# Patient Record
Sex: Female | Born: 1980
Health system: Southern US, Community
[De-identification: ages and names within clinical notes are randomized; demographics above are authoritative.]

## PROBLEM LIST (undated history)

## (undated) DIAGNOSIS — R7303 Prediabetes: Secondary | ICD-10-CM

## (undated) DIAGNOSIS — G43909 Migraine, unspecified, not intractable, without status migrainosus: Secondary | ICD-10-CM

## (undated) DIAGNOSIS — F4310 Post-traumatic stress disorder, unspecified: Secondary | ICD-10-CM

## (undated) DIAGNOSIS — N390 Urinary tract infection, site not specified: Secondary | ICD-10-CM

## (undated) DIAGNOSIS — L719 Rosacea, unspecified: Secondary | ICD-10-CM

## (undated) DIAGNOSIS — F419 Anxiety disorder, unspecified: Secondary | ICD-10-CM

## (undated) DIAGNOSIS — N809 Endometriosis, unspecified: Secondary | ICD-10-CM

## (undated) DIAGNOSIS — Z87442 Personal history of urinary calculi: Secondary | ICD-10-CM

## (undated) DIAGNOSIS — F32A Depression, unspecified: Secondary | ICD-10-CM

## (undated) DIAGNOSIS — Q793 Gastroschisis: Secondary | ICD-10-CM

## (undated) DIAGNOSIS — R112 Nausea with vomiting, unspecified: Secondary | ICD-10-CM

## (undated) DIAGNOSIS — N879 Dysplasia of cervix uteri, unspecified: Secondary | ICD-10-CM

## (undated) DIAGNOSIS — F53 Postpartum depression: Secondary | ICD-10-CM

## (undated) DIAGNOSIS — Z87898 Personal history of other specified conditions: Secondary | ICD-10-CM

## (undated) DIAGNOSIS — F429 Obsessive-compulsive disorder, unspecified: Secondary | ICD-10-CM

## (undated) DIAGNOSIS — Z9889 Other specified postprocedural states: Secondary | ICD-10-CM

## (undated) DIAGNOSIS — L409 Psoriasis, unspecified: Secondary | ICD-10-CM

## (undated) DIAGNOSIS — N200 Calculus of kidney: Secondary | ICD-10-CM

## (undated) DIAGNOSIS — O99345 Other mental disorders complicating the puerperium: Secondary | ICD-10-CM

## (undated) DIAGNOSIS — Z8701 Personal history of pneumonia (recurrent): Secondary | ICD-10-CM

## (undated) DIAGNOSIS — J189 Pneumonia, unspecified organism: Secondary | ICD-10-CM

## (undated) DIAGNOSIS — G4730 Sleep apnea, unspecified: Secondary | ICD-10-CM

## (undated) DIAGNOSIS — T7840XA Allergy, unspecified, initial encounter: Secondary | ICD-10-CM

## (undated) DIAGNOSIS — R011 Cardiac murmur, unspecified: Secondary | ICD-10-CM

## (undated) DIAGNOSIS — F329 Major depressive disorder, single episode, unspecified: Secondary | ICD-10-CM

## (undated) DIAGNOSIS — T8859XA Other complications of anesthesia, initial encounter: Secondary | ICD-10-CM

## (undated) DIAGNOSIS — B002 Herpesviral gingivostomatitis and pharyngotonsillitis: Secondary | ICD-10-CM

## (undated) HISTORY — DX: Personal history of other specified conditions: Z87.898

## (undated) HISTORY — DX: Calculus of kidney: N20.0

## (undated) HISTORY — DX: Psoriasis, unspecified: L40.9

## (undated) HISTORY — DX: Other mental disorders complicating the puerperium: O99.345

## (undated) HISTORY — DX: Personal history of pneumonia (recurrent): Z87.01

## (undated) HISTORY — DX: Rosacea, unspecified: L71.9

## (undated) HISTORY — DX: Cardiac murmur, unspecified: R01.1

## (undated) HISTORY — DX: Herpesviral gingivostomatitis and pharyngotonsillitis: B00.2

## (undated) HISTORY — DX: Dysplasia of cervix uteri, unspecified: N87.9

## (undated) HISTORY — DX: Endometriosis, unspecified: N80.9

## (undated) HISTORY — DX: Allergy, unspecified, initial encounter: T78.40XA

## (undated) HISTORY — DX: Migraine, unspecified, not intractable, without status migrainosus: G43.909

## (undated) HISTORY — PX: GASTROSCHISIS CLOSURE: SHX1700

## (undated) HISTORY — DX: Urinary tract infection, site not specified: N39.0

## (undated) HISTORY — PX: WISDOM TOOTH EXTRACTION: SHX21

## (undated) HISTORY — DX: Anxiety disorder, unspecified: F41.9

## (undated) HISTORY — DX: Gastroschisis: Q79.3

## (undated) HISTORY — PX: LEEP: SHX91

## (undated) HISTORY — DX: Depression, unspecified: F32.A

## (undated) HISTORY — DX: Postpartum depression: F53.0

---
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
## 2009-04-16 HISTORY — PX: ENDOMETRIAL BIOPSY: SHX622

## 2010-04-16 HISTORY — PX: ABDOMINAL SURGERY: SHX537

## 2016-01-31 DIAGNOSIS — Z8619 Personal history of other infectious and parasitic diseases: Secondary | ICD-10-CM | POA: Insufficient documentation

## 2016-01-31 DIAGNOSIS — Z8742 Personal history of other diseases of the female genital tract: Secondary | ICD-10-CM | POA: Insufficient documentation

## 2016-01-31 DIAGNOSIS — Z87442 Personal history of urinary calculi: Secondary | ICD-10-CM | POA: Insufficient documentation

## 2016-05-30 DIAGNOSIS — F4000 Agoraphobia, unspecified: Secondary | ICD-10-CM | POA: Insufficient documentation

## 2017-05-27 DIAGNOSIS — Z98891 History of uterine scar from previous surgery: Secondary | ICD-10-CM | POA: Insufficient documentation

## 2017-05-27 DIAGNOSIS — B009 Herpesviral infection, unspecified: Secondary | ICD-10-CM | POA: Insufficient documentation

## 2017-05-27 DIAGNOSIS — Z8279 Family history of other congenital malformations, deformations and chromosomal abnormalities: Secondary | ICD-10-CM | POA: Insufficient documentation

## 2017-05-27 DIAGNOSIS — O24410 Gestational diabetes mellitus in pregnancy, diet controlled: Secondary | ICD-10-CM | POA: Insufficient documentation

## 2017-05-27 DIAGNOSIS — G43109 Migraine with aura, not intractable, without status migrainosus: Secondary | ICD-10-CM | POA: Insufficient documentation

## 2017-05-27 DIAGNOSIS — G43009 Migraine without aura, not intractable, without status migrainosus: Secondary | ICD-10-CM | POA: Insufficient documentation

## 2019-01-23 ENCOUNTER — Encounter: Payer: Self-pay | Admitting: Family Medicine

## 2019-01-23 ENCOUNTER — Other Ambulatory Visit: Payer: Self-pay

## 2019-01-23 ENCOUNTER — Ambulatory Visit (INDEPENDENT_AMBULATORY_CARE_PROVIDER_SITE_OTHER): Payer: Managed Care, Other (non HMO) | Admitting: Family Medicine

## 2019-01-23 VITALS — BP 102/68 | HR 101 | Temp 97.5°F | Ht 60.25 in | Wt 191.8 lb

## 2019-01-23 DIAGNOSIS — Z6837 Body mass index (BMI) 37.0-37.9, adult: Secondary | ICD-10-CM

## 2019-01-23 DIAGNOSIS — F419 Anxiety disorder, unspecified: Secondary | ICD-10-CM

## 2019-01-23 DIAGNOSIS — F329 Major depressive disorder, single episode, unspecified: Secondary | ICD-10-CM

## 2019-01-23 DIAGNOSIS — E6609 Other obesity due to excess calories: Secondary | ICD-10-CM

## 2019-01-23 DIAGNOSIS — R61 Generalized hyperhidrosis: Secondary | ICD-10-CM

## 2019-01-23 DIAGNOSIS — Z0000 Encounter for general adult medical examination without abnormal findings: Secondary | ICD-10-CM

## 2019-01-23 DIAGNOSIS — F32A Depression, unspecified: Secondary | ICD-10-CM

## 2019-01-23 DIAGNOSIS — N920 Excessive and frequent menstruation with regular cycle: Secondary | ICD-10-CM

## 2019-01-23 DIAGNOSIS — R7982 Elevated C-reactive protein (CRP): Secondary | ICD-10-CM

## 2019-01-23 DIAGNOSIS — Z8632 Personal history of gestational diabetes: Secondary | ICD-10-CM | POA: Diagnosis not present

## 2019-01-23 DIAGNOSIS — Z23 Encounter for immunization: Secondary | ICD-10-CM

## 2019-01-23 DIAGNOSIS — E559 Vitamin D deficiency, unspecified: Secondary | ICD-10-CM

## 2019-01-23 LAB — HEMOGLOBIN A1C: Hgb A1c MFr Bld: 6.2 % (ref 4.6–6.5)

## 2019-01-23 LAB — COMPREHENSIVE METABOLIC PANEL
ALT: 15 U/L (ref 0–35)
AST: 13 U/L (ref 0–37)
Albumin: 4.4 g/dL (ref 3.5–5.2)
Alkaline Phosphatase: 68 U/L (ref 39–117)
BUN: 12 mg/dL (ref 6–23)
CO2: 26 mEq/L (ref 19–32)
Calcium: 9.2 mg/dL (ref 8.4–10.5)
Chloride: 104 mEq/L (ref 96–112)
Creatinine, Ser: 0.65 mg/dL (ref 0.40–1.20)
GFR: 102.76 mL/min (ref >60.00)
Glucose, Bld: 104 mg/dL — ABNORMAL HIGH (ref 70–99)
Potassium: 4.3 mEq/L (ref 3.5–5.1)
Sodium: 138 mEq/L (ref 135–145)
Total Bilirubin: 0.4 mg/dL (ref 0.2–1.2)
Total Protein: 7 g/dL (ref 6.0–8.3)

## 2019-01-23 LAB — CBC WITH DIFFERENTIAL/PLATELET
Basophils Absolute: 0 10*3/uL (ref 0.0–0.1)
Basophils Relative: 0.2 % (ref 0.0–3.0)
Eosinophils Absolute: 0.1 10*3/uL (ref 0.0–0.7)
Eosinophils Relative: 0.8 % (ref 0.0–5.0)
HCT: 37.3 % (ref 36.0–46.0)
Hemoglobin: 12.3 g/dL (ref 12.0–15.0)
Lymphocytes Relative: 35.8 % (ref 12.0–46.0)
Lymphs Abs: 3.6 10*3/uL (ref 0.7–4.0)
MCHC: 33.1 g/dL (ref 30.0–36.0)
MCV: 84.6 fl (ref 78.0–100.0)
Monocytes Absolute: 0.6 10*3/uL (ref 0.1–1.0)
Monocytes Relative: 6 % (ref 3.0–12.0)
Neutro Abs: 5.7 10*3/uL (ref 1.4–7.7)
Neutrophils Relative %: 57.2 % (ref 43.0–77.0)
Platelets: 306 10*3/uL (ref 150.0–400.0)
RBC: 4.41 Mil/uL (ref 3.87–5.11)
RDW: 13.2 % (ref 11.5–15.5)
WBC: 9.9 10*3/uL (ref 4.0–10.5)

## 2019-01-23 LAB — FERRITIN: Ferritin: 16.5 ng/mL (ref 10.0–291.0)

## 2019-01-23 LAB — HIGH SENSITIVITY CRP: CRP, High Sensitivity: 8.7 mg/L — ABNORMAL HIGH (ref 0.000–5.000)

## 2019-01-23 LAB — TSH: TSH: 1.69 u[IU]/mL (ref 0.35–4.50)

## 2019-01-23 LAB — VITAMIN D 25 HYDROXY (VIT D DEFICIENCY, FRACTURES): VITD: 20.06 ng/mL — ABNORMAL LOW (ref 30.00–100.00)

## 2019-01-23 NOTE — Patient Instructions (Addendum)
Good to meet you today  Please follow up in 6 months  A resource that I like is [email or web address].com/diabetes/diet  I recommend you check your blood sugar daily and keep a log.  Very the time you check your blood sugar such as fasting, before meal, 2 hours after a meal and at bedtime.  Look for trends with the foods you are eating and be a scientist of your body.  Here are some guidelines to help you with meal planning -  Avoid all processed and packaged foods (bread, pasta, crackers, chips, etc) and beverages containing calories.  Avoid added sugars and excessive natural sugars.  Attention to how you feel if you consume artificial sweeteners.  Do they make you more hungry or raise your blood sugar?  With every meal and snack, aim to get 20 g of protein (3 ounces of meat, 4 ounces of fish, 3 eggs, protein powder, 1 cup [place] yogurt, 1 cup cottage cheese, etc.)  Increase fiber in the form of non-starchy vegetables.  These help you feel full with very little carbohydrates and are good for gut health.  Eat 1 serving healthy carb per meal- 1/2 cup brown rice, beans, potato, corn- pay attention to whether or not this significantly raises your blood sugar. If it does, reduce the frequency you consume these.   Eat 2-3 servings of lower sugar fruits daily.  This includes berries, apples, oranges, peaches, pears, one half banana.  Have small amounts of good fats such as avocado, nuts, olive oil, nut butters, olives.  Add a little cheese to your salads to make them tasty.   How to help anxiety - without medication.   1) Regular Exercise - walking, jogging, cycling, dancing, strength training  2)  Begin a Mindfulness/Meditation practice -- this can take a little as 3 minutes and is helpful for all kinds of mood issues -- You can find resources in books -- Or you can download apps like  ---- Headspace App (which currently has free content called "Weathering the Storm") ---- Calm (which has a few free  options)  ---- Insignt Timer ---- Stop, Breathe & Think  # With each of these Apps - you should decline the "start free trial" offer and as you search through the App should be able to access some of their free content. You can also chose to pay for the content if you find one that works well for you.   # Many of them also offer sleep specific content which may help with insomnia  3) Healthy Diet -- Avoid or decrease Caffeine -- Avoid or decrease Alcohol -- Drink plenty of water, have a balanced diet -- Avoid cigarettes and marijuana (as well as other recreational drugs)  4) Consider contacting a professional therapist

## 2019-01-23 NOTE — Addendum Note (Signed)
Addended by: Virl Cagey on: 01/23/2019 11:04 AM   Modules accepted: Orders

## 2019-01-23 NOTE — Progress Notes (Signed)
Subjective:    Patient ID: Crystal Hammond, Crystal Hammond    DOB: Feb 28, 1981, 38 y.o.   MRN: [id number]  HPI Chief Complaint  Patient presents with  . New Patient (Initial Visit)    Referrals for hormone issues - hot flashes, sweating and mood swings.     This is a 38 yo Crystal Hammond who presents today to establish care. She is accompanied by her toddler daughter. Previously was seen at [facility]. Has 4 children, aged 12 to 64.  Married.  Enjoys taking hot baths.  Last CPE- over 1 year Pap- wants to go to gyn Tdap- Flu- Eye- this year Dental- over due Exercise- household chores Diet- eats a wide variety of foods.  May consider gastric surgery in the future.  Has tried many diets in the past including weight watchers, counting calories and decreasing portions and soda.  Eats dessert at least once a day and always has. Sleep- 6-8 hours, usually interupted  Depression and anxiety- long standing. Dx PMDD. Increased anxiety.  1 of her sons was attacked by a pit bull about 2 years ago and she has PTSD surrounding this event.  She reports that she had been on medication for depression in the past and gained significant amounts of weight that she has never been able to lose.  She is not interested in going on medication at this time.  She has had therapy in the past and found that very helpful and is interested in therapy again.   Snoring and drooling at night. No known apnea.  Thinks this is related to her weight.  Hot flashes- several years. During day and at night.    Past Medical History:  Diagnosis Date  . Anxiety   . Depression   . 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 Heart murmur   . History of febrile seizure   . History of pneumonia   . Migraines   . Recurrent UTI    Past Surgical History:  Procedure Laterality Date  . ABDOMINAL SURGERY  2012   Endometrial Surgery - went through c-section scar  . CESAREAN SECTION  2019  . CESAREAN SECTION  2007  . CESAREAN SECTION  2014  . CESAREAN  SECTION  2016  . GASTROSCHISIS CLOSURE    . WISDOM TOOTH EXTRACTION    No family history on file. Social History   Tobacco Use  . Smoking status: Former Smoker    Packs/day: 0.30    Years: 3.00    Pack years: 0.90    Types: Cigarettes    Quit date: 2013    Years since quitting: 7.7  . Smokeless tobacco: Never Used  Substance Use Topics  . Alcohol use: Yes    Alcohol/week: 2.0 standard drinks    Types: 2 Cans of beer per week  . Drug use: Never    .   Review of Systems  Constitutional: Positive for fatigue.       Sweats  HENT: Positive for drooling (during sleep).   Respiratory: Positive for chest tightness (for long time, she suspects from bronchitis as a child. ) and shortness of breath (with activitiy). Negative for wheezing.   Cardiovascular: Negative.   Gastrointestinal: Positive for diarrhea (with dairy). Negative for abdominal pain and constipation.  Endocrine: Positive for heat intolerance.  Genitourinary: Positive for menstrual problem and vaginal bleeding.  Allergic/Immunologic: Positive for food allergies (lactose intolerant. ).  Neurological: Positive for headaches (history of migraines without aura, usually 2 per month. Photo and phonosensitive. ).  Hematological:  Negative.   Psychiatric/Behavioral: Positive for dysphoric mood and sleep disturbance. The patient is nervous/anxious.        Objective:   Physical Exam Vitals signs reviewed.  Constitutional:      General: She is not in acute distress.    Appearance: Normal appearance. She is obese. She is not ill-appearing, toxic-appearing or diaphoretic.  HENT:     Head: Normocephalic and atraumatic.     Right Ear: External ear normal.     Left Ear: External ear normal.  Eyes:     Conjunctiva/sclera: Conjunctivae normal.  Neck:     Musculoskeletal: Normal range of motion and neck supple. No neck rigidity or muscular tenderness.  Cardiovascular:     Rate and Rhythm: Normal rate and regular rhythm.      Heart sounds: Normal heart sounds.  Pulmonary:     Effort: Pulmonary effort is normal.     Breath sounds: Normal breath sounds.  Chest:     Breasts: Breasts are symmetrical.        Right: Normal.        Left: Normal.  Abdominal:     General: There is no distension.     Palpations: Abdomen is soft. There is no mass.     Tenderness: There is no abdominal tenderness. There is no guarding or rebound.     Hernia: No hernia is present.     Comments: Well-healed horizontal C-section scar.  Musculoskeletal: Normal range of motion.     Left lower leg: No edema.  Lymphadenopathy:     Cervical: No cervical adenopathy.  Skin:    General: Skin is warm and dry.  Neurological:     Mental Status: She is alert and oriented to person, place, and time.  Psychiatric:        Mood and Affect: Mood normal.        Behavior: Behavior normal.        Thought Content: Thought content normal.        Judgment: Judgment normal.      Pulse (!) 101   Temp (!) 97.5 F (36.4 C) (Temporal)   Ht 5' 0.25" (1.53 m)   Wt 191 lb 12.8 oz (87 kg)   SpO2 97%   BMI 37.15 kg/m   Depression screen PHQ 2/9 01/23/2019  Decreased Interest 1  Down, Depressed, Hopeless 1  PHQ - 2 Score 2  Altered sleeping 0  Tired, decreased energy 2  Change in appetite 1  Feeling bad or failure about yourself  3  Trouble concentrating 2  Moving slowly or fidgety/restless 0  Suicidal thoughts 0  PHQ-9 Score 10  Difficult doing work/chores Very difficult       Assessment & Plan:  1. Annual physical exam - Discussed and encouraged healthy lifestyle choices- adequate sleep, regular exercise, stress management and healthy food choices.   2. Night sweats - CBC with Differential - Comprehensive metabolic panel - TSH - HIV Antibody (routine testing w rflx) - High sensitivity CRP  3. Menorrhagia with regular cycle - CBC with Differential - Comprehensive metabolic panel - TSH - Ferritin - Ambulatory referral to Gynecology   4. History of gestational diabetes - Hemoglobin A1c  5. Class 2 obesity due to excess calories without serious comorbidity with body mass index (BMI) of 37.0 to 37.9 in adult -Provided information for fat loss diet and encouraged her to make healthy food choices and walk daily.  Discussed role of poor sleep and high stress on attempts at weight  loss - Vitamin D, 25-hydroxy  6. Anxiety and depression -She is adamant about not taking medication at this time.  Provided her information to schedule therapy appointment as well as information about nonmedication interventions for anxiety  -Follow-up in 6 months   Olean Reeeborah Eilish Mcdaniel, FNP-BC  [facility], [facility]  01/23/2019 10:38 AM

## 2019-01-24 LAB — HIV ANTIBODY (ROUTINE TESTING W REFLEX): HIV 1&2 Ab, 4th Generation: NONREACTIVE

## 2019-01-26 MED ORDER — VITAMIN D3 1.25 MG (50000 UT) PO TABS: 1.0000 | ORAL_TABLET | ORAL | 1 refills | Status: DC

## 2019-01-26 NOTE — Addendum Note (Signed)
Addended by: Clarene Reamer B on: 01/26/2019 07:59 AM   Modules accepted: Orders

## 2019-01-27 ENCOUNTER — Encounter: Payer: Self-pay | Admitting: Family Medicine

## 2019-01-28 ENCOUNTER — Other Ambulatory Visit: Payer: Self-pay | Admitting: Family Medicine

## 2019-01-28 DIAGNOSIS — Z6837 Body mass index (BMI) 37.0-37.9, adult: Secondary | ICD-10-CM

## 2019-01-28 DIAGNOSIS — R7303 Prediabetes: Secondary | ICD-10-CM

## 2019-01-28 DIAGNOSIS — E6609 Other obesity due to excess calories: Secondary | ICD-10-CM

## 2019-01-28 DIAGNOSIS — Z8632 Personal history of gestational diabetes: Secondary | ICD-10-CM

## 2019-02-02 NOTE — Patient Instructions (Signed)
I value your feedback and entrusting us with your care. If you get a [facility] patient survey, I would appreciate you taking the time to let us know about your experience today. Thank you! 

## 2019-02-02 NOTE — Progress Notes (Signed)
Crystal Belfast, FNP   Chief Complaint  Patient presents with  . Menorrhagia    heavy cycles since last sept, cycles are irregular, hot flashes    HPI:      Crystal Hammond is a 38 y.o. No obstetric history on file. who LMP was Patient's last menstrual period was 01/27/2019 (approximate)., presents today for NP ref for menorrhagia, referred by PCP. Pt is 13 months PP and still breastfeeding 5-7 times daily. Baby doesn't want to wean off but pt is ready to be done. Has periods every couple of months, lasting 3-7 days, all heavy, changing pads Q1 hrs, with clots. Pt feels like she can't leave house due to flow. Has mild dsymen. Hx of heavy menses in past prior to pregnancy. Had Mirena with amenorrhea and loved it. Had 2nd one that was embedded and had to be removed. Pt hesitant about another one. Tried to have IUD placed PP but was told her uterus was still too large. Normal CBC, CMP, TSH 10/20 with PCP.  FH leio in her mom.   Pt also with decreased libido before and after this pregnancy. Very frustrating to her.  Pt with inability to lose pregnancy wt. Hx of PP depression with prior pregnancies. Treated with SSRIs in past with wt gain which exacerbated pt's depression sx. Hasn't been able to lose wt. Feels like she needs PP depression tx but doesn't want meds since still breastfeeding.  Pt also with hot flashes/night sweats. Had normal labs with PCP 10/20.  No recent pap. Pt with hx of dysplasia and 2 LEEPs in past. No recent paps since.   Pt is infrequently sex active, using condoms/husband with vasectomy but never submitted f/u specimen.   Past Medical History:  Diagnosis Date  . Anxiety   . Cervical dysplasia   . Depression   . Endometriosis   . 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 Heart murmur   . History of febrile seizure   . History of pneumonia   . Migraines   . Oral herpes   . Postpartum depression   . Recurrent UTI     Past Surgical History:  Procedure Laterality  Date  . ABDOMINAL SURGERY  2012   Endometrial Surgery - went through c-section scar  . CESAREAN SECTION  2019  . CESAREAN SECTION  2007  . CESAREAN SECTION  2014  . CESAREAN SECTION  2016  . GASTROSCHISIS CLOSURE    . LEEP     x 2  . WISDOM TOOTH EXTRACTION      Family History  Problem Relation Age of Onset  . Breast cancer Neg Hx   . Ovarian cancer Neg Hx     Social History   Socioeconomic History  . Marital status: Married    Spouse name: Not on file  . Number of children: Not on file  . Years of education: Not on file  . Highest education level: Not on file  Occupational History  . Not on file  Social Needs  . Financial resource strain: Not on file  . Food insecurity    Worry: Not on file    Inability: Not on file  . Transportation needs    Medical: Not on file    Non-medical: Not on file  Tobacco Use  . Smoking status: Former Smoker    Packs/day: 0.30    Years: 3.00    Pack years: 0.90    Types: Cigarettes    Quit date: 2013  Years since quitting: 7.8  . Smokeless tobacco: Never Used  Substance and Sexual Activity  . Alcohol use: Yes    Alcohol/week: 2.0 standard drinks    Types: 2 Cans of beer per week  . Drug use: Never  . Sexual activity: Yes    Birth control/protection: None  Lifestyle  . Physical activity    Days per week: Not on file    Minutes per session: Not on file  . Stress: Not on file  Relationships  . Social Herbalist on phone: Not on file    Gets together: Not on file    Attends religious service: Not on file    Active member of club or organization: Not on file    Attends meetings of clubs or organizations: Not on file    Relationship status: Not on file  . Intimate partner violence    Fear of current or ex partner: Not on file    Emotionally abused: Not on file    Physically abused: Not on file    Forced sexual activity: Not on file  Other Topics Concern  . Not on file  Social History Narrative  . Not on file     Outpatient Medications Prior to Visit  Medication Sig Dispense Refill  . Cholecalciferol (VITAMIN D3) 1.25 MG (50000 UT) TABS Take 1 tablet by mouth every 7 (seven) days. 12 tablet 1   No facility-administered medications prior to visit.       ROS:  Review of Systems  Constitutional: Negative for fatigue, fever and unexpected weight change.  Respiratory: Negative for cough, shortness of breath and wheezing.   Cardiovascular: Negative for chest pain, palpitations and leg swelling.  Gastrointestinal: Negative for blood in stool, constipation, diarrhea, nausea and vomiting.  Endocrine: Negative for cold intolerance, heat intolerance and polyuria.  Genitourinary: Positive for menstrual problem. Negative for dyspareunia, dysuria, flank pain, frequency, genital sores, hematuria, pelvic pain, urgency, vaginal bleeding, vaginal discharge and vaginal pain.  Musculoskeletal: Negative for back pain, joint swelling and myalgias.  Skin: Negative for rash.  Neurological: Positive for headaches. Negative for dizziness, syncope, light-headedness and numbness.  Hematological: Negative for adenopathy.  Psychiatric/Behavioral: Positive for agitation and dysphoric mood. Negative for confusion, sleep disturbance and suicidal ideas. The patient is not nervous/anxious.   BREAST: No symptoms   OBJECTIVE:   Vitals:  BP 110/80   Ht 5' (1.524 m)   Wt 191 lb (86.6 kg)   LMP 01/27/2019 (Approximate)   BMI 37.30 kg/m   Physical Exam Vitals signs reviewed.  Constitutional:      Appearance: She is well-developed.  Neck:     Musculoskeletal: Normal range of motion.  Pulmonary:     Effort: Pulmonary effort is normal.  Genitourinary:    General: Normal vulva.     Pubic Area: No rash.      Labia:        Right: No rash, tenderness or lesion.        Left: No rash, tenderness or lesion.      Vagina: Normal. No vaginal discharge, erythema or tenderness.     Cervix: Normal.     Uterus: Normal. Not  enlarged and not tender.      Adnexa: Right adnexa normal and left adnexa normal.       Right: No mass or tenderness.         Left: No mass or tenderness.    Musculoskeletal: Normal range of motion.  Skin:  General: Skin is warm and dry.  Neurological:     General: No focal deficit present.     Mental Status: She is alert and oriented to person, place, and time.  Psychiatric:        Mood and Affect: Mood normal.        Behavior: Behavior normal.        Thought Content: Thought content normal.        Judgment: Judgment normal.     Results:  ULTRASOUND REPORT  Location: [facility]  Date of Service: 02/03/2019    Indications:Abnormal Uterine Bleeding Findings:  The uterus is retroflexed and measures 10.7 x 4.5 x 4.1 cm. Echo texture is homogenous without evidence of focal masses. The Endometrium measures 4.3 mm.  Right Ovary measures 3.7 x 3.0 x 2.1 cm. It is normal in appearance. Left Ovary measures 2.3 x 1.3 x 1.7 cm. It is normal in appearance. Survey of the adnexa demonstrates no adnexal masses. There is no free fluid in the cul de sac.  Impression: 1. Normal pelvic ultrasound.   Recommendations: 1.Clinical correlation with the patient's History and Physical Exam.  Deanna ArtisElyse S Fairbanks, RT   Assessment/Plan: Menorrhagia with irregular cycle - Plan: US PELVIS TRANSVAGINAL NON-OB (TV ONLY); Normal GYN u/s. Discussed cycle irregularity due to BF, but given hx of menorrhalgia, discussed IUD and BC options for cycle control/relief. Pt to consider and f/u prn.  Cervical cancer screening - Plan: Cytology - PAP  Screening for HPV (human papillomavirus) - Plan: Cytology - PAP  Postpartum depression--Discussed trying wellbutrin and anxiety meds since had wt gain with SSRIs that was very upsetting to pt. Pt to consider. Concerned about meds with BF.   Decreased libido--Probably related to breastfeeding, 4 kids, and PP depression sx. May improve with BF  cessation/PP depression tx.     Return if symptoms worsen or fail to improve.  Alicia B. Copland, PA-C 02/03/2019 12:36 PM

## 2019-02-03 ENCOUNTER — Encounter: Payer: Self-pay | Admitting: Obstetrics and Gynecology

## 2019-02-03 ENCOUNTER — Ambulatory Visit (INDEPENDENT_AMBULATORY_CARE_PROVIDER_SITE_OTHER): Payer: Managed Care, Other (non HMO) | Admitting: Obstetrics and Gynecology

## 2019-02-03 ENCOUNTER — Other Ambulatory Visit: Payer: Self-pay

## 2019-02-03 ENCOUNTER — Other Ambulatory Visit (HOSPITAL_COMMUNITY)
Admission: RE | Admit: 2019-02-03 | Discharge: 2019-02-03 | Disposition: A | Discharge status: Home / Self Care | Payer: Managed Care, Other (non HMO) | Source: Ambulatory Visit | Attending: Obstetrics and Gynecology | Admitting: Obstetrics and Gynecology

## 2019-02-03 ENCOUNTER — Ambulatory Visit (INDEPENDENT_AMBULATORY_CARE_PROVIDER_SITE_OTHER): Payer: Managed Care, Other (non HMO)

## 2019-02-03 VITALS — BP 110/80 | Ht 60.0 in | Wt 191.0 lb

## 2019-02-03 DIAGNOSIS — O99345 Other mental disorders complicating the puerperium: Secondary | ICD-10-CM | POA: Diagnosis not present

## 2019-02-03 DIAGNOSIS — Z1151 Encounter for screening for human papillomavirus (HPV): Secondary | ICD-10-CM | POA: Diagnosis present

## 2019-02-03 DIAGNOSIS — N921 Excessive and frequent menstruation with irregular cycle: Secondary | ICD-10-CM | POA: Diagnosis not present

## 2019-02-03 DIAGNOSIS — F53 Postpartum depression: Secondary | ICD-10-CM

## 2019-02-03 DIAGNOSIS — R6882 Decreased libido: Secondary | ICD-10-CM

## 2019-02-03 DIAGNOSIS — Z124 Encounter for screening for malignant neoplasm of cervix: Secondary | ICD-10-CM

## 2019-02-03 DIAGNOSIS — Z30014 Encounter for initial prescription of intrauterine contraceptive device: Secondary | ICD-10-CM | POA: Diagnosis not present

## 2019-02-05 LAB — CYTOLOGY - PAP
Comment: NEGATIVE
Diagnosis: NEGATIVE
High risk HPV: NEGATIVE

## 2019-02-09 ENCOUNTER — Telehealth: Payer: Self-pay

## 2019-02-09 NOTE — Telephone Encounter (Signed)
Patient left a message on triage line stating that her husband was exposed at work to someone who past positive for COVID (husband works in a Physicist, medical). Patient wanted to see if she and her kids should be tested as a precaution and what should she look out for as far as symptoms or concerns. Please review.

## 2019-02-09 NOTE — Telephone Encounter (Signed)
Called and spoke with patient. Her husband has not had any symptoms and will not be tested until the end of the week. No one in home with symptoms. Patient was instructed to get tested if she develops symptoms (reviewed with patient) or husband has positive test.

## 2019-02-13 ENCOUNTER — Other Ambulatory Visit: Payer: Self-pay

## 2019-02-13 DIAGNOSIS — Z20822 Contact with and (suspected) exposure to covid-19: Secondary | ICD-10-CM

## 2019-02-15 LAB — NOVEL CORONAVIRUS, NAA: SARS-CoV-2, NAA: NOT DETECTED

## 2019-02-16 ENCOUNTER — Telehealth: Payer: Self-pay | Admitting: General Practice

## 2019-02-16 NOTE — Telephone Encounter (Signed)
Negative COVID results given. Patient results "NOT Detected." Caller expressed understanding. ° °

## 2019-02-18 ENCOUNTER — Other Ambulatory Visit: Payer: Self-pay

## 2019-02-18 DIAGNOSIS — Z20822 Contact with and (suspected) exposure to covid-19: Secondary | ICD-10-CM

## 2019-02-19 LAB — NOVEL CORONAVIRUS, NAA: SARS-CoV-2, NAA: DETECTED — AB

## 2019-02-20 ENCOUNTER — Ambulatory Visit (INDEPENDENT_AMBULATORY_CARE_PROVIDER_SITE_OTHER): Payer: Managed Care, Other (non HMO) | Admitting: Family Medicine

## 2019-02-20 ENCOUNTER — Encounter: Payer: Self-pay | Admitting: Family Medicine

## 2019-02-20 ENCOUNTER — Encounter (INDEPENDENT_AMBULATORY_CARE_PROVIDER_SITE_OTHER): Payer: Self-pay

## 2019-02-20 ENCOUNTER — Telehealth: Payer: Self-pay

## 2019-02-20 VITALS — HR 77 | Temp 98.7°F

## 2019-02-20 DIAGNOSIS — R0789 Other chest pain: Secondary | ICD-10-CM

## 2019-02-20 DIAGNOSIS — R112 Nausea with vomiting, unspecified: Secondary | ICD-10-CM

## 2019-02-20 DIAGNOSIS — U071 COVID-19: Secondary | ICD-10-CM

## 2019-02-20 MED ORDER — PROMETHAZINE HCL 12.5 MG PO TABS: 12.5000 mg | ORAL_TABLET | Freq: Three times a day (TID) | ORAL | 0 refills | Status: DC | PRN

## 2019-02-20 MED ORDER — ALBUTEROL SULFATE HFA 108 (90 BASE) MCG/ACT IN AERS: 2.0000 | INHALATION_SPRAY | RESPIRATORY_TRACT | 0 refills | Status: DC | PRN

## 2019-02-20 NOTE — Progress Notes (Signed)
Virtual Visit via Video Note  I connected with Crystal Hammond on 02/20/19 at 09:50 AM EST by a video enabled telemedicine application and verified that I am speaking with the correct person using two identifiers.  Location: Patient: [name] Provider: [facility], also present Kathe Becton, NP student   I discussed the limitations of evaluation and management by telemedicine and the availability of in person appointments. The patient expressed understanding and agreed to proceed.  History of Present Illness: Chief Complaint  Patient presents with  . Cough    x 1 week... neg Covid 02/13/2019---test positive 02/18/2019....chest tightness... intermittent SOB... loss of taste and smell  . Generalized Body Aches    pt is breast feeding, taking Tylenol and ibuprofen  . Chills    sweats, no fever  . Otalgia    bilateral  . Fatigue  . Emesis   This is a 38 year old patient who requests virtual visit today to discuss symptoms of COVID-19.  She began having symptoms on 02/13/2019 and had a negative test.  Her symptoms worsened and she was retested on 02/18/2019 with a positive test.  Her husband is positive for Covid.  Her 4 children have not been sick.  The patient has been having nausea and vomiting, bilateral ear pain, constant headache, her chest feels tight, dry cough.  She has not been running a fever but has been having sweats.  She has been able to keep some food and fluid down.  She has only been taking Tylenol as she is breast-feeding and was not sure what she could take.  She has not heard wheeze nor does she feel like she cannot breathe.   Observations/Objective: The patient is alert and answers questions appropriately.  She appears ill.  Her respirations are even and unlabored and she is able to converse in complete sentences.  No audible wheeze or witnessed cough. Pulse 77   Temp 98.7 F (37.1 C) (Oral)   LMP 01/27/2019 (Approximate)   SpO2 96%  She was on a pulse ox at  home so that is good  Assessment and Plan: 1. Non-intractable vomiting with nausea, unspecified vomiting type -Encouraged her to eat bland diet and consume adequate fluids -Discussed potential side effects of taking promethazine with breast-feeding and encouraged her to take medication right after breast-feeding - promethazine (PHENERGAN) 12.5 MG tablet; Take 1 tablet (12.5 mg total) by mouth every 8 (eight) hours as needed for nausea or vomiting.  Dispense: 20 tablet; Refill: 0  2. COVID-19 virus detected -Reviewed quarantine guidelines and emergency room/911 precautions - MYCHART COVID-19 HOME MONITORING PROGRAM - Temperature monitoring; Future  3. Chest tightness - albuterol (VENTOLIN HFA) 108 (90 Base) MCG/ACT inhaler; Inhale 2 puffs into the lungs every 4 (four) hours as needed for wheezing or shortness of breath (cough, shortness of breath or wheezing.).  Dispense: 18 g; Refill: 0   Clarene Reamer, FNP-BC  [facility] [place]  02/20/2019 10:11 AM  Visit recap sent to patient via MyChart including return to clinic/ER/urgent care precautions.

## 2019-02-20 NOTE — Telephone Encounter (Signed)
Patient advise on change in appetite per protocol:   If appetite becomes worse: encourage patient to drink fluids as tolerated, work their way up to bland solid food such as crackers, pretzels, soup, bread or applesauce and boiled starches.   If patient is unable to tolerate any foods or liquids, notify PCP.   IF PATIENT DEVELOPS SEVERE VOMITING (MORE THAN 6 TIMES A DAY AND OR >8 HOURS) AND/OR SEVERE ABDOMINAL PAIN ADVISE PATIENT TO CALL 911 AND SEEK TREATMENT IN ED  Patient states that she had a biscuit and scrambled eggs and was unable to hold it down.  Patient has been prescribed medication from pcp.

## 2019-02-21 ENCOUNTER — Encounter (INDEPENDENT_AMBULATORY_CARE_PROVIDER_SITE_OTHER): Payer: Self-pay

## 2019-02-21 ENCOUNTER — Telehealth: Payer: Self-pay

## 2019-02-21 NOTE — Telephone Encounter (Signed)
Called pt to discuss worsening cough. LM on VM to call back.

## 2019-02-23 ENCOUNTER — Encounter (INDEPENDENT_AMBULATORY_CARE_PROVIDER_SITE_OTHER): Payer: Self-pay

## 2019-02-24 ENCOUNTER — Encounter (INDEPENDENT_AMBULATORY_CARE_PROVIDER_SITE_OTHER): Payer: Self-pay

## 2019-02-26 ENCOUNTER — Encounter (INDEPENDENT_AMBULATORY_CARE_PROVIDER_SITE_OTHER): Payer: Self-pay

## 2019-02-26 ENCOUNTER — Telehealth: Payer: Self-pay

## 2019-02-26 NOTE — Telephone Encounter (Signed)
Patient advise on cough and diarrhea per protocol:    If cough remains the same or better: continue to treat with over the counter medications. Hard candy or cough drops and drinking warm fluids. Adults can also use honey 2 tsp (10 ML) at bedtime.   HONEY IS NOT RECOMMENDED FOR INFANTS UNDER ONE.   If cough is becoming worse even with the use of over the counter medications and patient is not able to sleep at night, cough becomes productive with sputum that maybe yellow or green in color, contact PCP.    If diarrhea remains the same: encourage patient to drink oral fluids and bland foods.   Avoid alcohol, spicy foods, caffeine or fatty foods that could make diarrhea worse.   Continue to monitor for signs of dehydration (increased thirst decreased urine output, yellow urine, dry skin, headache or dizziness).   Advise patient to try OTC medication (Imodium, kaopectate, Pepto-Bismol) as per manufacturer's instructions.    If worsening diarrhea occurs and becomes severe (6-7 bowel movements a day): notify PCP   If diarrhea last greater than 7 days: notify PCP   IF SIGNS OF DEHYDRATION OCCUR (INCREASED THIRST, DECREASED URINE OUTPUT, YELLOW URINE, DRY SKIN, HEADACHE OR DIZZINESS) ADVISE PATIENT TO CALL 911 AND SEEK TREATMENT IN THE ED  Patient states that she is still breast feeding and will contact her pcp to see what she can take.

## 2019-03-02 ENCOUNTER — Encounter (INDEPENDENT_AMBULATORY_CARE_PROVIDER_SITE_OTHER): Payer: Self-pay

## 2019-03-04 ENCOUNTER — Ambulatory Visit: Payer: Managed Care, Other (non HMO) | Admitting: Skilled Nursing Facility1

## 2019-03-09 ENCOUNTER — Encounter: Payer: Self-pay | Admitting: Family Medicine

## 2019-03-09 ENCOUNTER — Other Ambulatory Visit: Payer: Self-pay

## 2019-03-09 ENCOUNTER — Ambulatory Visit (INDEPENDENT_AMBULATORY_CARE_PROVIDER_SITE_OTHER): Payer: Managed Care, Other (non HMO) | Admitting: Family Medicine

## 2019-03-09 VITALS — HR 86 | Temp 97.6°F | Ht 60.0 in | Wt 190.0 lb

## 2019-03-09 DIAGNOSIS — J22 Unspecified acute lower respiratory infection: Secondary | ICD-10-CM | POA: Diagnosis not present

## 2019-03-09 MED ORDER — ALBUTEROL SULFATE (2.5 MG/3ML) 0.083% IN NEBU: 2.5000 mg | INHALATION_SOLUTION | Freq: Four times a day (QID) | RESPIRATORY_TRACT | 1 refills | Status: DC | PRN

## 2019-03-09 MED ORDER — PREDNISONE 10 MG PO TABS: ORAL_TABLET | ORAL | 0 refills | Status: DC

## 2019-03-09 MED ORDER — AZITHROMYCIN 250 MG PO TABS: ORAL_TABLET | ORAL | 0 refills | Status: DC

## 2019-03-09 MED ORDER — ADULT MASK DEVI: 1.0000 [IU] | 0 refills | Status: DC | PRN

## 2019-03-09 NOTE — Progress Notes (Signed)
Virtual Visit via Video Note  I connected with Crystal Hammond on 03/09/19 at 11:00 AM EST by a video enabled telemedicine application and verified that I am speaking with the correct person using two identifiers.  Location: Patient: in her home Provider: [place]   I discussed the limitations of evaluation and management by telemedicine and the availability of in person appointments. The patient expressed understanding and agreed to proceed.  History of Present Illness: Chief Complaint  Patient presents with  . Covid    Tested POS for Covid 11/4, quarantined x 10 days following POS result -- sx's started again x 3-4 days ago, worsening over last 2 days. Denies fever. C/o Baody aches, HA, vomiting, chest tightness(HFA not working), sore throat from vomiting and chest congestion.    This is a 38 yo female who presents for virtual visit with above cc.  She had a virtual visit with me 02/20/2019 for positive Covid test with chest tightness and shortness of breath.  She reports that her symptoms eventually improved but over the last couple of days she has had increased cough, chest pressure and pain and shortness of breath.  She has been using her albuterol inhaler without significant relief.  Headaches have improved but do continue as to myalgias.  She has not had a fever throughout her entire illness.  She vomited 3 times yesterday but was not vomiting prior and has been able to keep down toast today.  She reports good oral intake.  She has a pulse oximeter at home and today was 97%.  She is breast-feeding her toddler daughter.  She does have nebulizer machine at home but no solution.  She reports that she feels bad but not bad enough to go to the emergency room.  Past Medical History:  Diagnosis Date  . Anxiety   . Cervical dysplasia   . Depression   . Endometriosis   . 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 Heart murmur   . History of febrile seizure   . History of pneumonia   . Migraines    . Oral herpes   . Postpartum depression   . Recurrent UTI    Past Surgical History:  Procedure Laterality Date  . ABDOMINAL SURGERY  2012   Endometrial Surgery - went through c-section scar  . CESAREAN SECTION  2019  . CESAREAN SECTION  2007  . CESAREAN SECTION  2014  . CESAREAN SECTION  2016  . GASTROSCHISIS CLOSURE    . LEEP     x 2  . WISDOM TOOTH EXTRACTION     Family History  Problem Relation Age of Onset  . Breast cancer Neg Hx   . Ovarian cancer Neg Hx    Social History   Tobacco Use  . Smoking status: Former Smoker    Packs/day: 0.30    Years: 3.00    Pack years: 0.90    Types: Cigarettes    Quit date: 2013    Years since quitting: 7.8  . Smokeless tobacco: Never Used  Substance Use Topics  . Alcohol use: Yes    Alcohol/week: 2.0 standard drinks    Types: 2 Cans of beer per week  . Drug use: Never      Observations/Objective: Patient is alert and answers questions appropriately.  Visible skin is unremarkable.  She looks fatigued but in no acute distress.  Respirations are even and unlabored without audible wheeze or witnessed cough.  There is no increased work of breathing.  Her mood and  affect are appropriate. Pulse 86   Temp 97.6 F (36.4 C) (Oral)   Ht 5' (1.524 m)   Wt 190 lb (86.2 kg)   SpO2 97%   BMI 37.11 kg/m  Wt Readings from Last 3 Encounters:  03/09/19 190 lb (86.2 kg)  02/03/19 191 lb (86.6 kg)  01/23/19 191 lb 12.8 oz (87 kg)    Assessment and Plan: 1. Lower respiratory infection -Concern for secondary infection following COVID-19.  She had improvement of symptoms and now with worsening.  Discussed with her and possible pneumonia.  She has a history of pneumonia and we will go ahead and treat her with azithromycin and prednisone.  Reviewed implications with breast-feeding and possible effects on child of diarrhea.  Discussed waiting 4 hours to breast-feed after taking prednisone. -We will have her use nebulizer treatments to see if  this will improve chest congestion and cough. Parke Simmers diet and adequate fluids were encouraged -ER precautions reviewed.  Patient to let me know if she does not feel like she is having any improvement in 48 hours ahead of Thanksgiving holiday. - azithromycin (ZITHROMAX) 250 MG tablet; Take 2 tabs PO x 1 dose, then 1 tab PO QD x 4 days  Dispense: 6 tablet; Refill: 0 - predniSONE (DELTASONE) 10 MG tablet; Take 4 x 2 days, 3 x 2 days, 2 x 2 days, 1 x 2 days  Dispense: 20 tablet; Refill: 0 - albuterol (PROVENTIL) (2.5 MG/3ML) 0.083% nebulizer solution; Take 3 mLs (2.5 mg total) by nebulization every 6 (six) hours as needed for wheezing or shortness of breath.  Dispense: 90 mL; Refill: 1 - Respiratory Therapy Supplies (ADULT MASK) DEVI; 1 Units by Does not apply route as needed.  Dispense: 1 each; Refill: 0   Olean Ree, FNP-BC  [facility], [facility]  03/09/2019 11:46 AM   Follow Up Instructions:    I discussed the assessment and treatment plan with the patient. The patient was provided an opportunity to ask questions and all were answered. The patient agreed with the plan and demonstrated an understanding of the instructions.   The patient was advised to call back or seek an in-person evaluation if the symptoms worsen or if the condition fails to improve as anticipated.    Emi Belfast, FNP

## 2019-03-30 ENCOUNTER — Encounter: Payer: Self-pay | Admitting: Family Medicine

## 2019-03-30 ENCOUNTER — Other Ambulatory Visit: Payer: Self-pay

## 2019-03-30 ENCOUNTER — Ambulatory Visit (INDEPENDENT_AMBULATORY_CARE_PROVIDER_SITE_OTHER): Payer: Managed Care, Other (non HMO) | Admitting: Family Medicine

## 2019-03-30 VITALS — HR 109 | Temp 98.0°F | Ht 60.0 in | Wt 190.0 lb

## 2019-03-30 DIAGNOSIS — U071 COVID-19: Secondary | ICD-10-CM

## 2019-03-30 DIAGNOSIS — J4541 Moderate persistent asthma with (acute) exacerbation: Secondary | ICD-10-CM | POA: Diagnosis not present

## 2019-03-30 MED ORDER — QVAR REDIHALER 40 MCG/ACT IN AERB: 2.0000 | INHALATION_SPRAY | Freq: Two times a day (BID) | RESPIRATORY_TRACT | 1 refills | Status: DC

## 2019-03-30 NOTE — Progress Notes (Signed)
Virtual Visit via Video Note  I connected with Crystal Hammond on 03/30/19 at 10:15 AM EST by a video enabled telemedicine application and verified that I am speaking with the correct person using two identifiers.  Location: Patient: [name] Provider: [facility]   I discussed the limitations of evaluation and management by telemedicine and the availability of in person appointments. The patient expressed understanding and agreed to proceed.  History of Present Illness: Chief Complaint  Patient presents with  . Shortness of Breath    Pt states that she is still having SOB, wheezing, muscles aches and not feeling well. Pt states that her family has mentioned to her that her breathing sounds bad. Dry cough. Denies fever, congestion. Pt notes some chest tightness -constant.    This is a 38 year old female who had a positive Covid test on November 4.  I did a virtual visit with her on 03/09/2019.  At that time she was having 3 to 4 days of increased cough, chest pressure and shortness of breath.  She was given azithromycin and prednisone for suspected pneumonia.  She had significant improvement of cough.  Now she has occasional dry cough.  She has not been running a fever.  She has been told by her husband and mother that her breathing is "loud."  She feels like she has increased work of breathing.  She is very fatigued.  She is achy with daily headaches.  She stopped using her albuterol nebulizer a week ago.  It was making her jittery.  She also has not been taking any acetaminophen or ibuprofen because she "does not want to have to take it forever."  She does not think her fluid intake is adequate.  She is not having any more nausea or vomiting.  She has a pulse oximeter at home and her readings are from 95 to 97%.   Observations/Objective: Patient is alert and answers questions appropriately.  She is able to converse in complete sentences and does not appear short of breath.  There is no  audible wheeze or witnessed cough.  Visible skin is unremarkable.  Mood and affect are appropriate. Pulse (!) 109   Temp 98 F (36.7 C) (Oral)   Ht 5' (1.524 m)   Wt 190 lb (86.2 kg)   SpO2 95%   BMI 37.11 kg/m  Wt Readings from Last 3 Encounters:  03/30/19 190 lb (86.2 kg)  03/09/19 190 lb (86.2 kg)  02/03/19 191 lb (86.6 kg)    Assessment and Plan: 1. Moderate persistent reactive airway disease with acute exacerbation -She has required inhaled corticosteroids in the past with upper respiratory infection.  Will start her on beclomethasone twice daily. -She was instructed to resume her nebulizer treatments, every 4-6 hours for the next 48 hours while awake to see if this improves her work of breathing. -Emergency room precaution information provided and she verbalized understanding - beclomethasone (QVAR REDIHALER) 40 MCG/ACT inhaler; Inhale 2 puffs into the lungs 2 (two) times daily.  Dispense: 10.6 g; Refill: 1  2. COVID-19 virus infection -She has continued myalgias and headache symptoms and has not been taking any medication for this.  Discussed using over-the-counter analgesics to assist with symptoms and to continue increased rest and work on improving fluid intake -Unfortunately there is no way to determine how long she will feel bad -I have asked her to follow-up with me in 48 hours via MyChart to let me know how she is doing   Crystal Ree,  FNP-BC  [facility] at [facility], [facility]  03/30/2019 10:39 AM   Follow Up Instructions:    I discussed the assessment and treatment plan with the patient. The patient was provided an opportunity to ask questions and all were answered. The patient agreed with the plan and demonstrated an understanding of the instructions.   The patient was advised to call back or seek an in-person evaluation if the symptoms worsen or if the condition fails to improve as anticipated.    Elby Beck,  FNP

## 2019-06-01 ENCOUNTER — Ambulatory Visit (INDEPENDENT_AMBULATORY_CARE_PROVIDER_SITE_OTHER): Payer: Managed Care, Other (non HMO) | Admitting: Family Medicine

## 2019-06-01 ENCOUNTER — Encounter: Payer: Self-pay | Admitting: Family Medicine

## 2019-06-01 VITALS — HR 82 | Temp 98.7°F

## 2019-06-01 DIAGNOSIS — R6889 Other general symptoms and signs: Secondary | ICD-10-CM

## 2019-06-01 MED ORDER — OSELTAMIVIR PHOSPHATE 75 MG PO CAPS: 75.0000 mg | ORAL_CAPSULE | Freq: Two times a day (BID) | ORAL | 0 refills | Status: AC

## 2019-06-01 NOTE — Progress Notes (Signed)
I connected with on 06/01/19 at  9:20 AM EST by video and verified that I am speaking with the correct person using two identifiers.   I discussed the limitations, risks, security and privacy concerns of performing an evaluation and management service by video and the availability of in person appointments. I also discussed with the patient that there may be a patient responsible charge related to this service. The patient expressed understanding and agreed to proceed.  Patient location: Home Provider Location: [facility] Participants: Lynnda Child and Loman Chroman   Subjective:     Crystal Hammond is a 39 y.o. female presenting for Generalized Body Aches (chills, sore throat, headache. Symptoms started yesterday-05/31/19)     URI  This is a new problem. The current episode started yesterday. There has been no fever. Associated symptoms include headaches, nausea and a sore throat. Pertinent negatives include no congestion, diarrhea or vomiting.   Covid diagnosed on 02/18/2020 Treated for pneumonia afterwards Had a broken tooth Then was feeling well and yesterday got hit hard and fast Did get a flu shot this year Sick contact - one son with fever - virtual yesterday and getting throat checked today   Review of Systems  Constitutional: Positive for chills and fatigue. Negative for fever.  HENT: Positive for sore throat. Negative for congestion.   Respiratory: Positive for chest tightness.   Gastrointestinal: Positive for nausea. Negative for diarrhea and vomiting.  Musculoskeletal: Positive for arthralgias and myalgias.  Neurological: Positive for headaches.     Social History   Tobacco Use  Smoking Status Former Smoker  . Packs/day: 0.30  . Years: 3.00  . Pack years: 0.90  . Types: Cigarettes  . Quit date: 2013  . Years since quitting: 8.1  Smokeless Tobacco Never Used        Objective:   BP Readings from Last 3 Encounters:  02/03/19 110/80    01/23/19 102/68   Wt Readings from Last 3 Encounters:  03/30/19 190 lb (86.2 kg)  03/09/19 190 lb (86.2 kg)  02/03/19 191 lb (86.6 kg)    Pulse 82 Comment: per patient  Temp 98.7 F (37.1 C) Comment: per patient  LMP 05/27/2019   SpO2 95% Comment: per patient   Physical Exam Constitutional:      Appearance: Normal appearance. She is not ill-appearing.     Comments: Wrapped in blanket  HENT:     Head: Normocephalic and atraumatic.     Right Ear: External ear normal.     Left Ear: External ear normal.     Nose: Congestion present.  Eyes:     Conjunctiva/sclera: Conjunctivae normal.  Pulmonary:     Effort: Pulmonary effort is normal. No respiratory distress.  Neurological:     Mental Status: She is alert. Mental status is at baseline.  Psychiatric:        Mood and Affect: Mood normal.        Behavior: Behavior normal.        Thought Content: Thought content normal.        Judgment: Judgment normal.            Assessment & Plan:   Problem List Items Addressed This Visit    None    Visit Diagnoses    Flu-like symptoms    -  Primary   Relevant Medications   oseltamivir (TAMIFLU) 75 MG capsule     Pt already had covid w/in the last few months. Lower suspicion for  this. Son also with symptoms. Had flu shot, but given onset of severe symptoms this is certainly possible. Tamiflu prescribed, but advised waiting for sons appointment if son is positive for Strep throat will likely plan to treat her given contact and limitations in pandemic  Symptomatic care  Return if symptoms worsen or fail to improve.  Lesleigh Noe, MD

## 2019-06-01 NOTE — Patient Instructions (Addendum)
Body aches - 800 mg of Ibuprofen every 8 hours - 1000 mg of Tylenol every 8 hours   Sore throat - pain medicine as above - tea with honey/lemon - Cough drops with menthol   You have the flu.   This is highly contagious -- you are contagious 1-2 days before you develop symptoms and for up to 7 days after becoming sick.   Oseltamivir (Tamiflu) -- is helpful if started within 48 hours of symptoms. Ideally within 24 hours of symptoms. This may decrease the length of illness by 1 day and decrease the severity of your illness.

## 2019-06-02 ENCOUNTER — Encounter: Payer: Self-pay | Admitting: Family Medicine

## 2019-06-17 ENCOUNTER — Encounter: Payer: Self-pay | Admitting: Family Medicine

## 2019-06-17 ENCOUNTER — Ambulatory Visit: Payer: Managed Care, Other (non HMO) | Admitting: Family Medicine

## 2019-06-17 ENCOUNTER — Other Ambulatory Visit: Payer: Self-pay

## 2019-06-17 VITALS — BP 124/86 | HR 88 | Temp 98.1°F | Ht 60.0 in | Wt 194.8 lb

## 2019-06-17 DIAGNOSIS — R41840 Attention and concentration deficit: Secondary | ICD-10-CM | POA: Diagnosis not present

## 2019-06-17 DIAGNOSIS — Z111 Encounter for screening for respiratory tuberculosis: Secondary | ICD-10-CM | POA: Diagnosis not present

## 2019-06-17 DIAGNOSIS — F419 Anxiety disorder, unspecified: Secondary | ICD-10-CM | POA: Diagnosis not present

## 2019-06-17 DIAGNOSIS — F32A Depression, unspecified: Secondary | ICD-10-CM

## 2019-06-17 DIAGNOSIS — R7982 Elevated C-reactive protein (CRP): Secondary | ICD-10-CM | POA: Diagnosis not present

## 2019-06-17 DIAGNOSIS — F329 Major depressive disorder, single episode, unspecified: Secondary | ICD-10-CM

## 2019-06-17 LAB — HIGH SENSITIVITY CRP: CRP, High Sensitivity: 7.04 mg/L — ABNORMAL HIGH (ref 0.000–5.000)

## 2019-06-17 NOTE — Progress Notes (Signed)
Subjective:    Patient ID: Crystal Hammond, female    DOB: 1981-03-05, 39 y.o.   MRN: [id number]  HPI Chief Complaint  Patient presents with  . Trouble Focusing    Discuss trouble focusing. Short attention span - fidgety. Hard time completing tasks   Wants to go back to school, has about 2 years to go. Can not get anything finished. Has always had a problem focusing and her Mom refused to acknowledge. In college, she took antidepressants which seemed to help but she gained a large amount of weight and went off medication. She has never been formally tested. Has not been on stimulant medication. We had discussed her getting back into therapy previously but she was not able to do this. Reports some obsessive behaviors regarding the doors/ gate being locked. Is picky about how everything is done- dishwasher being unloaded, folding of towels.   Stopped breast feeding about 6 weeks ago. Usually weight comes off quickly but it is not budging. She feels that she is making healthy food choices and has days of restricted time eating.   Had elevated C reactive protein in fall. Never had repeat labs, will have today.   Needs TB screening for school.   Review of Systems    per HPI Objective:   Physical Exam Vitals reviewed.  Constitutional:      General: She is not in acute distress.    Appearance: Normal appearance. She is obese. She is not ill-appearing, toxic-appearing or diaphoretic.  Eyes:     Conjunctiva/sclera: Conjunctivae normal.  Cardiovascular:     Rate and Rhythm: Normal rate.  Pulmonary:     Effort: Pulmonary effort is normal.  Neurological:     Mental Status: She is alert and oriented to person, place, and time.  Psychiatric:        Mood and Affect: Mood normal.        Behavior: Behavior normal.        Thought Content: Thought content normal.        Judgment: Judgment normal.          BP 124/86 (BP Location: Left Arm, Patient Position: Sitting, Cuff Size: Normal)    Pulse 88   Temp 98.1 F (36.7 C) (Temporal)   Ht 5' (1.524 m)   Wt 194 lb 12.8 oz (88.4 kg)   LMP 05/27/2019   SpO2 97%   BMI 38.04 kg/m  Wt Readings from Last 3 Encounters:  06/17/19 194 lb 12.8 oz (88.4 kg)  03/30/19 190 lb (86.2 kg)  03/09/19 190 lb (86.2 kg)   Depression screen [facility] 2/9 06/17/2019 01/23/2019  Decreased Interest 1 1  Down, Depressed, Hopeless 1 1  PHQ - 2 Score 2 2  Altered sleeping 1 0  Tired, decreased energy 1 2  Change in appetite 3 1  Feeling bad or failure about yourself  1 3  Trouble concentrating 3 2  Moving slowly or fidgety/restless 2 0  Suicidal thoughts 0 0  PHQ-9 Score 13 10  Difficult doing work/chores Very difficult Very difficult     Assessment & Plan:  1. Anxiety and depression - ongoing for many years, difficult to determine what impact her mood is having on her difficulty concentrating - Ambulatory referral to Psychiatry  2. Concentration deficit - Ambulatory referral to Psychiatry  3. Elevated C-reactive protein (CRP) - High sensitivity CRP  4. Screening for tuberculosis - QuantiFERON-TB Gold Plus   Olean Ree, FNP-BC  [place] [facility], [facility]  06/17/2019 9:06 AM

## 2019-06-17 NOTE — Patient Instructions (Addendum)
Please call [facility] for an appointment- [phone]

## 2019-06-21 LAB — SPECIMEN STATUS REPORT

## 2019-06-21 LAB — QUANTIFERON-TB GOLD PLUS
QuantiFERON Mitogen Value: >10 IU/mL
QuantiFERON Nil Value: 0.07 IU/mL
QuantiFERON TB1 Ag Value: 0.09 IU/mL
QuantiFERON TB2 Ag Value: 0.07 IU/mL
QuantiFERON-TB Gold Plus: NEGATIVE

## 2019-06-22 LAB — SPECIMEN STATUS REPORT

## 2019-06-23 ENCOUNTER — Encounter: Payer: Self-pay | Admitting: Family Medicine

## 2019-06-23 LAB — QUANTIFERON-TB GOLD PLUS

## 2019-06-23 NOTE — Telephone Encounter (Signed)
Please advise on the CRP question, thanks.

## 2019-06-23 NOTE — Telephone Encounter (Signed)
Labs for TB testing printed and placed up front with Flu vaccine.

## 2019-06-23 NOTE — Telephone Encounter (Signed)
Flu vaccine Printed and placed up front.   Nothing further needed.

## 2019-06-23 NOTE — Telephone Encounter (Signed)
Can we print for her?

## 2019-06-25 ENCOUNTER — Other Ambulatory Visit: Payer: Self-pay

## 2019-06-25 ENCOUNTER — Ambulatory Visit (INDEPENDENT_AMBULATORY_CARE_PROVIDER_SITE_OTHER): Payer: 59 | Admitting: Psychiatry

## 2019-06-25 ENCOUNTER — Encounter (HOSPITAL_COMMUNITY): Payer: Self-pay | Admitting: Psychiatry

## 2019-06-25 DIAGNOSIS — F411 Generalized anxiety disorder: Secondary | ICD-10-CM

## 2019-06-25 DIAGNOSIS — F909 Attention-deficit hyperactivity disorder, unspecified type: Secondary | ICD-10-CM

## 2019-06-25 DIAGNOSIS — F329 Major depressive disorder, single episode, unspecified: Secondary | ICD-10-CM

## 2019-06-25 DIAGNOSIS — F32A Depression, unspecified: Secondary | ICD-10-CM

## 2019-06-25 MED ORDER — FLUOXETINE HCL 10 MG PO CAPS: ORAL_CAPSULE | ORAL | 0 refills | Status: DC

## 2019-06-25 MED ORDER — AMPHETAMINE-DEXTROAMPHET ER 15 MG PO CP24: 15.0000 mg | ORAL_CAPSULE | ORAL | 0 refills | Status: DC
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
## 2019-07-17 ENCOUNTER — Other Ambulatory Visit (HOSPITAL_COMMUNITY): Payer: Self-pay | Admitting: Psychiatry

## 2019-07-22 ENCOUNTER — Other Ambulatory Visit: Payer: Self-pay

## 2019-07-23 ENCOUNTER — Ambulatory Visit (INDEPENDENT_AMBULATORY_CARE_PROVIDER_SITE_OTHER): Payer: 59 | Admitting: Psychiatry

## 2019-07-23 DIAGNOSIS — F329 Major depressive disorder, single episode, unspecified: Secondary | ICD-10-CM

## 2019-07-23 DIAGNOSIS — F411 Generalized anxiety disorder: Secondary | ICD-10-CM | POA: Diagnosis not present

## 2019-07-23 DIAGNOSIS — F909 Attention-deficit hyperactivity disorder, unspecified type: Secondary | ICD-10-CM | POA: Diagnosis not present

## 2019-07-23 DIAGNOSIS — F32A Depression, unspecified: Secondary | ICD-10-CM

## 2019-07-23 MED ORDER — AMPHETAMINE-DEXTROAMPHETAMINE 10 MG PO TABS: 10.0000 mg | ORAL_TABLET | Freq: Every day | ORAL | 0 refills | Status: DC

## 2019-07-23 MED ORDER — AMPHETAMINE-DEXTROAMPHET ER 25 MG PO CP24: 25.0000 mg | ORAL_CAPSULE | ORAL | 0 refills | Status: DC

## 2019-07-23 MED ORDER — FLUOXETINE HCL 20 MG PO CAPS: 20.0000 mg | ORAL_CAPSULE | Freq: Every day | ORAL | 0 refills | Status: DC

## 2019-07-23 NOTE — Progress Notes (Signed)
BH MD/PA/NP OP Progress Note  07/23/2019 9:24 AM Crystal Hammond  MRN:  [id number] Interview was conducted using WebEx teleconferencing application and I verified that I was speaking with the correct person using two identifiers. I discussed the limitations of evaluation and management by telemedicine and  the availability of in person appointments. Patient expressed understanding and agreed to proceed.  Chief Complaint: Anxiety, lack of focus in PM.  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 She has always struggled with lack of concentration, procrastination, task completion since early elementary school. Her grades were low and she had difficult times on tests in particular. She does not appear to have a significant hyperactivity component. Her self esteem was low as a consequence of these struggles and she always worried about her ability to perform well and satisfy other people expectations of her. Some depression has also been present. She dies having suicidal thoughts, there is no clear indication of bipolar (hypomanic episodes) presence. She was never diagnosed or treated for ADHD as a child although her brother was - he had a clear hyperactivity component and was prescribed Adderall. Her sleep is good, appetite normal. In the past she was treated for anxiety and post-partum depression and had trials of fluoxetine, paroxetine, sertraline and escitalopram. She remembers gaining weight on some of them and did not continue for that reason. We have added fluoxetine for anxiety/depression and while it helps she also feels it makes her sedated. Adderall XR 15 was added for ADD and it helps to some extent but does not work long enough. She has evening classes each day now and when she took it once in PM she  could not fall asleep that day.    Visit Diagnosis:    ICD-10-CM   1. GAD (generalized anxiety disorder)  F41.1   2. Adult ADHD  F90.9   3. Depressive disorder  F32.9     Past Psychiatric History: Please see intake H&P.  Past Medical History:  Past Medical History:  Diagnosis Date  . Anxiety   . Cervical dysplasia   . Depression   . Endometriosis   . 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 Heart murmur   . History of febrile seizure   . History of pneumonia   . Migraines   . Oral herpes   . Postpartum depression   . Recurrent UTI     Past Surgical History:  Procedure Laterality Date  . ABDOMINAL SURGERY  2012   Endometrial Surgery - went through c-section scar  . CESAREAN SECTION  2019  . CESAREAN SECTION  2007  . CESAREAN SECTION  2014  . CESAREAN SECTION  2016  . GASTROSCHISIS CLOSURE    . LEEP     x 2  . WISDOM TOOTH EXTRACTION      Family Psychiatric History: Reviewed.  Family History:  Family History  Problem Relation Age of Onset  . Depression Mother   . Alcohol abuse Mother   . Depression Father   . Alcohol abuse Father   . Bipolar disorder Brother   . Drug abuse Brother   . ADD / ADHD Brother   . ADD / ADHD Son   . ADD / ADHD Son   . Breast cancer Neg Hx   . Ovarian cancer Neg Hx     Social  History:  Social History   Socioeconomic History  . Marital status: Married    Spouse name: Not on file  . Number of children: 4  . Years of education: Not on file  . Highest education level: Not on file  Occupational History  . Not on file  Tobacco Use  . Smoking status: Former Smoker    Packs/day: 0.30    Years: 3.00    Pack years: 0.90    Types: Cigarettes    Quit date: 2013    Years since quitting: 8.2  . Smokeless tobacco: Never Used  Substance and Sexual Activity  . Alcohol use: Yes    Alcohol/week: 2.0 standard drinks    Types: 2 Cans of beer per week  . Drug use: Never  . Sexual activity: Yes    Birth control/protection: None  Other  Topics Concern  . 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 Three sons (13, 6, 4) and one daughter (1 yo). Second oldest has autism spectrum disorder and two oldest ADHD.   Social Determinants of Health   Financial Resource Strain:   . Difficulty of Paying Living Expenses:   Food Insecurity:   . Worried About Programme researcher, broadcasting/film/video in the Last Year:   . Barista in the Last Year:   Transportation Needs:   . Freight forwarder (Medical):   Marland Kitchen Lack of Transportation (Non-Medical):   Physical Activity:   . Days of Exercise per Week:   . Minutes of Exercise per Session:   Stress:   . Feeling of Stress :   Social Connections:   . Frequency of Communication with Friends and Family:   . Frequency of Social Gatherings with Friends and Family:   . Attends Religious Services:   . Active Member of Clubs or Organizations:   . Attends Banker Meetings:   Marland Kitchen Marital Status:     Allergies:  Allergies  Allergen Reactions  . Benadryl [Diphenhydramine]   . Pyridium [Phenazopyridine Hcl]     Metabolic Disorder Labs: Lab Results  Component Value Date   HGBA1C 6.2 01/23/2019   No results found for: PROLACTIN No results found for: CHOL, TRIG, HDL, CHOLHDL, VLDL, LDLCALC Lab Results  Component Value Date   TSH 1.69 01/23/2019    Therapeutic Level Labs: No results found for: LITHIUM No results found for: VALPROATE No components found for:  CBMZ  Current Medications: Current Outpatient Medications  Medication Sig Dispense Refill  . albuterol (PROVENTIL) (2.5 MG/3ML) 0.083% nebulizer solution Take 3 mLs (2.5 mg total) by nebulization every 6 (six) hours as needed for wheezing or shortness of breath. 90 mL 1  . albuterol (VENTOLIN HFA) 108 (90 Base) MCG/ACT inhaler Inhale 2 puffs into the lungs every 4 (four) hours as needed for wheezing or shortness of breath (cough, shortness of breath or wheezing.). 18 g  0  . amphetamine-dextroamphetamine (ADDERALL XR) 25 MG 24 hr capsule Take 1 capsule by mouth every morning. 30 capsule 0  . [START ON 08/22/2019] amphetamine-dextroamphetamine (ADDERALL XR) 25 MG 24 hr capsule Take 1 capsule by mouth every morning. 30 capsule 0  . [START ON 09/21/2019] amphetamine-dextroamphetamine (ADDERALL XR) 25 MG 24 hr capsule Take 1 capsule by mouth every morning. 30 capsule 0  . amphetamine-dextroamphetamine (ADDERALL) 10 MG tablet Take 1 tablet (10 mg total) by mouth daily at 4 PM. 30 tablet 0  . [START ON  08/22/2019] amphetamine-dextroamphetamine (ADDERALL) 10 MG tablet Take 1 tablet (10 mg total) by mouth daily at 4 PM. 30 tablet 0  . [START ON 09/21/2019] amphetamine-dextroamphetamine (ADDERALL) 10 MG tablet Take 1 tablet (10 mg total) by mouth daily at 4 PM. 30 tablet 0  . FLUoxetine (PROZAC) 20 MG capsule Take 1 capsule (20 mg total) by mouth daily after supper. 90 capsule 0  . promethazine (PHENERGAN) 12.5 MG tablet Take 1 tablet (12.5 mg total) by mouth every 8 (eight) hours as needed for nausea or vomiting. 20 tablet 0  . Respiratory Therapy Supplies (ADULT MASK) DEVI 1 Units by Does not apply route as needed. 1 each 0   No current facility-administered medications for this visit.     Psychiatric Specialty Exam: Review of Systems  Psychiatric/Behavioral: Positive for decreased concentration. The patient is nervous/anxious.   All other systems reviewed and are negative.   There were no vitals taken for this visit.There is no height or weight on file to calculate BMI.  General Appearance: Casual and Fairly Groomed  Eye Contact:  Good  Speech:  Clear and Coherent and Normal Rate  Volume:  Normal  Mood:  Anxious  Affect:  Constricted  Thought Process:  Goal Directed and Linear  Orientation:  Full (Time, Place, and Person)  Thought Content: Logical   Suicidal Thoughts:  No  Homicidal Thoughts:  No  Memory:  Immediate;   Good Recent;   Good Remote;   Good   Judgement:  Good  Insight:  Fair  Psychomotor Activity:  Normal  Concentration:  Concentration: Fair  Recall:  Good  Fund of Knowledge: Good  Language: Good  Akathisia:  Negative  Handed:  Right  AIMS (if indicated): not done  Assets:  Communication Skills Desire for Improvement Financial Resources/Insurance Housing Physical Health Social Support  ADL's:  Intact  Cognition: WNL  Sleep:  Good   Screenings: PHQ2-9     Office Visit from 06/17/2019 in [facility] at [place] from 01/23/2019 in [facility] at [place]  PHQ-2 Total Score  2  2  PHQ-9 Total Score  13  10       Assessment and Plan: 39 yo married female who comes reporting long standing problems with anxiety, concentration, task completion, low self esteem which 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 She has always struggled with lack of concentration, procrastination, task completion since early elementary school. Her grades were low and she had difficult times on tests in particular. She does not appear to have a significant hyperactivity component. Her self esteem was low as a consequence of these struggles and she always worried about her ability to perform well and satisfy other people expectations of her. Some depression has also been present. She dies having suicidal thoughts, there is no clear indication of bipolar (hypomanic episodes) presence. She was never diagnosed or treated for ADHD as a child although her brother was - he had a clear hyperactivity component and was prescribed Adderall. Her sleep is good, appetite normal. In the past she was treated for anxiety and post-partum depression and had trials of fluoxetine, paroxetine, sertraline and escitalopram. She remembers gaining weight on some of them and did not continue for that reason. We have added fluoxetine for anxiety/depression and while it helps she  also feels it makes her sedated. She used to bite nails when anxious and she does not do that  anymore. Adderall XR 15 was added for ADD and it helps to some extent but does not work long enough. She has evening classes each day now and when she took it once in PM she could not fall asleep that day.   Dx: GAD; Depression unspecified; Adult ADHD  Plan: We will try moving fluoxetine 20 mg for anxiety/depression to evening hours. We will increase Adderall XR to 25 mg daily and add IR Adderall 10 mg in PM prn on days when she has evening classes. Next visit in 3 months. The plan was discussed with patient who had an opportunity to ask questions and these were all answered. I spend 30 minutes in videoconferencing with the patient.    Magdalene Patricia, MD 07/23/2019, 9:24 AM

## 2019-07-24 ENCOUNTER — Encounter: Payer: Self-pay | Admitting: Family Medicine

## 2019-07-24 ENCOUNTER — Ambulatory Visit (INDEPENDENT_AMBULATORY_CARE_PROVIDER_SITE_OTHER): Payer: Managed Care, Other (non HMO) | Admitting: Family Medicine

## 2019-07-24 ENCOUNTER — Other Ambulatory Visit: Payer: Self-pay

## 2019-07-24 VITALS — BP 104/68 | HR 79 | Temp 98.2°F | Ht 60.0 in | Wt 183.8 lb

## 2019-07-24 DIAGNOSIS — R7982 Elevated C-reactive protein (CRP): Secondary | ICD-10-CM

## 2019-07-24 DIAGNOSIS — F909 Attention-deficit hyperactivity disorder, unspecified type: Secondary | ICD-10-CM

## 2019-07-24 DIAGNOSIS — R7303 Prediabetes: Secondary | ICD-10-CM | POA: Diagnosis not present

## 2019-07-24 DIAGNOSIS — F411 Generalized anxiety disorder: Secondary | ICD-10-CM | POA: Diagnosis not present

## 2019-07-24 DIAGNOSIS — Z8616 Personal history of COVID-19: Secondary | ICD-10-CM | POA: Diagnosis not present

## 2019-07-24 NOTE — Patient Instructions (Addendum)
Good to see you today  Follow up in 8 weeks for labs only  Follow up for complete physical 6 months

## 2019-07-24 NOTE — Progress Notes (Signed)
   Subjective:    Patient ID: Crystal Hammond, female    DOB: [birth year], 39 y.o.   MRN: [id number]  HPI Chief Complaint  Patient presents with  . Follow-up    6 month follow up    This is a 39 yo female who presents today for follow up of chronic conditions.   Anxiety/ depression/ ADHD- has been seeing psychiatry. Mood has been more stable. Has had a terrible week, but did not feel out of control or anxious.  Continue to adjust medications but overall significant improvement.  Obesity-has had some weight loss.  Watching what she is eating.  Post covid- continues to feel some SOB with exertion. Chest pain. No cough, rare wheeze. Taste and smell starting to come back. Not the same.   Anxiety/depression-this is improved with medication.   Review of Systems Per HPI    Objective:   Physical Exam Vitals reviewed.  Constitutional:      General: She is not in acute distress.    Appearance: Normal appearance. She is obese. She is not ill-appearing, toxic-appearing or diaphoretic.  Eyes:     Conjunctiva/sclera: Conjunctivae normal.  Cardiovascular:     Rate and Rhythm: Normal rate and regular rhythm.  Pulmonary:     Effort: Pulmonary effort is normal.     Breath sounds: Normal breath sounds.  Neurological:     Mental Status: She is alert and oriented to person, place, and time.  Psychiatric:        Mood and Affect: Mood normal.        Behavior: Behavior normal.        Thought Content: Thought content normal.        Judgment: Judgment normal.          BP 104/68   Pulse 79   Temp 98.2 F (36.8 C) (Tympanic)   Ht 5' (1.524 m)   Wt 183 lb 12.8 oz (83.4 kg)   SpO2 96%   BMI 35.90 kg/m  Wt Readings from Last 3 Encounters:  07/24/19 183 lb 12.8 oz (83.4 kg)  06/17/19 194 lb 12.8 oz (88.4 kg)  03/30/19 190 lb (86.2 kg)    Assessment & Plan:  1. Prediabetes -Gust follow-up blood work, encouraged continued healthy food choices and weight loss - Hemoglobin A1c;  Future  2. Elevated C-reactive protein (CRP) - High sensitivity CRP; Future  3. History of COVID-19 -Likely she is having some post infection symptoms, encouraged her to continue to increase activity as tolerated, if no improvement in a couple of months, follow-up.  May need referral to pulmonary for PFTs as we are unable to do them in our office.  4. GAD (generalized anxiety disorder) -Significantly improved on current medication's, continue follow-up with psychiatry  5. Adult ADHD -Per #4  -Follow-up for complete physical in 6 months  This visit occurred during the SARS-CoV-2 public health emergency.  Safety protocols were in place, including screening questions prior to the visit, additional usage of staff PPE, and extensive cleaning of exam room while observing appropriate contact time as indicated for disinfecting solutions.      Olean Ree, FNP-BC   Primary [facility] at [facility], [facility]  07/26/2019 8:50 AM

## 2019-07-26 ENCOUNTER — Encounter: Payer: Self-pay | Admitting: Family Medicine

## 2019-07-26 DIAGNOSIS — R7303 Prediabetes: Secondary | ICD-10-CM | POA: Insufficient documentation

## 2019-08-20 ENCOUNTER — Telehealth (HOSPITAL_COMMUNITY): Payer: Self-pay

## 2019-08-20 NOTE — Telephone Encounter (Signed)
Pt called requesting refill of Adderall rx. Reminded pt she would be able to pick this prescription up after 30 days from last pickup which is 5/8. Pt verbalized understanding.

## 2019-09-04 ENCOUNTER — Other Ambulatory Visit
Admission: RE | Admit: 2019-09-04 | Discharge: 2019-09-04 | Disposition: A | Discharge status: Home / Self Care | Payer: Managed Care, Other (non HMO) | Source: Ambulatory Visit | Attending: Internal Medicine | Admitting: Internal Medicine

## 2019-09-04 ENCOUNTER — Telehealth: Payer: Self-pay

## 2019-09-04 DIAGNOSIS — R079 Chest pain, unspecified: Secondary | ICD-10-CM | POA: Insufficient documentation

## 2019-09-04 LAB — FIBRIN DERIVATIVES D-DIMER (ARMC ONLY): Fibrin derivatives D-dimer (ARMC): 218.06 ng/mL (FEU) (ref 0.00–499.00)

## 2019-09-04 NOTE — Telephone Encounter (Signed)
Noted  

## 2019-09-04 NOTE — Telephone Encounter (Signed)
Pt having dull CP from rt breast radiating to rt arm pit. Pain level is 1. Hurts to breathe and pt has hx of pneumonia and URI per pt. Pt has dry cough, dry heaves and nausea. Pt declined 911 and pts husband will take pt to UC or ED; pt is not sure which facility she will go but wherever her husband takes her she will be evaluatted. FYI to Harlin Heys FNP.

## 2019-09-04 NOTE — Telephone Encounter (Signed)
[facility] Day - Client TELEPHONE ADVICE RECORD [facility] Patient Name: Crystal Hammond Gender: Female DOB: [birth year] Age: 39 Y 3 M 6 D Return Phone Number: [phone] (Primary) Address: City/State/Zip: [place] [address] Client [facility] Day - Client Client Site [facility] - Day Physician Deboraha Sprang- NP Contact Type Call Who Is Calling Patient / Member / Family / Caregiver Call Type Triage / Clinical Relationship To Patient Self Return Phone Number [phone] (Primary) Chief Complaint CHEST PAIN (>=21 years) - pain, pressure, heaviness or tightness Reason for Call Symptomatic / Request for Health Information Initial Comment Office is transferring a pt who has chest pain and tingling in her left arm. Translation No Nurse Assessment Nurse: Anner Crete, RN, Olegario Messier Date/Time (Eastern Time): 09/04/2019 8:17:17 AM Confirm and document reason for call. If symptomatic, describe symptoms. ---Patient stated she is having pain in her right breast that radiates up under her armpit. She stated she does not have any tingling anywhere. She did receive her COVID vaccine on Tuesday. Has the patient had close contact with a person known or suspected to have the novel coronavirus illness OR traveled / lives in area with major community spread (including international travel) in the last 14 days from the onset of symptoms? * If Asymptomatic, screen for exposure and travel within the last 14 days. ---No Does the patient have any new or worsening symptoms? ---Yes Will a triage be completed? ---Yes Related visit to physician within the last 2 weeks? ---No Does the PT have any chronic conditions? (i.e. diabetes, asthma, this includes High risk factors for pregnancy, etc.) ---No Is the patient pregnant or possibly pregnant? (Ask all females between the ages of 74-55) ---No Is this a behavioral health or substance abuse  call? ---No Guidelines Guideline Title Affirmed Question Affirmed Notes Nurse Date/Time (Eastern Time) Breast Symptoms [1Breast looks infected (spreading redness, feels hot or painful to touch) AND [2] no fever Anner Crete, RN, Olegario Messier 09/04/2019 8:18:22 AM Disp. Time Lamount Cohen Time) Disposition Final User PLEASE NOTE: All timestamps contained within this report are represented as [place] Standard Time. CONFIDENTIALTY NOTICE: This fax transmission is intended only for the addressee. It contains information that is legally privileged, confidential or otherwise protected from use or disclosure. If you are not the intended recipient, you are strictly prohibited from reviewing, disclosing, copying using or disseminating any of this information or taking any action in reliance on or regarding this information. If you have received this fax in error, please notify us immediately by telephone so that we can arrange for its return to [place]. Phone: [phone], Toll-Free: [phone], Fax: [phone] Page: 2 of 2 Call Id: [id number] 09/04/2019 8:21:36 AM See PCP within 24 Hours Yes Anner Crete, RN, Sammuel Bailiff Disagree/Comply Comply Caller Understands Yes PreDisposition Call Pharmacist Care Advice Given Per Guideline SEE PCP WITHIN 24 HOURS: * IF OFFICE WILL BE OPEN: You need to be seen within the next 24 hours. Call your doctor (or NP/PA) when the office opens and make an appointment. LOCAL HEAT: * Apply a warm washcloth for 10 minutes three times a day. * This will help to increase circulation and improve healing. PAIN MEDICINES: * For pain relief, you can take either acetaminophen, ibuprofen, or naproxen. CALL BACK IF: * Fever occurs * You become worse. CARE ADVICE given per Breast Symptoms (Adult) guideline. Referrals REFERRED TO PCP OFFICE

## 2019-09-18 ENCOUNTER — Telehealth (INDEPENDENT_AMBULATORY_CARE_PROVIDER_SITE_OTHER): Payer: Managed Care, Other (non HMO) | Admitting: Family Medicine

## 2019-09-18 ENCOUNTER — Other Ambulatory Visit: Payer: Medicaid Other

## 2019-09-18 ENCOUNTER — Encounter: Payer: Self-pay | Admitting: Family Medicine

## 2019-09-18 DIAGNOSIS — J209 Acute bronchitis, unspecified: Secondary | ICD-10-CM | POA: Diagnosis not present

## 2019-09-18 MED ORDER — PREDNISONE 10 MG PO TABS: ORAL_TABLET | ORAL | 0 refills | Status: DC

## 2019-09-18 MED ORDER — AZITHROMYCIN 250 MG PO TABS: ORAL_TABLET | ORAL | 0 refills | Status: DC

## 2019-09-18 MED ORDER — BENZONATATE 200 MG PO CAPS: 200.0000 mg | ORAL_CAPSULE | Freq: Three times a day (TID) | ORAL | 0 refills | Status: DC | PRN

## 2019-09-18 NOTE — Patient Instructions (Signed)
I sent zithromax and prednisone to the pharmacy  Drink fluids Continue expectorant if helpful  Try tessalon for cough  Hycodan at night  Albuterol as needed for tight chest  Update if not starting to improve in a week or if worsening   Consider covid testing if not improving   If suddenly worse go to ER

## 2019-09-18 NOTE — Progress Notes (Signed)
Virtual Visit via Video Note  I connected with Crystal Hammond on 09/18/19 at  9:00 AM EDT by a video enabled telemedicine application and verified that I am speaking with the correct person using two identifiers.  Location: Patient: home Provider: office   I discussed the limitations of evaluation and management by telemedicine and the availability of in person appointments. The patient expressed understanding and agreed to proceed.  Parties involved in encounter  Patient:Crystal Hammond  Provider:  Loura Pardon MD    History of Present Illness: 39 yo pt of NP Carlean Purl presents with nasal congestion and cough   covid vaccine number one was 5/18 Tomma Rakers)  Supposed to get it next week   Pulse 83 Pulse ox 98% Former smoker    She was diagnosed with bronchitis on 5/21 and treated (hycodan and prednisone and zithromax) cxr showed ? Inc markings on R  D dimer neg  Other labs neg   Did get totally better for about a week  Then got sick again  Constant cough -sounds productive but cannot get phlegm out  All night  Sob with exertion occ  Tried an albuterol tx -helps some but makes her shaky  Some hoarseness  Has kids at home -they are not sick    She has chest tightness (? Wheezing)   Has a runny nose- not unusual  No headache No fever  No loss of taste or smell   Had covid in November    Since she had covid she has gotten sick a lot   otc vics Humidifier Cold and sinus combo med Day quil and ny quil  Hycodan helps but very sedating  (has some left)  Has taken robitussin  Drinks fluids (water)    Patient Active Problem List   Diagnosis Date Noted  . Acute bronchitis with bronchospasm 09/18/2019  . History of COVID-19 07/26/2019  . Prediabetes 07/26/2019  . GAD (generalized anxiety disorder) 06/25/2019  . Adult ADHD 06/25/2019  . Depressive disorder 06/25/2019  . Diet controlled gestational diabetes mellitus (GDM) in third trimester 05/27/2017  . Family  history of congenital anomalies 05/27/2017  . Herpes 05/27/2017  . Hx of cesarean section 05/27/2017  . Agoraphobia 05/30/2016  . History of abnormal cervical Pap smear 01/31/2016  . History of kidney stones 01/31/2016   Past Medical History:  Diagnosis Date  . Anxiety   . Cervical dysplasia   . Depression   . Endometriosis   . 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 Heart murmur   . History of febrile seizure   . History of pneumonia   . Migraines   . Oral herpes   . Postpartum depression   . Recurrent UTI    Past Surgical History:  Procedure Laterality Date  . ABDOMINAL SURGERY  2012   Endometrial Surgery - went through c-section scar  . CESAREAN SECTION  2019  . CESAREAN SECTION  2007  . CESAREAN SECTION  2014  . CESAREAN SECTION  2016  . GASTROSCHISIS CLOSURE    . LEEP     x 2  . WISDOM TOOTH EXTRACTION     Social History   Tobacco Use  . Smoking status: Former Smoker    Packs/day: 0.30    Years: 3.00    Pack years: 0.90    Types: Cigarettes    Quit date: 2013    Years since quitting: 8.4  . Smokeless tobacco: Never Used  Substance Use Topics  . Alcohol use: Yes    Alcohol/week: 2.0  standard drinks    Types: 2 Cans of beer per week  . Drug use: Never   Family History  Problem Relation Age of Onset  . Depression Mother   . Alcohol abuse Mother   . Depression Father   . Alcohol abuse Father   . Bipolar disorder Brother   . Drug abuse Brother   . ADD / ADHD Brother   . ADD / ADHD Son   . ADD / ADHD Son   . Breast cancer Neg Hx   . Ovarian cancer Neg Hx    Allergies  Allergen Reactions  . Other Swelling  . Phenazopyridine Hives and Rash  . Benadryl [Diphenhydramine]   . Pyridium [Phenazopyridine Hcl]    Current Outpatient Medications on File Prior to Visit  Medication Sig Dispense Refill  . albuterol (PROVENTIL) (2.5 MG/3ML) 0.083% nebulizer solution Take 3 mLs (2.5 mg total) by nebulization every 6 (six) hours as needed for wheezing or shortness of  breath. 90 mL 1  . albuterol (VENTOLIN HFA) 108 (90 Base) MCG/ACT inhaler Inhale 2 puffs into the lungs every 4 (four) hours as needed for wheezing or shortness of breath (cough, shortness of breath or wheezing.). 18 g 0  . amphetamine-dextroamphetamine (ADDERALL XR) 25 MG 24 hr capsule Take 1 capsule by mouth every morning. 30 capsule 0  . [START ON 09/21/2019] amphetamine-dextroamphetamine (ADDERALL XR) 25 MG 24 hr capsule Take 1 capsule by mouth every morning. 30 capsule 0  . amphetamine-dextroamphetamine (ADDERALL) 10 MG tablet Take 1 tablet (10 mg total) by mouth daily at 4 PM. 30 tablet 0  . [START ON 09/21/2019] amphetamine-dextroamphetamine (ADDERALL) 10 MG tablet Take 1 tablet (10 mg total) by mouth daily at 4 PM. 30 tablet 0  . FLUoxetine (PROZAC) 20 MG capsule Take 1 capsule (20 mg total) by mouth daily after supper. 90 capsule 0  . promethazine (PHENERGAN) 12.5 MG tablet Take 1 tablet (12.5 mg total) by mouth every 8 (eight) hours as needed for nausea or vomiting. 20 tablet 0  . Respiratory Therapy Supplies (ADULT MASK) DEVI 1 Units by Does not apply route as needed. 1 each 0  . amphetamine-dextroamphetamine (ADDERALL XR) 25 MG 24 hr capsule Take 1 capsule by mouth every morning. 30 capsule 0  . amphetamine-dextroamphetamine (ADDERALL) 10 MG tablet Take 1 tablet (10 mg total) by mouth daily at 4 PM. 30 tablet 0   No current facility-administered medications on file prior to visit.    Review of Systems  Constitutional: Positive for malaise/fatigue. Negative for chills and fever.  HENT: Negative for congestion, ear pain, sinus pain and sore throat.   Eyes: Negative for blurred vision, discharge and redness.  Respiratory: Positive for cough and wheezing. Negative for sputum production, shortness of breath and stridor.   Cardiovascular: Negative for chest pain, palpitations and leg swelling.  Gastrointestinal: Negative for abdominal pain, diarrhea, nausea and vomiting.  Musculoskeletal:  Negative for myalgias.  Skin: Negative for rash.  Neurological: Negative for dizziness and headaches.    Observations/Objective: Patient appears well, in no distress Weight is baseline  No facial swelling or asymmetry Voice is hoarse No obvious tremor or mobility impairment Moving neck and UEs normally Able to hear the call well  occ rattling cough/ no sob with speech and no audible wheeze Talkative and mentally sharp with no cognitive changes No skin changes on face or neck , no rash or pallor Affect is normal    Assessment and Plan: Problem List Items Addressed This Visit  Respiratory   Acute bronchitis with bronchospasm    Pt had covid in nov and one shot /pending the second  Had a bout of bronchitis in may that got better  Now tight chest/wheeze and cough   Will re tx with prednisone and zpak Hycodan for pm cough  Tessalon px for cough tid  Update if not starting to improve in a week or if worsening   Consider covid testing if no imp or worse  Go to ER if acutely worse  Rev her records [facility] incl lab and XR  F/u with pcp if not imp           Follow Up Instructions: I sent zithromax and prednisone to the pharmacy  Drink fluids Continue expectorant if helpful  Try tessalon for cough  Hycodan at night  Albuterol as needed for tight chest  Update if not starting to improve in a week or if worsening   Consider covid testing if not improving   If suddenly worse go to ER   I discussed the assessment and treatment plan with the patient. The patient was provided an opportunity to ask questions and all were answered. The patient agreed with the plan and demonstrated an understanding of the instructions.   The patient was advised to call back or seek an in-person evaluation if the symptoms worsen or if the condition fails to improve as anticipated.     Roxy Manns, MD

## 2019-09-18 NOTE — Assessment & Plan Note (Signed)
Pt had covid in nov and one shot /pending the second  Had a bout of bronchitis in may that got better  Now tight chest/wheeze and cough   Will re tx with prednisone and zpak Hycodan for pm cough  Tessalon px for cough tid  Update if not starting to improve in a week or if worsening   Consider covid testing if no imp or worse  Go to ER if acutely worse  Rev her records from [facility] incl lab and XR  F/u with pcp if not imp

## 2019-10-13 ENCOUNTER — Other Ambulatory Visit (HOSPITAL_COMMUNITY): Payer: Self-pay | Admitting: Psychiatry

## 2019-10-22 ENCOUNTER — Telehealth (INDEPENDENT_AMBULATORY_CARE_PROVIDER_SITE_OTHER): Payer: 59 | Admitting: Psychiatry

## 2019-10-22 ENCOUNTER — Other Ambulatory Visit: Payer: Self-pay

## 2019-10-22 DIAGNOSIS — F329 Major depressive disorder, single episode, unspecified: Secondary | ICD-10-CM

## 2019-10-22 DIAGNOSIS — F32A Depression, unspecified: Secondary | ICD-10-CM

## 2019-10-22 DIAGNOSIS — F909 Attention-deficit hyperactivity disorder, unspecified type: Secondary | ICD-10-CM

## 2019-10-22 DIAGNOSIS — F411 Generalized anxiety disorder: Secondary | ICD-10-CM | POA: Diagnosis not present

## 2019-10-22 MED ORDER — AMPHETAMINE-DEXTROAMPHET ER 20 MG PO CP24: 20.0000 mg | ORAL_CAPSULE | Freq: Two times a day (BID) | ORAL | 0 refills | Status: DC

## 2019-10-22 MED ORDER — AMPHETAMINE-DEXTROAMPHET ER 20 MG PO CP24
20.0000 mg | ORAL_CAPSULE | Freq: Two times a day (BID) | ORAL | 0 refills | Status: DC
Start: 2019-12-21 — End: 2019-11-23

## 2019-10-22 MED ORDER — FLUOXETINE HCL 40 MG PO CAPS: 40.0000 mg | ORAL_CAPSULE | Freq: Every day | ORAL | 0 refills | Status: DC

## 2019-10-22 NOTE — Progress Notes (Signed)
BH MD/PA/NP OP Progress Note  10/22/2019 9:46 AM Crystal Hammond  MRN:  [id number] Interview was conducted using videoconferencing application and I verified that I was speaking with the correct person using two identifiers. I discussed the limitations of evaluation and management by telemedicine and  the availability of in person appointments. Patient expressed understanding and agreed to proceed. Patient location - home; physician - home office.  Chief Complaint: Lack of focus in PM, more depression.  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 She has always struggled with lack of concentration, procrastination, task completion since early elementary school. Her grades were low and she had difficult times on tests in particular. She does not appear to have a significant hyperactivity component. Her self esteem was low as a consequence of these struggles and she always worried about her ability to perform well and satisfy other people expectations of her. Some depression has also been present. She dies having suicidal thoughts, there is no clear indication of bipolar (hypomanic episodes) presence. She was never diagnosed or treated for ADHD as a child although her brother was - he had a clear hyperactivity component and was prescribed Adderall. Her sleep is good, appetite normal. In the past she was treated for anxiety and post-partum depression and had trials of fluoxetine, paroxetine, sertraline and escitalopram. She remembers gaining weight on some of them and did not continue for that reason. We have added fluoxetine for anxiety/depression and while it helps she also feels it makes her sedated. She used to bite nails when anxious and she does not do that anymore. Adderall XR was added for ADD and it  helps to some extent but does not work long enough. She has evening classes each day now and when she took it once in PM she could not fall asleep that day. Adderall IR 10 mg in PM was added but it does npt seem to work well enough. More anxious and depressed lately.  Visit Diagnosis:    ICD-10-CM   1. GAD (generalized anxiety disorder)  F41.1   2. Depressive disorder  F32.9   3. Adult ADHD  F90.9     Past Psychiatric History: Please see intake H&P.  Past Medical History:  Past Medical History:  Diagnosis Date  . Anxiety   . Cervical dysplasia   . Depression   . Endometriosis   . 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 Heart murmur   . History of febrile seizure   . History of pneumonia   . Migraines   . Oral herpes   . Postpartum depression   . Recurrent UTI     Past Surgical History:  Procedure Laterality Date  . ABDOMINAL SURGERY  2012   Endometrial Surgery - went through c-section scar  . CESAREAN SECTION  2019  . CESAREAN SECTION  2007  . CESAREAN SECTION  2014  . CESAREAN SECTION  2016  . GASTROSCHISIS CLOSURE    . LEEP     x 2  . WISDOM TOOTH EXTRACTION      Family Psychiatric History: Reviewed.  Family History:  Family History  Problem Relation Age of Onset  . Depression Mother   . Alcohol abuse Mother   . Depression Father   . Alcohol abuse Father   . Bipolar disorder Brother   . Drug  abuse Brother   . ADD / ADHD Brother   . ADD / ADHD Son   . ADD / ADHD Son   . Breast cancer Neg Hx   . Ovarian cancer Neg Hx     Social History:  Social History   Socioeconomic History  . Marital status: Married    Spouse name: Not on file  . Number of children: 4  . Years of education: Not on file  . Highest education level: Not on file  Occupational History  . Not on file  Tobacco Use  . Smoking status: Former Smoker    Packs/day: 0.30    Years: 3.00    Pack years: 0.90    Types: Cigarettes    Quit date: 2013    Years since quitting: 8.5  . Smokeless  tobacco: Never Used  Vaping Use  . Vaping Use: Never used  Substance and Sexual Activity  . Alcohol use: Yes    Alcohol/week: 2.0 standard drinks    Types: 2 Cans of beer per week  . Drug use: Never  . Sexual activity: Yes    Birth control/protection: None  Other Topics Concern  . 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 Three sons (13, 6, 4) and one daughter (1 yo). Second oldest has autism spectrum disorder and two oldest ADHD.   Social Determinants of Health   Financial Resource Strain:   . Difficulty of Paying Living Expenses:   Food Insecurity:   . Worried About Programme researcher, broadcasting/film/video in the Last Year:   . Barista in the Last Year:   Transportation Needs:   . Freight forwarder (Medical):   Marland Kitchen Lack of Transportation (Non-Medical):   Physical Activity:   . Days of Exercise per Week:   . Minutes of Exercise per Session:   Stress:   . Feeling of Stress :   Social Connections:   . Frequency of Communication with Friends and Family:   . Frequency of Social Gatherings with Friends and Family:   . Attends Religious Services:   . Active Member of Clubs or Organizations:   . Attends Banker Meetings:   Marland Kitchen Marital Status:     Allergies:  Allergies  Allergen Reactions  . Other Swelling  . Phenazopyridine Hives and Rash  . Benadryl [Diphenhydramine]   . Pyridium [Phenazopyridine Hcl]     Metabolic Disorder Labs: Lab Results  Component Value Date   HGBA1C 6.2 01/23/2019   No results found for: PROLACTIN No results found for: CHOL, TRIG, HDL, CHOLHDL, VLDL, LDLCALC Lab Results  Component Value Date   TSH 1.69 01/23/2019    Therapeutic Level Labs: No results found for: LITHIUM No results found for: VALPROATE No components found for:  CBMZ  Current Medications: Current Outpatient Medications  Medication Sig Dispense Refill  . albuterol (PROVENTIL) (2.5 MG/3ML) 0.083% nebulizer  solution Take 3 mLs (2.5 mg total) by nebulization every 6 (six) hours as needed for wheezing or shortness of breath. 90 mL 1  . albuterol (VENTOLIN HFA) 108 (90 Base) MCG/ACT inhaler Inhale 2 puffs into the lungs every 4 (four) hours as needed for wheezing or shortness of breath (cough, shortness of breath or wheezing.). 18 g 0  . amphetamine-dextroamphetamine (ADDERALL XR) 20 MG 24 hr capsule Take 1 capsule (20 mg total) by mouth 2 (two) times daily with breakfast and lunch. 60 capsule 0  . [  START ON 11/21/2019] amphetamine-dextroamphetamine (ADDERALL XR) 20 MG 24 hr capsule Take 1 capsule (20 mg total) by mouth 2 (two) times daily with breakfast and lunch. 60 capsule 0  . [START ON 12/21/2019] amphetamine-dextroamphetamine (ADDERALL XR) 20 MG 24 hr capsule Take 1 capsule (20 mg total) by mouth 2 (two) times daily with breakfast and lunch. 60 capsule 0  . amphetamine-dextroamphetamine (ADDERALL XR) 25 MG 24 hr capsule Take 1 capsule by mouth every morning. 30 capsule 0  . azithromycin (ZITHROMAX Z-PAK) 250 MG tablet Take 2 pills by mouth today and then 1 pill daily for 4 days 6 tablet 0  . benzonatate (TESSALON) 200 MG capsule Take 1 capsule (200 mg total) by mouth 3 (three) times daily as needed for cough. Swallow whole, do not bite pill 30 capsule 0  . FLUoxetine (PROZAC) 40 MG capsule Take 1 capsule (40 mg total) by mouth daily after supper. 90 capsule 0  . predniSONE (DELTASONE) 10 MG tablet Take 3 pills once daily by mouth for 3 days, then 2 pills once daily for 3 days, then 1 pill once daily for 3 days and then stop 18 tablet 0  . promethazine (PHENERGAN) 12.5 MG tablet Take 1 tablet (12.5 mg total) by mouth every 8 (eight) hours as needed for nausea or vomiting. 20 tablet 0  . Respiratory Therapy Supplies (ADULT MASK) DEVI 1 Units by Does not apply route as needed. 1 each 0   No current facility-administered medications for this visit.      Psychiatric Specialty Exam: Review of Systems   Psychiatric/Behavioral: Positive for decreased concentration. The patient is nervous/anxious.   All other systems reviewed and are negative.   There were no vitals taken for this visit.There is no height or weight on file to calculate BMI.  General Appearance: Casual and Fairly Groomed  Eye Contact:  Good  Speech:  Clear and Coherent and Normal Rate  Volume:  Normal  Mood:  Anxious  Affect:  Full Range  Thought Process:  Goal Directed  Orientation:  Full (Time, Place, and Person)  Thought Content: Logical   Suicidal Thoughts:  No  Homicidal Thoughts:  No  Memory:  Immediate;   Good Recent;   Good Remote;   Good  Judgement:  Good  Insight:  Good  Psychomotor Activity:  Normal  Concentration:  Concentration: Fair  Recall:  Good  Fund of Knowledge: Good  Language: Good  Akathisia:  Negative  Handed:  Right  AIMS (if indicated): not done  Assets:  Communication Skills Desire for Improvement Financial Resources/Insurance Housing Resilience Talents/Skills  ADL's:  Intact  Cognition: WNL  Sleep:  Fair   Screenings: PHQ2-9     Office Visit from 07/24/2019 in [facility] Visit from 06/17/2019 in [facility] at [facility] Visit from 01/23/2019 in [facility]  PHQ-2 Total Score 0 2 2  PHQ-9 Total Score -- 13 10       Assessment and Plan: 39 yo married female who comes reporting long standing problems with anxiety, concentration, task completion, low self esteem which 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 She has always struggled with lack of concentration, procrastination, task completion since early elementary school. Her grades were low and she had difficult times on tests in particular. She does not appear to have a significant hyperactivity component. Her self esteem was low as a consequence of  these struggles and she always worried about  her ability to perform well and satisfy other people expectations of her. Some depression has also been present. She dies having suicidal thoughts, there is no clear indication of bipolar (hypomanic episodes) presence. She was never diagnosed or treated for ADHD as a child although her brother was - he had a clear hyperactivity component and was prescribed Adderall. Her sleep is good, appetite normal. In the past she was treated for anxiety and post-partum depression and had trials of fluoxetine, paroxetine, sertraline and escitalopram. She remembers gaining weight on some of them and did not continue for that reason. We have added fluoxetine for anxiety/depression and while it helps she also feels it makes her sedated. She used to bite nails when anxious and she does not do that anymore. Adderall XR was added for ADD and it helps to some extent but does not work long enough. She has evening classes each day now and when she took it once in PM she could not fall asleep that day. Adderall IR 10 mg in PM was added but it does npt seem to work well enough. More anxious and depressed lately.  Dx: GAD; Depression unspecified; Adult ADHD  Plan: We will increase fluoxetine to 40 mg for anxiety/depression in the evening hours. We will increase Adderall XR to 20 mg bid and stop IR Adderall 10 mg in PM. Next visit in 3 months.The plan was discussed with patient who had an opportunity to ask questions and these were all answered. I [id number] in videoconferencingwith the patient.    Magdalene Patricialgierd A Frederica Chrestman, MD 10/22/2019, 9:46 AM

## 2019-11-19 ENCOUNTER — Encounter: Payer: Self-pay | Admitting: Family Medicine

## 2019-11-23 ENCOUNTER — Other Ambulatory Visit: Payer: Self-pay

## 2019-11-23 ENCOUNTER — Ambulatory Visit: Payer: Managed Care, Other (non HMO) | Admitting: Family Medicine

## 2019-11-23 ENCOUNTER — Encounter: Payer: Self-pay | Admitting: Family Medicine

## 2019-11-23 VITALS — BP 118/64 | HR 84 | Temp 98.7°F | Wt 174.0 lb

## 2019-11-23 DIAGNOSIS — L0291 Cutaneous abscess, unspecified: Secondary | ICD-10-CM | POA: Diagnosis not present

## 2019-11-23 DIAGNOSIS — W19XXXA Unspecified fall, initial encounter: Secondary | ICD-10-CM

## 2019-11-23 DIAGNOSIS — M25512 Pain in left shoulder: Secondary | ICD-10-CM

## 2019-11-23 DIAGNOSIS — M25522 Pain in left elbow: Secondary | ICD-10-CM

## 2019-11-23 NOTE — Progress Notes (Signed)
   Subjective:    Patient ID: Crystal Hammond, female    DOB: 10-21-1980, 39 y.o.   MRN: [id number]  HPI Chief Complaint  Patient presents with  . lump    Pt stated--lump near C-section area--discomfort, ---2 days   Patient with pain and swelling of left lower abdomen around c-section scar. Seems to be getting bigger.   Fell yesterday on her left elbow. Pain in her elbow and shoulder. Applied ice. Pain radiates from elbow to shoulder, upper chest. Ibuprofen 4-5 tablets x 1.    Review of Systems Per HPI    Objective:   Physical Exam Vitals reviewed.  Constitutional:      Appearance: Normal appearance. She is obese.  HENT:     Head: Normocephalic and atraumatic.  Cardiovascular:     Rate and Rhythm: Normal rate.  Pulmonary:     Effort: Pulmonary effort is normal.  Musculoskeletal:     Comments: Left shoulder with decreased ROM, generalized tenderness of shoulder, left scapula and clavicle. No defects palpated. Left elbow with slightly decreased ROM, bruising.   Skin:    General: Skin is warm and dry.     Comments: Left lower abdomen, under pannus with approximately 3 cm, fluctuant lesion palpated. Tender. Visible with standing. No erythema.   Neurological:     Mental Status: She is alert and oriented to person, place, and time.  Psychiatric:        Mood and Affect: Mood normal.        Behavior: Behavior normal.        Thought Content: Thought content normal.        Judgment: Judgment normal.      BP 118/64   Pulse 84   Temp 98.7 F (37.1 C)   Wt 174 lb (78.9 kg)   SpO2 98%   BMI 33.98 kg/m  Wt Readings from Last 3 Encounters:  11/23/19 174 lb (78.9 kg)  07/24/19 183 lb 12.8 oz (83.4 kg)  06/17/19 194 lb 12.8 oz (88.4 kg)        Assessment & Plan:  1. Abscess - will get her in today with general surgery, due to location and size, I do not feel comfortable doing I&D in office - Ambulatory referral to General Surgery  2. Fall, initial encounter - no  LOC  3. Left elbow pain - offered her xray in office today, she declined. Will continue ice today, ibuprofen/ acetaminophen, encouraged her to do gentle ROM - follow up if no improvement in a couple of days  4. Acute pain of left shoulder - per #3  This visit occurred during the SARS-CoV-2 public health emergency.  Safety protocols were in place, including screening questions prior to the visit, additional usage of staff PPE, and extensive cleaning of exam room while observing appropriate contact time as indicated for disinfecting solutions.      Olean Ree, FNP-BC  [facility], [facility]  11/23/2019 9:22 AM

## 2019-11-23 NOTE — Patient Instructions (Signed)
Good to see you today  Please continue icing shoulder, elbow today, then can switch to heat.   Ibuprofen 800 mg every 8 - 12 hours, can alternate with 2 tylenol- let me know if worse or not better, consider xrays

## 2019-11-25 ENCOUNTER — Other Ambulatory Visit: Payer: Self-pay | Admitting: Student

## 2019-11-25 ENCOUNTER — Ambulatory Visit
Admission: RE | Admit: 2019-11-25 | Discharge: 2019-11-25 | Disposition: A | Discharge status: Home / Self Care | Payer: Managed Care, Other (non HMO) | Source: Ambulatory Visit | Attending: Student | Admitting: Student

## 2019-11-25 DIAGNOSIS — R109 Unspecified abdominal pain: Secondary | ICD-10-CM

## 2019-12-07 ENCOUNTER — Encounter: Payer: Self-pay | Admitting: Family Medicine

## 2019-12-08 ENCOUNTER — Ambulatory Visit: Payer: Self-pay | Admitting: General Surgery

## 2019-12-08 NOTE — H&P (Signed)
PATIENT PROFILE: Denea Cheaney is a 39 y.o. female who presents to the Clinic for evaluation of incisional hernia.  PCP:  Elby Beck, NP  HISTORY OF PRESENT ILLNESS: Ms. Lobello reports having lower abdominal pain for few weeks.  She reported the pain has been getting worse in the last few days.  She went to see her primary care for evaluation of the lower abdominal pain.  Abscess of the old scar was suspected.  Patient was referred to general surgery for evaluation of possible abscess and CT scan of the abdomen and pelvis was done.  CT scan of the abdomen pelvis showed an incisional hernia with bowel content.  I personally evaluated the images of the CT scan.  Patient report associated pain in the lower abdomen.  There is no pain radiation.  Pain is aggravated by certain abdominal wall movements.  Patient denies any nausea or vomiting.  Patient reports tolerating diet.   PROBLEM LIST:        Problem List  Date Reviewed: 09/04/2019       Noted   History of COVID-19 07/26/2019   Prediabetes 07/26/2019   Adult ADHD 06/25/2019   Cesarean delivery delivered 12/26/2017   Diet controlled gestational diabetes mellitus (GDM) in third trimester 05/27/2017   Overview    Early 1 hr BS: 141 at 9 wks, HgbA1c: 5.3 3 hr GTT: 80-206-153-155 at 11 wks on 06/10/17  Diet and exercise controlled  Normal fetal ECHO  Last Assessment & Plan:  Did not bring BS log.  States fastings are in the high 80s, and 1 hr PP in 110s.  No values out of range.  Controlled by diet and exercise, will continue this management.      Herpes 05/27/2017   Overview    Hx positive serology, no history of outbreaks - suppression not indicated given no prior history of genital outbreaks  Last Assessment & Plan:  No outbreaks      Migraine without aura, not intractable 05/27/2017   Overview    Formatting of this note might be different from the original. History Triggers: strong scents, stress  and some tastes as her frequent triggers.   Prior to pregnancy, she treated her headaches with an intranasal spray (she cannot recall name).  Plan:  Magnesium/riboflavin prophylaxis Tylenol prn Monitor Recommended to discontinue nasal spray, may call or bring to future visit to determine safety in pregnancy   Last Assessment & Plan:  Formatting of this note might be different from the original. Occasional headache, manageable.      Acute right-sided low back pain without sciatica 04/29/2017   Overview    Last Assessment & Plan:  --likely due to new pregnancy. Advised warm compresses to the area, stretches. 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 Olen Cordial, last appointment 11/28/17  Last Assessment & Plan:  Mood stable.  Continues with care with [facility]. Last Assessment & Plan:  -not on medication at this time.  She prefers to go to counseling. Patient with positive pregnancy test. If she does prefer to go  on medication in future will consider zoloft.      History of kidney stones 01/31/2016   Overview    In previous pregnancies, passed spontaneously No hx lithotripsy or other treatment  Last Assessment & Plan:  In previous pregnancies, passed spontaneously No hx lithotripsy or other treatment         GENERAL REVIEW OF SYSTEMS:   General ROS: negative for - chills, fatigue, fever, weight gain or weight loss Allergy and Immunology ROS: negative for - hives  Hematological and Lymphatic ROS: negative for - bleeding problems or  bruising, negative for palpable nodes Endocrine ROS: negative for - heat or cold intolerance, hair changes Respiratory ROS: negative for - cough, shortness of breath or wheezing Cardiovascular ROS: no chest pain or palpitations GI ROS: negative for nausea, vomiting, diarrhea, constipation.  Positive for abdominal pain. Musculoskeletal ROS: negative for - joint swelling or muscle pain Neurological ROS: negative for - confusion, syncope Dermatological ROS: negative for pruritus and rash Psychiatric: negative for anxiety, depression, difficulty sleeping and memory loss  MEDICATIONS: Current Medications        Current Outpatient Medications  Medication Sig Dispense Refill  . albuterol (PROVENTIL) 2.5 mg /3 mL (0.083 %) nebulizer solution Inhale into the lungs as needed    . dextroamphetamine-amphetamine (ADDERALL XR) 20 MG XR capsule Take by mouth 2 (two) times daily    . FLUoxetine (PROZAC) 40 MG capsule Take 40 mg by mouth once daily    . ibuprofen (MOTRIN) 400 MG tablet Take 400 mg by mouth 3 (three) times daily as needed     No current facility-administered medications for this visit.      ALLERGIES: Diphenhydramine hcl, Phenazopyridine, and Shellfish containing products  PAST MEDICAL HISTORY:     Past Medical History:  Diagnosis Date  . Gastroschisis, congenital     PAST SURGICAL HISTORY:      Past Surgical History:  Procedure Laterality Date  . CESAREAN SECTION  2007, 2014, 2016, 2019  . LAPAROSCOPIC ENDOMETRIOSIS FULGURATION  2011  . revision of scar  1998   due to Gastrochisis     FAMILY HISTORY:      Family History  Problem Relation Age of Onset  . Alcohol abuse Mother   . Depression Mother   . Thyroid disease Mother   . Alcohol abuse Father   . Depression Father   . Cancer Maternal Aunt   . Cancer Maternal Uncle   . Diabetes Maternal Grandmother   . Diabetes Maternal Grandfather   . Cancer Maternal Grandfather   . Diabetes  Paternal Grandmother   . Diabetes Paternal Grandfather      SOCIAL HISTORY: Social History          Socioeconomic History  . Marital status: Married    Spouse name: Not on file  . Number of children: Not on file  . Years of education: Not on file  . Highest education level: Not on file  Occupational History  . Not on file  Tobacco Use  . Smoking status: Never Smoker  . Smokeless tobacco: Never Used  Vaping Use  . Vaping Use: Never used  Substance and Sexual Activity  . Alcohol use: Yes    Comment: very rare  . Drug use: No  . Sexual activity: Yes  Other Topics Concern  . Not on file  Social History Narrative  . Not on file   Social Determinants of Health      Financial Resource Strain:   . Difficulty of  Paying Living Expenses:   Food Insecurity:   . Worried About Charity fundraiser in the Last Year:   . Arboriculturist in the Last Year:   Transportation Needs:   . Film/video editor (Medical):   Marland Kitchen Lack of Transportation (Non-Medical):       PHYSICAL EXAM:    Vitals:   12/08/19 0811  BP: 128/76  Pulse: 90   Body mass index is 33.59 kg/m. Weight: 78 kg (172 lb)   GENERAL: Alert, active, oriented x3  HEENT: Pupils equal reactive to light. Extraocular movements are intact. Sclera clear. Palpebral conjunctiva normal red color.Pharynx clear.  NECK: Supple with no palpable mass and no adenopathy.  LUNGS: Sound clear with no rales rhonchi or wheezes.  HEART: Regular rhythm S1 and S2 without murmur.  ABDOMEN: Soft and depressible, nontender with no palpable mass, no hepatomegaly.  Palpable incisional hernia in the lower abdomen unable to be completely reduced.  Mild tender to palpation.  EXTREMITIES: Well-developed well-nourished symmetrical with no dependent edema.  NEUROLOGICAL: Awake alert oriented, facial expression symmetrical, moving all extremities.  REVIEW OF DATA: I have reviewed the following data today:      No  visits with results within 3 Month(s) from this visit.  Latest known visit with results is:  Office Visit on 09/04/2019  Component Date Value  . Glucose 09/04/2019 102   . Sodium 09/04/2019 138   . Potassium 09/04/2019 3.8   . Chloride 09/04/2019 103   . Carbon Dioxide (CO2) 09/04/2019 30.1   . Urea Nitrogen (BUN) 09/04/2019 10   . Creatinine 09/04/2019 0.7   . Glomerular Filtration Ra* 09/04/2019 94   . Calcium 09/04/2019 8.7   . AST  09/04/2019 17   . ALT  09/04/2019 17   . Alk Phos (alkaline Phosp* 09/04/2019 56   . Albumin 09/04/2019 4.2   . Bilirubin, Total 09/04/2019 0.5   . Protein, Total 09/04/2019 7.0   . A/G Ratio 09/04/2019 1.5   . Thyroid Stimulating Horm* 09/04/2019 1.216   . WBC (White Blood Cell Co* 09/04/2019 7.6   . RBC (Red Blood Cell Coun* 09/04/2019 4.52   . Hemoglobin 09/04/2019 12.6   . Hematocrit 09/04/2019 38.8   . MCV (Mean Corpuscular Vo* 09/04/2019 85.8   . MCH (Mean Corpuscular He* 09/04/2019 27.9   . MCHC (Mean Corpuscular H* 09/04/2019 32.5   . Platelet Count 09/04/2019 257   . RDW-CV (Red Cell Distrib* 09/04/2019 12.6   . MPV (Mean Platelet Volum* 09/04/2019 8.9*  . Neutrophils 09/04/2019 4.60   . Lymphocytes 09/04/2019 2.18   . Monocytes 09/04/2019 0.73   . Eosinophils 09/04/2019 0.07   . Basophils 09/04/2019 0.01   . Neutrophil % 09/04/2019 60.6   . Lymphocyte % 09/04/2019 28.7   . Monocyte % 09/04/2019 9.6   . Eosinophil % 09/04/2019 0.9*  . Basophil% 09/04/2019 0.1   . Immature Granulocyte % 09/04/2019 0.1   . Immature Granulocyte Cou* 09/04/2019 0.01   . Sedimentation Rate-Autom* 09/04/2019 14   . Color 09/04/2019 Yellow   . Clarity 09/04/2019 Clear   . Specific Gravity 09/04/2019 1.010   . pH, Urine 09/04/2019 6.0   . Protein, Urinalysis 09/04/2019 Negative   . Glucose, Urinalysis 09/04/2019 Negative   . Ketones, Urinalysis 09/04/2019 Negative   . Blood, Urinalysis 09/04/2019 Trace*  . Nitrite, Urinalysis 09/04/2019 Negative    . Leukocyte Esterase, Urin* 09/04/2019 Negative   . White Blood Cells, Urina* 09/04/2019 0-3   . Red  Blood Cells, Urinaly* 09/04/2019 0-3   . Bacteria, Urinalysis 09/04/2019 None Seen   . Squamous Epithelial Cell* 09/04/2019 Few   . Pregnancy Test (HCG), Qu* 09/04/2019 Negative      ASSESSMENT: Ms. Trafton is a 39 y.o. female presenting for consultation for incisional hernia.  Patient found with physical exam and imaging consistent with incisional hernia.  Incisional hernia coming from lower abdominal scar from previous 4 C-sections.  Patient has a large scar in the mid abdomen from gastroschisis.  I personally evaluated the CT scan and I was able to identify the lower abdominal incisional hernia with small bowel content.  There is no sign of obstruction.  I am concerned about significant adhesions intermittent abdomen scar.  I discussed with the patient.  Discussion about the difficulty of the case was taken.  I discussed with the patient the risk of the surgery including injury to small bowel, pain, poor obstruction, abscess, infection, bleeding, injury to bladder, need to proceed with open surgery, among others.  The patient understood and agreed to proceed.  Incisional hernia, without obstruction or gangrene [K43.2]  PLAN: 1. Robotic assisted laparoscopic vs open incisional hernia repair with mesh ([id number], [id number], [id number]) 2. CBC, CMP (Done) 3. Avoid taking aspirin 5 days before surgery 4. Contact us if you have any concern.   Patient and her husband verbalized understanding, all questions were answered, and were agreeable with the plan outlined above.     Herbert Pun, MD  Electronically signed by Herbert Pun, MD

## 2019-12-08 NOTE — H&P (View-Only) (Signed)
PATIENT PROFILE: Crystal Hammond is a 39 y.o. female who presents to the Clinic for evaluation of incisional hernia.  PCP:  Elby Beck, NP  HISTORY OF PRESENT ILLNESS: Crystal Hammond reports having lower abdominal pain for few weeks.  She reported the pain has been getting worse in the last few days.  She went to see her primary care for evaluation of the lower abdominal pain.  Abscess of the old scar was suspected.  Patient was referred to general surgery for evaluation of possible abscess and CT scan of the abdomen and pelvis was done.  CT scan of the abdomen pelvis showed an incisional hernia with bowel content.  I personally evaluated the images of the CT scan.  Patient report associated pain in the lower abdomen.  There is no pain radiation.  Pain is aggravated by certain abdominal wall movements.  Patient denies any nausea or vomiting.  Patient reports tolerating diet.   PROBLEM LIST:        Problem List  Date Reviewed: 09/04/2019       Noted   History of COVID-19 07/26/2019   Prediabetes 07/26/2019   Adult ADHD 06/25/2019   Cesarean delivery delivered 12/26/2017   Diet controlled gestational diabetes mellitus (GDM) in third trimester 05/27/2017   Overview    Early 1 hr BS: 141 at 9 wks, HgbA1c: 5.3 3 hr GTT: 80-206-153-155 at 11 wks on 06/10/17  Diet and exercise controlled  Normal fetal ECHO  Last Assessment & Plan:  Did not bring BS log.  States fastings are in the high 80s, and 1 hr PP in 110s.  No values out of range.  Controlled by diet and exercise, will continue this management.      Herpes 05/27/2017   Overview    Hx positive serology, no history of outbreaks - suppression not indicated given no prior history of genital outbreaks  Last Assessment & Plan:  No outbreaks      Migraine without aura, not intractable 05/27/2017   Overview    Formatting of this note might be different from the original. History Triggers: strong scents, stress  and some tastes as her frequent triggers.   Prior to pregnancy, she treated her headaches with an intranasal spray (she cannot recall name).  Plan:  Magnesium/riboflavin prophylaxis Tylenol prn Monitor Recommended to discontinue nasal spray, may call or bring to future visit to determine safety in pregnancy   Last Assessment & Plan:  Formatting of this note might be different from the original. Occasional headache, manageable.      Acute right-sided low back pain without sciatica 04/29/2017   Overview    Last Assessment & Plan:  --likely due to new pregnancy. Advised warm compresses to the area, stretches. 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 Olen Cordial, last appointment 11/28/17  Last Assessment & Plan:  Mood stable.  Continues with care with [facility]. Last Assessment & Plan:  -not on medication at this time.  She prefers to go to counseling. Patient with positive pregnancy test. If she does prefer to go  on medication in future will consider zoloft.      History of kidney stones 01/31/2016   Overview    In previous pregnancies, passed spontaneously No hx lithotripsy or other treatment  Last Assessment & Plan:  In previous pregnancies, passed spontaneously No hx lithotripsy or other treatment         GENERAL REVIEW OF SYSTEMS:   General ROS: negative for - chills, fatigue, fever, weight gain or weight loss Allergy and Immunology ROS: negative for - hives  Hematological and Lymphatic ROS: negative for - bleeding problems or  bruising, negative for palpable nodes Endocrine ROS: negative for - heat or cold intolerance, hair changes Respiratory ROS: negative for - cough, shortness of breath or wheezing Cardiovascular ROS: no chest pain or palpitations GI ROS: negative for nausea, vomiting, diarrhea, constipation.  Positive for abdominal pain. Musculoskeletal ROS: negative for - joint swelling or muscle pain Neurological ROS: negative for - confusion, syncope Dermatological ROS: negative for pruritus and rash Psychiatric: negative for anxiety, depression, difficulty sleeping and memory loss  MEDICATIONS: Current Medications        Current Outpatient Medications  Medication Sig Dispense Refill  . albuterol (PROVENTIL) 2.5 mg /3 mL (0.083 %) nebulizer solution Inhale into the lungs as needed    . dextroamphetamine-amphetamine (ADDERALL XR) 20 MG XR capsule Take by mouth 2 (two) times daily    . FLUoxetine (PROZAC) 40 MG capsule Take 40 mg by mouth once daily    . ibuprofen (MOTRIN) 400 MG tablet Take 400 mg by mouth 3 (three) times daily as needed     No current facility-administered medications for this visit.      ALLERGIES: Diphenhydramine hcl, Phenazopyridine, and Shellfish containing products  PAST MEDICAL HISTORY:     Past Medical History:  Diagnosis Date  . Gastroschisis, congenital     PAST SURGICAL HISTORY:      Past Surgical History:  Procedure Laterality Date  . CESAREAN SECTION  2007, 2014, 2016, 2019  . LAPAROSCOPIC ENDOMETRIOSIS FULGURATION  2011  . revision of scar  1998   due to Gastrochisis     FAMILY HISTORY:      Family History  Problem Relation Age of Onset  . Alcohol abuse Mother   . Depression Mother   . Thyroid disease Mother   . Alcohol abuse Father   . Depression Father   . Cancer Maternal Aunt   . Cancer Maternal Uncle   . Diabetes Maternal Grandmother   . Diabetes Maternal Grandfather   . Cancer Maternal Grandfather   . Diabetes  Paternal Grandmother   . Diabetes Paternal Grandfather      SOCIAL HISTORY: Social History          Socioeconomic History  . Marital status: Married    Spouse name: Not on file  . Number of children: Not on file  . Years of education: Not on file  . Highest education level: Not on file  Occupational History  . Not on file  Tobacco Use  . Smoking status: Never Smoker  . Smokeless tobacco: Never Used  Vaping Use  . Vaping Use: Never used  Substance and Sexual Activity  . Alcohol use: Yes    Comment: very rare  . Drug use: No  . Sexual activity: Yes  Other Topics Concern  . Not on file  Social History Narrative  . Not on file   Social Determinants of Health      Financial Resource Strain:   . Difficulty of  Paying Living Expenses:   Food Insecurity:   . Worried About Charity fundraiser in the Last Year:   . Arboriculturist in the Last Year:   Transportation Needs:   . Film/video editor (Medical):   Marland Kitchen Lack of Transportation (Non-Medical):       PHYSICAL EXAM:    Vitals:   12/08/19 0811  BP: 128/76  Pulse: 90   Body mass index is 33.59 kg/m. Weight: 78 kg (172 lb)   GENERAL: Alert, active, oriented x3  HEENT: Pupils equal reactive to light. Extraocular movements are intact. Sclera clear. Palpebral conjunctiva normal red color.Pharynx clear.  NECK: Supple with no palpable mass and no adenopathy.  LUNGS: Sound clear with no rales rhonchi or wheezes.  HEART: Regular rhythm S1 and S2 without murmur.  ABDOMEN: Soft and depressible, nontender with no palpable mass, no hepatomegaly.  Palpable incisional hernia in the lower abdomen unable to be completely reduced.  Mild tender to palpation.  EXTREMITIES: Well-developed well-nourished symmetrical with no dependent edema.  NEUROLOGICAL: Awake alert oriented, facial expression symmetrical, moving all extremities.  REVIEW OF DATA: I have reviewed the following data today:      No  visits with results within 3 Month(s) from this visit.  Latest known visit with results is:  Office Visit on 09/04/2019  Component Date Value  . Glucose 09/04/2019 102   . Sodium 09/04/2019 138   . Potassium 09/04/2019 3.8   . Chloride 09/04/2019 103   . Carbon Dioxide (CO2) 09/04/2019 30.1   . Urea Nitrogen (BUN) 09/04/2019 10   . Creatinine 09/04/2019 0.7   . Glomerular Filtration Ra* 09/04/2019 94   . Calcium 09/04/2019 8.7   . AST  09/04/2019 17   . ALT  09/04/2019 17   . Alk Phos (alkaline Phosp* 09/04/2019 56   . Albumin 09/04/2019 4.2   . Bilirubin, Total 09/04/2019 0.5   . Protein, Total 09/04/2019 7.0   . A/G Ratio 09/04/2019 1.5   . Thyroid Stimulating Horm* 09/04/2019 1.216   . WBC (White Blood Cell Co* 09/04/2019 7.6   . RBC (Red Blood Cell Coun* 09/04/2019 4.52   . Hemoglobin 09/04/2019 12.6   . Hematocrit 09/04/2019 38.8   . MCV (Mean Corpuscular Vo* 09/04/2019 85.8   . MCH (Mean Corpuscular He* 09/04/2019 27.9   . MCHC (Mean Corpuscular H* 09/04/2019 32.5   . Platelet Count 09/04/2019 257   . RDW-CV (Red Cell Distrib* 09/04/2019 12.6   . MPV (Mean Platelet Volum* 09/04/2019 8.9*  . Neutrophils 09/04/2019 4.60   . Lymphocytes 09/04/2019 2.18   . Monocytes 09/04/2019 0.73   . Eosinophils 09/04/2019 0.07   . Basophils 09/04/2019 0.01   . Neutrophil % 09/04/2019 60.6   . Lymphocyte % 09/04/2019 28.7   . Monocyte % 09/04/2019 9.6   . Eosinophil % 09/04/2019 0.9*  . Basophil% 09/04/2019 0.1   . Immature Granulocyte % 09/04/2019 0.1   . Immature Granulocyte Cou* 09/04/2019 0.01   . Sedimentation Rate-Autom* 09/04/2019 14   . Color 09/04/2019 Yellow   . Clarity 09/04/2019 Clear   . Specific Gravity 09/04/2019 1.010   . pH, Urine 09/04/2019 6.0   . Protein, Urinalysis 09/04/2019 Negative   . Glucose, Urinalysis 09/04/2019 Negative   . Ketones, Urinalysis 09/04/2019 Negative   . Blood, Urinalysis 09/04/2019 Trace*  . Nitrite, Urinalysis 09/04/2019 Negative    . Leukocyte Esterase, Urin* 09/04/2019 Negative   . White Blood Cells, Urina* 09/04/2019 0-3   . Red  Blood Cells, Urinaly* 09/04/2019 0-3   . Bacteria, Urinalysis 09/04/2019 None Seen   . Squamous Epithelial Cell* 09/04/2019 Few   . Pregnancy Test (HCG), Qu* 09/04/2019 Negative      ASSESSMENT: Ms. Trafton is a 39 y.o. female presenting for consultation for incisional hernia.  Patient found with physical exam and imaging consistent with incisional hernia.  Incisional hernia coming from lower abdominal scar from previous 4 C-sections.  Patient has a large scar in the mid abdomen from gastroschisis.  I personally evaluated the CT scan and I was able to identify the lower abdominal incisional hernia with small bowel content.  There is no sign of obstruction.  I am concerned about significant adhesions intermittent abdomen scar.  I discussed with the patient.  Discussion about the difficulty of the case was taken.  I discussed with the patient the risk of the surgery including injury to small bowel, pain, poor obstruction, abscess, infection, bleeding, injury to bladder, need to proceed with open surgery, among others.  The patient understood and agreed to proceed.  Incisional hernia, without obstruction or gangrene [K43.2]  PLAN: 1. Robotic assisted laparoscopic vs open incisional hernia repair with mesh ([id number], [id number], [id number]) 2. CBC, CMP (Done) 3. Avoid taking aspirin 5 days before surgery 4. Contact us if you have any concern.   Patient and her husband verbalized understanding, all questions were answered, and were agreeable with the plan outlined above.     Herbert Pun, MD  Electronically signed by Herbert Pun, MD

## 2019-12-09 ENCOUNTER — Other Ambulatory Visit: Payer: Self-pay

## 2019-12-09 ENCOUNTER — Other Ambulatory Visit: Admission: RE | Admit: 2019-12-09 | Payer: Managed Care, Other (non HMO) | Source: Ambulatory Visit

## 2019-12-09 ENCOUNTER — Encounter
Admission: RE | Admit: 2019-12-09 | Discharge: 2019-12-09 | Disposition: A | Discharge status: Home / Self Care | Payer: Managed Care, Other (non HMO) | Source: Ambulatory Visit | Attending: General Surgery | Admitting: General Surgery

## 2019-12-09 HISTORY — DX: Pneumonia, unspecified organism: J18.9

## 2019-12-09 HISTORY — DX: Other complications of anesthesia, initial encounter: T88.59XA

## 2019-12-09 HISTORY — DX: Obsessive-compulsive disorder, unspecified: F42.9

## 2019-12-09 HISTORY — DX: Personal history of urinary calculi: Z87.442

## 2019-12-09 HISTORY — DX: Post-traumatic stress disorder, unspecified: F43.10

## 2019-12-09 HISTORY — DX: Prediabetes: R73.03

## 2019-12-09 NOTE — Patient Instructions (Signed)
Your procedure is scheduled on: 12/11/19 Report to [place]. To find out your arrival time please call [phone] between 1PM - 3PM on 12/10/19.  Remember: Instructions that are not followed completely may result in serious medical risk, up to and including death, or upon the discretion of your surgeon and anesthesiologist your surgery may need to be rescheduled.     _X__ 1. Do not eat food after midnight the night before your procedure.                 No gum chewing or hard candies. You may drink clear liquids up to 2 hours                 before you are scheduled to arrive for your surgery- DO not drink clear                 liquids within 2 hours of the start of your surgery.                 Clear Liquids include:  water, apple juice without pulp, clear carbohydrate                 drink such as Clearfast or Gatorade, Black Coffee or Tea (Do not add                 anything to coffee or tea). Diabetics water only  __X__2.  On the morning of surgery brush your teeth with toothpaste and water, you                 may rinse your mouth with mouthwash if you wish.  Do not swallow any              toothpaste of mouthwash.     _X__ 3.  No Alcohol for 24 hours before or after surgery.   _X__ 4.  Do Not Smoke or use e-cigarettes For 24 Hours Prior to Your Surgery.                 Do not use any chewable tobacco products for at least 6 hours prior to                 surgery.  ____  5.  Bring all medications with you on the day of surgery if instructed.   __X__  6.  Notify your doctor if there is any change in your medical condition      (cold, fever, infections).     Do not wear jewelry, make-up, hairpins, clips or nail polish. Do not wear lotions, powders, or perfumes.  Do not shave 48 hours prior to surgery. Men may shave face and neck. Do not bring valuables to the hospital.    [facility] is not responsible for any belongings or  valuables.  Contacts, dentures/partials or body piercings may not be worn into surgery. Bring a case for your contacts, glasses or hearing aids, a denture cup will be supplied. Leave your suitcase in the car. After surgery it may be brought to your room. For patients admitted to the hospital, discharge time is determined by your treatment team.   Patients discharged the day of surgery will not be allowed to drive home.   Please read over the following fact sheets that you were given:   MRSA Information  __X__ Take these medicines the morning of surgery with A SIP OF WATER:  1. NONE  2.   3.   4.  5.  6.  ____ Fleet Enema (as directed)   __X__ Use CHG Soap/SAGE wipes as directed  __X__ Use inhalers on the day of surgery  DO A NEBULIZER TREATMENT PRIOR TO ARRIVAL  ____ Stop metformin/Janumet/Farxiga 2 days prior to surgery    ____ Take 1/2 of usual insulin dose the night before surgery. No insulin the morning          of surgery.   ____ Stop Blood Thinners Coumadin/Plavix/Xarelto/Pleta/Pradaxa/Eliquis/Effient/Aspirin  on   Or contact your Surgeon, Cardiologist or Medical Doctor regarding  ability to stop your blood thinners  __X__ Stop Anti-inflammatories 7 days before surgery such as Advil, Ibuprofen, Motrin,  BC or Goodies Powder, Naprosyn, Naproxen, Aleve, Aspirin    __X__ Stop all herbal supplements, fish oil or vitamin E until after surgery.    ____ Bring C-Pap to the hospital.       How to Use Chlorhexidine for Bathing Chlorhexidine gluconate (CHG) is a germ-killing (antiseptic) solution that is used to clean the skin. It can get rid of the bacteria that normally live on the skin and can keep them away for about 24 hours. To clean your skin with CHG, you may be given:  A CHG solution to use in the shower or as part of a sponge bath.  A prepackaged cloth that contains CHG. Cleaning your skin with CHG may help lower the risk for infection:  While you are  staying in the intensive care unit of the hospital.  If you have a vascular access, such as a central line, to provide short-term or long-term access to your veins.  If you have a catheter to drain urine from your bladder.  If you are on a ventilator. A ventilator is a machine that helps you breathe by moving air in and out of your lungs.  After surgery. What are the risks? Risks of using CHG include:  A skin reaction.  Hearing loss, if CHG gets in your ears.  Eye injury, if CHG gets in your eyes and is not rinsed out.  The CHG product catching fire. Make sure that you avoid smoking and flames after applying CHG to your skin. Do not use CHG:  If you have a chlorhexidine allergy or have previously reacted to chlorhexidine.  On babies younger than 62 months of age. How to use CHG solution  Use CHG only as told by your health care provider, and follow the instructions on the label.  Use the full amount of CHG as directed. Usually, this is one bottle. During a shower Follow these steps when using CHG solution during a shower (unless your health care provider gives you different instructions): 1. Start the shower. 2. Use your normal soap and shampoo to wash your face and hair. 3. Turn off the shower or move out of the shower stream. 4. Pour the CHG onto a clean washcloth. Do not use any type of brush or rough-edged sponge. 5. Starting at your neck, lather your body down to your toes. Make sure you follow these instructions: ? If you will be having surgery, pay special attention to the part of your body where you will be having surgery. Scrub this area for at least 1 minute. ? Do not use CHG on your head or face. If the solution gets into your ears or eyes, rinse them well with water. ? Avoid your genital area. ? Avoid any areas of skin that have  broken skin, cuts, or scrapes. ? Scrub your back and under your arms. Make sure to wash skin folds. 6. Let the lather sit on your skin  for 1-2 minutes or as long as told by your health care provider. 7. Thoroughly rinse your entire body in the shower. Make sure that all body creases and crevices are rinsed well. 8. Dry off with a clean towel. Do not put any substances on your body afterward--such as powder, lotion, or perfume--unless you are told to do so by your health care provider. Only use lotions that are recommended by the manufacturer. 9. Put on clean clothes or pajamas. 10. If it is the night before your surgery, sleep in clean sheets. 11. REPEAT THE MORNING OF SURGERY

## 2019-12-10 ENCOUNTER — Other Ambulatory Visit: Payer: Managed Care, Other (non HMO)

## 2019-12-10 ENCOUNTER — Other Ambulatory Visit
Admission: RE | Admit: 2019-12-10 | Discharge: 2019-12-10 | Disposition: A | Discharge status: Home / Self Care | Payer: Managed Care, Other (non HMO) | Source: Ambulatory Visit | Attending: General Surgery | Admitting: General Surgery

## 2019-12-10 DIAGNOSIS — Z20822 Contact with and (suspected) exposure to covid-19: Secondary | ICD-10-CM | POA: Diagnosis not present

## 2019-12-10 DIAGNOSIS — Z01812 Encounter for preprocedural laboratory examination: Secondary | ICD-10-CM | POA: Insufficient documentation

## 2019-12-10 LAB — SARS CORONAVIRUS 2 (TAT 6-24 HRS): SARS Coronavirus 2: NEGATIVE

## 2019-12-11 ENCOUNTER — Encounter: Payer: Self-pay | Admitting: General Surgery

## 2019-12-11 ENCOUNTER — Ambulatory Visit: Payer: Managed Care, Other (non HMO) | Admitting: Anesthesiology

## 2019-12-11 ENCOUNTER — Other Ambulatory Visit: Payer: Self-pay

## 2019-12-11 ENCOUNTER — Encounter
Admission: RE | Disposition: A | Discharge status: Home / Self Care | Payer: Self-pay | Source: Home / Self Care | Attending: General Surgery

## 2019-12-11 ENCOUNTER — Observation Stay
Admission: RE | Admit: 2019-12-11 | Discharge: 2019-12-13 | Disposition: A | Discharge status: Home / Self Care | Payer: Managed Care, Other (non HMO) | Attending: General Surgery | Admitting: General Surgery

## 2019-12-11 DIAGNOSIS — K432 Incisional hernia without obstruction or gangrene: Secondary | ICD-10-CM | POA: Diagnosis not present

## 2019-12-11 DIAGNOSIS — Z87891 Personal history of nicotine dependence: Secondary | ICD-10-CM | POA: Insufficient documentation

## 2019-12-11 DIAGNOSIS — K430 Incisional hernia with obstruction, without gangrene: Principal | ICD-10-CM | POA: Insufficient documentation

## 2019-12-11 HISTORY — PX: XI ROBOTIC ASSISTED VENTRAL HERNIA: SHX6789

## 2019-12-11 HISTORY — DX: Other specified postprocedural states: R11.2

## 2019-12-11 HISTORY — DX: Nausea with vomiting, unspecified: R11.2

## 2019-12-11 HISTORY — PX: VENTRAL HERNIA REPAIR: SHX424

## 2019-12-11 HISTORY — DX: Other specified postprocedural states: Z98.890

## 2019-12-11 LAB — POCT PREGNANCY, URINE: Preg Test, Ur: NEGATIVE

## 2019-12-11 SURGERY — REPAIR, HERNIA, VENTRAL, ROBOT-ASSISTED
Anesthesia: General | Site: Abdomen

## 2019-12-11 MED ORDER — OXYCODONE HCL 5 MG PO TABS: 5.0000 mg | ORAL_TABLET | Freq: Once | ORAL | Status: DC | PRN

## 2019-12-11 MED ORDER — FENTANYL CITRATE (PF) 100 MCG/2ML IJ SOLN
INTRAMUSCULAR | Status: DC | PRN
Start: 2019-12-11 — End: 2019-12-11
  Administered 2019-12-11: 50 ug via INTRAVENOUS
  Administered 2019-12-11: 100 ug via INTRAVENOUS
  Administered 2019-12-11: 50 ug via INTRAVENOUS

## 2019-12-11 MED ORDER — HYDROMORPHONE HCL 1 MG/ML IJ SOLN
INTRAMUSCULAR | Status: AC
  Filled 2019-12-11: qty 1

## 2019-12-11 MED ORDER — CHLORHEXIDINE GLUCONATE 0.12 % MT SOLN
OROMUCOSAL | Status: AC
  Administered 2019-12-11: 15 mL
  Filled 2019-12-11: qty 15

## 2019-12-11 MED ORDER — ALBUTEROL SULFATE (2.5 MG/3ML) 0.083% IN NEBU: 2.5000 mg | INHALATION_SOLUTION | RESPIRATORY_TRACT | Status: DC | PRN

## 2019-12-11 MED ORDER — ONDANSETRON HCL 4 MG/2ML IJ SOLN
INTRAMUSCULAR | Status: DC | PRN
  Administered 2019-12-11: 4 mg via INTRAVENOUS

## 2019-12-11 MED ORDER — LIDOCAINE HCL (CARDIAC) PF 100 MG/5ML IV SOSY
PREFILLED_SYRINGE | INTRAVENOUS | Status: DC | PRN
  Administered 2019-12-11: 80 mg via INTRAVENOUS
  Administered 2019-12-11 (×2): 100 mg via INTRAVENOUS

## 2019-12-11 MED ORDER — SUGAMMADEX SODIUM 200 MG/2ML IV SOLN
INTRAVENOUS | Status: DC | PRN
  Administered 2019-12-11: 156 mg via INTRAVENOUS

## 2019-12-11 MED ORDER — KETOROLAC TROMETHAMINE 30 MG/ML IJ SOLN
INTRAMUSCULAR | Status: DC | PRN
  Administered 2019-12-11: 30 mg via INTRAVENOUS

## 2019-12-11 MED ORDER — PROPOFOL 10 MG/ML IV BOLUS
INTRAVENOUS | Status: DC | PRN
  Administered 2019-12-11: 160 mg via INTRAVENOUS

## 2019-12-11 MED ORDER — BUPIVACAINE LIPOSOME 1.3 % IJ SUSP
INTRAMUSCULAR | Status: AC
  Filled 2019-12-11: qty 20

## 2019-12-11 MED ORDER — MIDAZOLAM HCL 2 MG/2ML IJ SOLN
INTRAMUSCULAR | Status: AC
  Filled 2019-12-11: qty 2

## 2019-12-11 MED ORDER — SCOPOLAMINE 1 MG/3DAYS TD PT72
MEDICATED_PATCH | TRANSDERMAL | Status: AC
  Administered 2019-12-11: 1.5 mg
  Filled 2019-12-11: qty 1

## 2019-12-11 MED ORDER — GABAPENTIN 300 MG PO CAPS
300.0000 mg | ORAL_CAPSULE | Freq: Two times a day (BID) | ORAL | Status: DC
  Administered 2019-12-11 – 2019-12-13 (×4): 300 mg via ORAL
  Filled 2019-12-11 (×4): qty 1

## 2019-12-11 MED ORDER — BUPIVACAINE-EPINEPHRINE (PF) 0.25% -1:200000 IJ SOLN
INTRAMUSCULAR | Status: DC | PRN
  Administered 2019-12-11: 10 mL via PERINEURAL

## 2019-12-11 MED ORDER — ACETAMINOPHEN 10 MG/ML IV SOLN
INTRAVENOUS | Status: AC
  Filled 2019-12-11: qty 100

## 2019-12-11 MED ORDER — FAMOTIDINE 20 MG PO TABS
20.0000 mg | ORAL_TABLET | Freq: Once | ORAL | Status: AC
  Administered 2019-12-13: 20 mg via ORAL
  Filled 2019-12-11 (×2): qty 1

## 2019-12-11 MED ORDER — ACETAMINOPHEN 10 MG/ML IV SOLN
INTRAVENOUS | Status: DC | PRN
  Administered 2019-12-11: 1000 mg via INTRAVENOUS

## 2019-12-11 MED ORDER — DEXMEDETOMIDINE HCL IN NACL 400 MCG/100ML IV SOLN
INTRAVENOUS | Status: DC | PRN
  Administered 2019-12-11: 4 ug via INTRAVENOUS
  Administered 2019-12-11 (×2): 8 ug via INTRAVENOUS

## 2019-12-11 MED ORDER — ONDANSETRON HCL 4 MG/2ML IJ SOLN
INTRAMUSCULAR | Status: AC
  Filled 2019-12-11: qty 2

## 2019-12-11 MED ORDER — LIDOCAINE HCL (PF) 2 % IJ SOLN
INTRAMUSCULAR | Status: AC
  Filled 2019-12-11: qty 5

## 2019-12-11 MED ORDER — ORAL CARE MOUTH RINSE: 15.0000 mL | Freq: Once | OROMUCOSAL | Status: DC

## 2019-12-11 MED ORDER — ONDANSETRON HCL 4 MG/2ML IJ SOLN: 4.0000 mg | Freq: Four times a day (QID) | INTRAMUSCULAR | Status: DC | PRN

## 2019-12-11 MED ORDER — ROCURONIUM BROMIDE 100 MG/10ML IV SOLN
INTRAVENOUS | Status: DC | PRN
  Administered 2019-12-11: 10 mg via INTRAVENOUS
  Administered 2019-12-11: 5 mg via INTRAVENOUS
  Administered 2019-12-11 (×2): 10 mg via INTRAVENOUS
  Administered 2019-12-11: 50 mg via INTRAVENOUS
  Administered 2019-12-11: 20 mg via INTRAVENOUS
  Administered 2019-12-11: 10 mg via INTRAVENOUS

## 2019-12-11 MED ORDER — KETAMINE HCL 50 MG/ML IJ SOLN
INTRAMUSCULAR | Status: DC | PRN
  Administered 2019-12-11: 30 mg via INTRAMUSCULAR

## 2019-12-11 MED ORDER — BUPIVACAINE-EPINEPHRINE (PF) 0.25% -1:200000 IJ SOLN
INTRAMUSCULAR | Status: AC
  Filled 2019-12-11: qty 30

## 2019-12-11 MED ORDER — SODIUM CHLORIDE FLUSH 0.9 % IV SOLN
INTRAVENOUS | Status: AC
  Filled 2019-12-11: qty 50

## 2019-12-11 MED ORDER — SEVOFLURANE IN SOLN
RESPIRATORY_TRACT | Status: AC
  Filled 2019-12-11: qty 250

## 2019-12-11 MED ORDER — FAMOTIDINE 20 MG PO TABS
ORAL_TABLET | ORAL | Status: AC
  Administered 2019-12-11: 20 mg
  Filled 2019-12-11: qty 1

## 2019-12-11 MED ORDER — PROMETHAZINE HCL 25 MG/ML IJ SOLN: 6.2500 mg | INTRAMUSCULAR | Status: DC | PRN

## 2019-12-11 MED ORDER — DEXAMETHASONE SODIUM PHOSPHATE 10 MG/ML IJ SOLN
INTRAMUSCULAR | Status: DC | PRN
  Administered 2019-12-11: 10 mg via INTRAVENOUS
  Administered 2019-12-11: 5 mg via INTRAVENOUS

## 2019-12-11 MED ORDER — SCOPOLAMINE 1 MG/3DAYS TD PT72: 1.0000 | MEDICATED_PATCH | TRANSDERMAL | Status: DC

## 2019-12-11 MED ORDER — VISTASEAL 10 ML SINGLE DOSE KIT
PACK | CUTANEOUS | Status: DC | PRN
  Administered 2019-12-11: 10 mL via TOPICAL

## 2019-12-11 MED ORDER — LACTATED RINGERS IV SOLN: INTRAVENOUS | Status: DC

## 2019-12-11 MED ORDER — ONDANSETRON 4 MG PO TBDP: 4.0000 mg | ORAL_TABLET | Freq: Four times a day (QID) | ORAL | Status: DC | PRN

## 2019-12-11 MED ORDER — FENTANYL CITRATE (PF) 100 MCG/2ML IJ SOLN
25.0000 ug | INTRAMUSCULAR | Status: DC | PRN
  Administered 2019-12-11: 25 ug via INTRAVENOUS

## 2019-12-11 MED ORDER — SODIUM CHLORIDE 0.9 % IV SOLN: INTRAVENOUS | Status: DC | PRN

## 2019-12-11 MED ORDER — FLUOXETINE HCL 20 MG PO CAPS
40.0000 mg | ORAL_CAPSULE | Freq: Every day | ORAL | Status: DC
  Administered 2019-12-11 – 2019-12-12 (×2): 40 mg via ORAL
  Filled 2019-12-11 (×3): qty 2

## 2019-12-11 MED ORDER — CEFAZOLIN SODIUM-DEXTROSE 2-4 GM/100ML-% IV SOLN
2.0000 g | INTRAVENOUS | Status: AC
  Administered 2019-12-11: 2 g via INTRAVENOUS

## 2019-12-11 MED ORDER — AMPHETAMINE-DEXTROAMPHET ER 5 MG PO CP24
20.0000 mg | ORAL_CAPSULE | Freq: Two times a day (BID) | ORAL | Status: DC
  Administered 2019-12-12 – 2019-12-13 (×4): 20 mg via ORAL
  Filled 2019-12-11 (×4): qty 4

## 2019-12-11 MED ORDER — PROPOFOL 10 MG/ML IV BOLUS
INTRAVENOUS | Status: AC
  Filled 2019-12-11: qty 20

## 2019-12-11 MED ORDER — ENOXAPARIN SODIUM 40 MG/0.4ML ~~LOC~~ SOLN
40.0000 mg | SUBCUTANEOUS | Status: DC
  Administered 2019-12-12 – 2019-12-13 (×2): 40 mg via SUBCUTANEOUS
  Filled 2019-12-11 (×2): qty 0.4

## 2019-12-11 MED ORDER — SODIUM CHLORIDE 0.9 % IV SOLN: INTRAVENOUS | Status: DC

## 2019-12-11 MED ORDER — MIDAZOLAM HCL 2 MG/2ML IJ SOLN
INTRAMUSCULAR | Status: DC | PRN
  Administered 2019-12-11: 2 mg via INTRAVENOUS

## 2019-12-11 MED ORDER — HYDROMORPHONE HCL 1 MG/ML IJ SOLN
INTRAMUSCULAR | Status: DC | PRN
  Administered 2019-12-11: .5 mg via INTRAVENOUS

## 2019-12-11 MED ORDER — FENTANYL CITRATE (PF) 100 MCG/2ML IJ SOLN
INTRAMUSCULAR | Status: AC
Start: 2019-12-11 — End: 2019-12-11
  Administered 2019-12-11: 25 ug via INTRAVENOUS
  Filled 2019-12-11: qty 2

## 2019-12-11 MED ORDER — PANTOPRAZOLE SODIUM 40 MG IV SOLR
40.0000 mg | Freq: Every day | INTRAVENOUS | Status: DC
  Administered 2019-12-11 – 2019-12-12 (×2): 40 mg via INTRAVENOUS
  Filled 2019-12-11 (×2): qty 40

## 2019-12-11 MED ORDER — MORPHINE SULFATE (PF) 4 MG/ML IV SOLN
4.0000 mg | INTRAVENOUS | Status: DC | PRN
  Administered 2019-12-11 – 2019-12-13 (×4): 4 mg via INTRAVENOUS
  Filled 2019-12-11 (×4): qty 1

## 2019-12-11 MED ORDER — DEXAMETHASONE SODIUM PHOSPHATE 10 MG/ML IJ SOLN
INTRAMUSCULAR | Status: AC
  Filled 2019-12-11: qty 1

## 2019-12-11 MED ORDER — CEFAZOLIN SODIUM-DEXTROSE 2-4 GM/100ML-% IV SOLN
INTRAVENOUS | Status: AC
  Filled 2019-12-11: qty 100

## 2019-12-11 MED ORDER — CHLORHEXIDINE GLUCONATE 0.12 % MT SOLN: 15.0000 mL | Freq: Once | OROMUCOSAL | Status: DC

## 2019-12-11 MED ORDER — OXYCODONE HCL 5 MG PO TABS
5.0000 mg | ORAL_TABLET | ORAL | Status: DC | PRN
  Administered 2019-12-12 – 2019-12-13 (×5): 5 mg via ORAL
  Filled 2019-12-11 (×2): qty 1
  Filled 2019-12-11: qty 2
  Filled 2019-12-11 (×2): qty 1

## 2019-12-11 MED ORDER — FENTANYL CITRATE (PF) 100 MCG/2ML IJ SOLN
INTRAMUSCULAR | Status: AC
  Filled 2019-12-11: qty 2

## 2019-12-11 MED ORDER — OXYCODONE HCL 5 MG/5ML PO SOLN: 5.0000 mg | Freq: Once | ORAL | Status: DC | PRN

## 2019-12-11 MED ORDER — ROCURONIUM BROMIDE 10 MG/ML (PF) SYRINGE
PREFILLED_SYRINGE | INTRAVENOUS | Status: AC
  Filled 2019-12-11: qty 10

## 2019-12-11 MED ORDER — CELECOXIB 200 MG PO CAPS
200.0000 mg | ORAL_CAPSULE | Freq: Two times a day (BID) | ORAL | Status: DC
  Administered 2019-12-11 – 2019-12-13 (×4): 200 mg via ORAL
  Filled 2019-12-11 (×5): qty 1

## 2019-12-11 MED ORDER — KETAMINE HCL 50 MG/ML IJ SOLN
INTRAMUSCULAR | Status: AC
  Filled 2019-12-11: qty 10

## 2019-12-11 SURGICAL SUPPLY — 70 items
"PENCIL ELECTRO HAND CTR " (MISCELLANEOUS) IMPLANT
BLADE SURG 15 STRL LF DISP TIS (BLADE) ×2 IMPLANT
BLADE SURG 15 STRL SS (BLADE) ×1
BLADE SURG SZ11 CARB STEEL (BLADE) ×3 IMPLANT
CANISTER SUCT 1200ML W/VALVE (MISCELLANEOUS) ×3 IMPLANT
CHLORAPREP W/TINT 26 (MISCELLANEOUS) ×3 IMPLANT
COVER TIP SHEARS 8 DVNC (MISCELLANEOUS) ×2 IMPLANT
COVER TIP SHEARS 8MM DA VINCI (MISCELLANEOUS) ×1
COVER WAND RF STERILE (DRAPES) ×6 IMPLANT
DEFOGGER SCOPE WARMER CLEARIFY (MISCELLANEOUS) ×3 IMPLANT
DERMABOND ADVANCED (GAUZE/BANDAGES/DRESSINGS) ×1
DERMABOND ADVANCED .7 DNX12 (GAUZE/BANDAGES/DRESSINGS) ×2 IMPLANT
DEVICE TROCAR PUNCTURE CLOSURE (ENDOMECHANICALS) ×1 IMPLANT
DRAPE ARM DVNC X/XI (DISPOSABLE) ×6 IMPLANT
DRAPE COLUMN DVNC XI (DISPOSABLE) ×2 IMPLANT
DRAPE DA VINCI XI ARM (DISPOSABLE) ×3
DRAPE DA VINCI XI COLUMN (DISPOSABLE) ×1
DRAPE LAPAROTOMY 100X77 ABD (DRAPES) ×3 IMPLANT
ELECT REM PT RETURN 9FT ADLT (ELECTROSURGICAL) ×3
ELECTRODE REM PT RTRN 9FT ADLT (ELECTROSURGICAL) ×2 IMPLANT
GAUZE SPONGE 4X4 12PLY STRL (GAUZE/BANDAGES/DRESSINGS) IMPLANT
GLOVE BIO SURGEON STRL SZ 6.5 (GLOVE) ×6 IMPLANT
GLOVE BIOGEL PI IND STRL 6.5 (GLOVE) ×4 IMPLANT
GLOVE BIOGEL PI INDICATOR 6.5 (GLOVE) ×2
GOWN STRL REUS W/ TWL LRG LVL3 (GOWN DISPOSABLE) ×6 IMPLANT
GOWN STRL REUS W/TWL LRG LVL3 (GOWN DISPOSABLE) ×3
HANDLE YANKAUER SUCT BULB TIP (MISCELLANEOUS) ×1 IMPLANT
IRRIGATOR SUCT 8 DISP DVNC XI (IRRIGATION / IRRIGATOR) IMPLANT
IRRIGATOR SUCTION 8MM XI DISP (IRRIGATION / IRRIGATOR)
IV NS 1000ML (IV SOLUTION)
IV NS 1000ML BAXH (IV SOLUTION) IMPLANT
KIT PINK PAD W/HEAD ARE REST (MISCELLANEOUS) ×3
KIT PINK PAD W/HEAD ARM REST (MISCELLANEOUS) ×2 IMPLANT
KIT PREVENA INCISION MGT20CM45 (CANNISTER) ×1 IMPLANT
KIT TURNOVER KIT A (KITS) ×3 IMPLANT
LABEL OR SOLS (LABEL) ×3 IMPLANT
MESH SOFT 12X12IN BARD (Mesh General) ×1 IMPLANT
NDL HYPO 25X1 1.5 SAFETY (NEEDLE) ×2 IMPLANT
NDL INSUFFLATION 14GA 120MM (NEEDLE) ×2 IMPLANT
NEEDLE HYPO 22GX1.5 SAFETY (NEEDLE) ×3 IMPLANT
NEEDLE HYPO 25X1 1.5 SAFETY (NEEDLE) ×3 IMPLANT
NEEDLE INSUFFLATION 14GA 120MM (NEEDLE) ×3 IMPLANT
NS IRRIG 500ML POUR BTL (IV SOLUTION) ×3 IMPLANT
OBTURATOR OPTICAL STANDARD 8MM (TROCAR) ×1
OBTURATOR OPTICAL STND 8 DVNC (TROCAR) ×2
OBTURATOR OPTICALSTD 8 DVNC (TROCAR) ×2 IMPLANT
PACK BASIN MINOR (MISCELLANEOUS) ×3 IMPLANT
PACK LAP CHOLECYSTECTOMY (MISCELLANEOUS) ×3 IMPLANT
PENCIL ELECTRO HAND CTR (MISCELLANEOUS) ×3 IMPLANT
SEAL CANN UNIV 5-8 DVNC XI (MISCELLANEOUS) ×6 IMPLANT
SEAL XI 5MM-8MM UNIVERSAL (MISCELLANEOUS) ×3
SET TUBE SMOKE EVAC HIGH FLOW (TUBING) ×3 IMPLANT
SOLUTION ELECTROLUBE (MISCELLANEOUS) ×3 IMPLANT
SPONGE KITTNER 5P (MISCELLANEOUS) ×2 IMPLANT
SUT MNCRL 4-0 (SUTURE) ×1
SUT MNCRL 4-0 27XMFL (SUTURE) ×2
SUT MNCRL AB 4-0 PS2 18 (SUTURE) ×3 IMPLANT
SUT PDS AB 2-0 CT1 27 (SUTURE) ×4 IMPLANT
SUT PDS PLUS 0 (SUTURE) ×1
SUT PDS PLUS AB 0 CT-2 (SUTURE) ×2 IMPLANT
SUT PROLENE 0 CT 2 (SUTURE) ×6 IMPLANT
SUT STRATAFIX PDS 30 CT-1 (SUTURE) ×3 IMPLANT
SUT VIC AB 2-0 SH 27 (SUTURE) ×1
SUT VIC AB 2-0 SH 27XBRD (SUTURE) ×2 IMPLANT
SUT VICRYL 3-0 CR8 SH (SUTURE) ×1 IMPLANT
SUT VLOC 90 2/L VL 12 GS22 (SUTURE) ×6 IMPLANT
SUTURE MNCRL 4-0 27XMF (SUTURE) ×2 IMPLANT
SYR 10ML LL (SYRINGE) ×3 IMPLANT
TAPE TRANSPORE STRL 2 31045 (GAUZE/BANDAGES/DRESSINGS) ×3 IMPLANT
TRAY FOLEY MTR SLVR 16FR STAT (SET/KITS/TRAYS/PACK) IMPLANT

## 2019-12-11 NOTE — Interval H&P Note (Signed)
History and Physical Interval Note:  12/11/2019 9:31 AM  Crystal Hammond  has presented today for surgery, with the diagnosis of K43.2 Incisional hernia w/o obstuction or gangrene.  The various methods of treatment have been discussed with the patient and family. After consideration of risks, benefits and other options for treatment, the patient has consented to  Procedure(s): XI ROBOTIC ASSISTED VENTRAL HERNIA Lap vs open (N/A) HERNIA REPAIR VENTRAL ADULT (N/A) as a surgical intervention.  The patient's history has been reviewed, patient examined, no change in status, stable for surgery.  I have reviewed the patient's chart and labs.  Questions were answered to the patient's satisfaction.     Carolan Shiver

## 2019-12-11 NOTE — Anesthesia Postprocedure Evaluation (Signed)
Anesthesia Post Note  Patient: Crystal Hammond  Procedure(s) Performed: XI ROBOTIC ASSISTED VENTRAL HERNIA Lap vs open (N/A Abdomen) HERNIA REPAIR VENTRAL ADULT (N/A )  Patient location during evaluation: PACU Anesthesia Type: General Level of consciousness: awake and alert and oriented Pain management: pain level controlled Vital Signs Assessment: post-procedure vital signs reviewed and stable Respiratory status: spontaneous breathing Cardiovascular status: blood pressure returned to baseline Anesthetic complications: no   No complications documented.   Last Vitals:  Vitals:   12/11/19 1556 12/11/19 1611  BP: (!) 114/56 116/66  Pulse: 84 82  Resp: (!) 22 (!) 21  Temp:    SpO2: 95% 95%    Last Pain:  Vitals:   12/11/19 1611  TempSrc:   PainSc: Asleep                 Naythan Douthit

## 2019-12-11 NOTE — Anesthesia Preprocedure Evaluation (Addendum)
Anesthesia Evaluation  Patient identified by MRN, date of birth, ID band Patient awake    Reviewed: Allergy & Precautions, H&P , NPO status , Patient's Chart, lab work & pertinent test results  History of Anesthesia Complications (+) PONVHistory of anesthetic complications: PONV, has not previously had scopolamine patch to her knowledge. Also reports "low blood pressure" with anesthesia.  Airway Mallampati: II  TM Distance: >3 FB     Dental  (+) Teeth Intact   Pulmonary neg sleep apnea, neg COPD, former smoker,    breath sounds clear to auscultation       Cardiovascular (-) angina(-) Past MI and (-) Cardiac Stents negative cardio ROS  (-) dysrhythmias  Rhythm:regular Rate:Normal     Neuro/Psych  Headaches, PSYCHIATRIC DISORDERS Anxiety Depression OCD   GI/Hepatic negative GI ROS, Neg liver ROS,   Endo/Other  negative endocrine ROS  Renal/GU      Musculoskeletal   Abdominal   Peds  Hematology negative hematology ROS (+)   Anesthesia Other Findings Very nervous patient Obesity  Past Medical History: No date: Anxiety No date: Cervical dysplasia No date: Complication of anesthesia     Comment:  LOW BLOOD PRESSURE No date: Depression No date: Endometriosis 47 (birth): Gastroschisis No date: Heart murmur     Comment:  OUTGREW No date: History of febrile seizure No date: History of kidney stones No date: History of pneumonia No date: Migraines No date: OCD (obsessive compulsive disorder) No date: Oral herpes No date: Pneumonia No date: Postpartum depression No date: Pre-diabetes No date: PTSD (post-traumatic stress disorder) No date: Recurrent UTI  Past Surgical History: 2012: ABDOMINAL SURGERY     Comment:  Endometrial Surgery - went through c-section scar 2019: CESAREAN SECTION 2007: CESAREAN SECTION 2014: CESAREAN SECTION 2016: CESAREAN SECTION No date: GASTROSCHISIS CLOSURE No date: LEEP      Comment:  x 2 No date: WISDOM TOOTH EXTRACTION     Reproductive/Obstetrics negative OB ROS                          Anesthesia Physical Anesthesia Plan  ASA: II  Anesthesia Plan: General ETT   Post-op Pain Management:    Induction:   PONV Risk Score and Plan: Ondansetron, Dexamethasone, Midazolam, Treatment may vary due to age or medical condition and Scopolamine patch - Pre-op  Airway Management Planned:   Additional Equipment:   Intra-op Plan:   Post-operative Plan:   Informed Consent: I have reviewed the patients History and Physical, chart, labs and discussed the procedure including the risks, benefits and alternatives for the proposed anesthesia with the patient or authorized representative who has indicated his/her understanding and acceptance.     Dental Advisory Given  Plan Discussed with: Anesthesiologist, CRNA and Surgeon  Anesthesia Plan Comments:        Anesthesia Quick Evaluation

## 2019-12-11 NOTE — Op Note (Addendum)
Preoperative diagnosis: Ventral (incisional) hernia.  Postoperative diagnosis: Ventral (incisional) hernia, incarcerated.  Procedure: Attempted robotic assisted laparoscopic incisional hernia                      Robotic assisted laparoscopic lysis of adhesions                      Incisional hernia repair                      Bilateral Myocutaneous flaps                      Implantation of mesh                      Negative pressure dressing   Anesthesia: GETA  Surgeon: Dr. Hazle Quant  Assistant Surgeon: Dr. Everlene Farrier  Wound Classification: Clean  Indications:Patient is a 39 y.o. female developed a ventral hernia in the site of a previous incision. This was symptomatic and repair was indicated.   Findings: 1. A 6 (wide)cm x 8 (longitudinal) cm incisional hernia with multiple other small incisional hernias 2. A 30 cm x 15 cm Bard Soft mesh used for repair 3. No hollow viscus organ injury identified during procedure 4. Tension free repair achieved 5. Adequate hemostasis  Description of procedure: The patient was brought to the operating room and general anesthesia was induced. A time-out was completed verifying correct patient, procedure, site, positioning, and implant(s) and/or special equipment prior to beginning this procedure. Antibiotics were administered prior to making the incision. The anterior abdominal wall was prepped and draped in the standard sterile fashion.  An incision was done at Palmer's point.  Veress needle inserted and insufflation was achieved.  Optiview trocar was inserted.  No injuries were identified.  A 12 mm trocar was inserted in the right upper quadrant.  Another 8 mm trocar was extended in the epigastric area.  Robot was docked.  With the use of scissors and forcibly for extensive lysis of lesions was done.  Due to the severe attachment of the small bowel to the left side of the abdomen decision was made to convert surgery to open since there was no enough  space to proceed with robotic hernia repair. A vertical midline incision from suprapubic area to supraumbilical area was made. The old scar was completely excised. The incision was deepened to the fascia. The hernia sac was then identified and dissected free. The peritoneum of the sac was entered and the contents were reduced. The fascia was carefully palpated and additional defects were identified. Intervening fascial bridges were cut to create a single defect.  Adhesions to the underside of the abdominal wall were lysed and the fascia was assessed. Bilateral myocutaneous flaps were created to release tension to be able to approximate the fascia to the midline. The midline fascia was dissected separating the anterior and posterior fascia creating a posterior component separation to further approximate the fascia to the midline. This was done from supraumbilical area to under the pubis bilaterally. The peritoneum and posterior fascia was approximated at midline and closed with a running 2-0 PDS suture. A piece of 30 cm and 15 cm mesh was placed between the posterior fascia and the rectus muscle and sutured circumferentially to the anterior fascia with multiple 2-0 PDS interrumpted sutures. The anterior fascia was then closed with a running PDS 0 suture on midline.  A closed  suction drains were placed on the myocutaneous flaps and retrieved through a different incision. Dead space was irrigated with fibrin sealant subcutaneous tissues were closed with 3-0 Vicryl and the skin was closed with skin staples.  A negative pressure dressing was applied over the wound that measured 12 cm x 2 cm (24 sq cm) in layers. The patient tolerated the procedure well and was brought to the postanesthesia care unit in stable condition.   Specimen: None  Complications: None  Estimated Blood Loss: 50 mL

## 2019-12-11 NOTE — Anesthesia Procedure Notes (Signed)
Procedure Name: Intubation Date/Time: 12/11/2019 9:55 AM Performed by: Babs Sciara, CRNA Pre-anesthesia Checklist: Patient identified, Emergency Drugs available, Suction available, Patient being monitored and Timeout performed Patient Re-evaluated:Patient Re-evaluated prior to induction Oxygen Delivery Method: Circle system utilized Preoxygenation: Pre-oxygenation with 100% oxygen Induction Type: IV induction Ventilation: Mask ventilation without difficulty Laryngoscope Size: Mac and 3 Grade View: Grade I Tube type: Oral Tube size: 7.5 mm Number of attempts: 1 Airway Equipment and Method: Stylet Placement Confirmation: ETT inserted through vocal cords under direct vision,  positive ETCO2 and breath sounds checked- equal and bilateral Secured at: 21 (lip) cm Tube secured with: Tape Dental Injury: Teeth and Oropharynx as per pre-operative assessment

## 2019-12-11 NOTE — Transfer of Care (Signed)
Immediate Anesthesia Transfer of Care Note  Patient: Crystal Hammond  Procedure(s) Performed: XI ROBOTIC ASSISTED VENTRAL HERNIA Lap vs open (N/A Abdomen) HERNIA REPAIR VENTRAL ADULT (N/A )  Patient Location: PACU  Anesthesia Type:General  Level of Consciousness: drowsy  Airway & Oxygen Therapy: Patient Spontanous Breathing and Patient connected to face mask oxygen  Post-op Assessment: Report given to RN and Post -op Vital signs reviewed and stable  Post vital signs: Reviewed and stable  Last Vitals:  Vitals Value Taken Time  BP 96/55 12/11/19 1541  Temp    Pulse 82 12/11/19 1541  Resp 23 12/11/19 1541  SpO2 94 % 12/11/19 1541    Last Pain:  Vitals:   12/11/19 0910  TempSrc: Tympanic  PainSc: 0-No pain         Complications: No complications documented.

## 2019-12-12 DIAGNOSIS — K430 Incisional hernia with obstruction, without gangrene: Secondary | ICD-10-CM | POA: Diagnosis not present

## 2019-12-12 MED ORDER — POLYETHYLENE GLYCOL 3350 17 G PO PACK
17.0000 g | PACK | Freq: Every day | ORAL | Status: DC | PRN
  Administered 2019-12-12 – 2019-12-13 (×2): 17 g via ORAL
  Filled 2019-12-12 (×2): qty 1

## 2019-12-12 MED ORDER — OXYCODONE-ACETAMINOPHEN 7.5-325 MG PO TABS
1.0000 | ORAL_TABLET | ORAL | 0 refills | Status: DC | PRN
Start: 2019-12-12 — End: 2019-12-18

## 2019-12-12 MED ORDER — CELECOXIB 200 MG PO CAPS: 200.0000 mg | ORAL_CAPSULE | Freq: Two times a day (BID) | ORAL | 0 refills | Status: AC

## 2019-12-12 MED ORDER — GABAPENTIN 300 MG PO CAPS: 300.0000 mg | ORAL_CAPSULE | Freq: Three times a day (TID) | ORAL | 0 refills | Status: DC

## 2019-12-12 NOTE — Progress Notes (Signed)
SURGICAL PROGRESS NOTE   Hospital Day(s): 0.   Post op day(s): 1 Day Post-Op.   Interval History: Patient seen and examined this morning, no acute events or new complaints overnight. Patient reports pain controlled with IV medications but has not been able to walk. Later in the day she walk a small amount and needed again IV pain medications. Denies nausea or vomiting.   Vital signs in last 24 hours: [min-max] current  Temp:  [98.1 F (36.7 C)-98.9 F (37.2 C)] 98.6 F (37 C) (08/28 1153) Pulse Rate:  [70-79] 70 (08/28 1153) Resp:  [16-20] 20 (08/28 1153) BP: (93-114)/(54-87) 102/61 (08/28 1153) SpO2:  [97 %-100 %] 99 % (08/28 1153)     Height: 5' (152.4 cm) Weight: 78 kg BMI (Calculated): 33.58   Physical Exam:  Constitutional: alert, cooperative and no distress  Respiratory: breathing non-labored at rest  Cardiovascular: regular rate and sinus rhythm  Gastrointestinal: soft, non-tender, and non-distended. Small bruise on the left lower area of the wound. Prevena in place.   Labs:  CBC Latest Ref Rng & Units 01/23/2019  WBC 4.0 - 10.5 K/uL 9.9  Hemoglobin 12.0 - 15.0 g/dL 23.3  Hematocrit 36 - 46 % 37.3  Platelets 150 - 400 K/uL 306.0   CMP Latest Ref Rng & Units 01/23/2019  Glucose 70 - 99 mg/dL 007(M)  BUN 6 - 23 mg/dL 12  Creatinine 2.26 - 3.33 mg/dL 5.45  Sodium 625 - 638 mEq/L 138  Potassium 3.5 - 5.1 mEq/L 4.3  Chloride 96 - 112 mEq/L 104  CO2 19 - 32 mEq/L 26  Calcium 8.4 - 10.5 mg/dL 9.2  Total Protein 6.0 - 8.3 g/dL 7.0  Total Bilirubin 0.2 - 1.2 mg/dL 0.4  Alkaline Phos 39 - 117 U/L 68  AST 0 - 37 U/L 13  ALT 0 - 35 U/L 15    Imaging studies: No new pertinent imaging studies   Assessment/Plan:  39 y.o. female with incisional hernia 1 Day Post-Op s/p incisional hernia repair.  Still with significant pain needing IV pain medications. Tried to walk through the day but still not able to control pain enough with oral pain medications. Patient will need to  stay another day until pain better controlled with just oral pain medications. Tolerating diet. Diet advanced to regular. Will re evaluate tomorrow morning.   Gae Gallop, MD

## 2019-12-12 NOTE — Discharge Instructions (Signed)
  Diet: Resume home heart healthy regular diet.   Activity: No heavy lifting >20 pounds (children, pets, laundry, garbage) or strenuous activity until follow-up, but light activity and walking are encouraged. Do not drive or drink alcohol if taking narcotic pain medications.  Wound care: Continue Prevena negative pressure dressing in place. Chart drain output one or two times per day.   Medications: Take Gabapentin, Celebrex and Tylenol as instructed. Use Oxycodone as needed for breakthrough pain.   Call office [phone]) at any time if any questions, worsening pain, fevers/chills, bleeding, drainage from incision site, or other concerns.

## 2019-12-13 DIAGNOSIS — K430 Incisional hernia with obstruction, without gangrene: Secondary | ICD-10-CM | POA: Diagnosis not present

## 2019-12-13 NOTE — Discharge Summary (Signed)
  Patient ID: Crystal Hammond MRN: [id number] DOB/AGE: 10/25/1980 39 y.o.  Admit date: 12/11/2019 Discharge date: 12/13/2019   Discharge Diagnoses:  Active Problems:   Incisional hernia   Procedures: Incisional hernia repair with mesh  Hospital Course: Patient with complex incisional hernia.  She underwent incisional hernia repair.  Patient needed to stay in the hospital for pain control.  Today patient with improved pain control.  She is able to ambulate.  She is tolerating diet.  She had a bowel movement.  Negative pressure dressing working properly.  Drain still over 150 cc per 24 hours.  Physical Exam Cardiovascular:     Rate and Rhythm: Normal rate and regular rhythm.     Pulses: Normal pulses.  Pulmonary:     Effort: Pulmonary effort is normal. No respiratory distress.  Abdominal:     General: Abdomen is flat. Bowel sounds are normal. There is no distension.     Palpations: Abdomen is soft.  Neurological:     Mental Status: She is alert and oriented to person, place, and time.      Consults: None  Disposition: Discharge disposition: 01-Home or Self Care       Discharge Instructions    Diet - low sodium heart healthy   Complete by: As directed      Allergies as of 12/13/2019      Reactions   Other Swelling   SHELLFISH   Phenazopyridine Hives, Rash   Benadryl [diphenhydramine]    Pyridium [phenazopyridine Hcl]       Medication List    TAKE these medications   albuterol 108 (90 Base) MCG/ACT inhaler Commonly known as: VENTOLIN HFA Inhale 2 puffs into the lungs every 4 (four) hours as needed for wheezing or shortness of breath (cough, shortness of breath or wheezing.).   albuterol (2.5 MG/3ML) 0.083% nebulizer solution Commonly known as: PROVENTIL Take 3 mLs (2.5 mg total) by nebulization every 6 (six) hours as needed for wheezing or shortness of breath.   amphetamine-dextroamphetamine 25 MG 24 hr capsule Commonly known as: Adderall XR Take 1 capsule  by mouth every morning.   amphetamine-dextroamphetamine 20 MG 24 hr capsule Commonly known as: Adderall XR Take 1 capsule (20 mg total) by mouth 2 (two) times daily with breakfast and lunch.   celecoxib 200 MG capsule Commonly known as: CeleBREX Take 1 capsule (200 mg total) by mouth 2 (two) times daily for 14 days.   FLUoxetine 40 MG capsule Commonly known as: PROZAC Take 1 capsule (40 mg total) by mouth daily after supper.   gabapentin 300 MG capsule Commonly known as: Neurontin Take 1 capsule (300 mg total) by mouth 3 (three) times daily for 14 days.   oxyCODONE-acetaminophen 7.5-325 MG tablet Commonly known as: Percocet Take 1 tablet by mouth every 4 (four) hours as needed for severe pain.       Follow-up Information    Carolan Shiver, MD Follow up in 1 week(s).   Specialty: General Surgery Why: Contact office is drain output is less than 50 mL in 24 hours for removal coordination.  Contact information: [address] [place] [address] [phone]

## 2019-12-13 NOTE — Plan of Care (Signed)
The patient has been discharged. Iv removed. No falls. The patient has been discharged with a prevena wound vac placed by surgeon. The patient was educated on measuring and recording drain output. The patient was educated on changing the dressing around JP drain. Sheet provided for the patient or family to record drain output. The patient is currently stable.  Problem: Education: Goal: Knowledge of General Education information will improve Description: Including pain rating scale, medication(s)/side effects and non-pharmacologic comfort measures Outcome: Completed/Met   Problem: Health Behavior/Discharge Planning: Goal: Ability to manage health-related needs will improve Outcome: Completed/Met   Problem: Clinical Measurements: Goal: Ability to maintain clinical measurements within normal limits will improve Outcome: Completed/Met Goal: Will remain free from infection Outcome: Completed/Met Goal: Diagnostic test results will improve Outcome: Completed/Met Goal: Respiratory complications will improve Outcome: Completed/Met Goal: Cardiovascular complication will be avoided Outcome: Completed/Met   Problem: Activity: Goal: Risk for activity intolerance will decrease Outcome: Completed/Met   Problem: Nutrition: Goal: Adequate nutrition will be maintained Outcome: Completed/Met   Problem: Coping: Goal: Level of anxiety will decrease Outcome: Completed/Met   Problem: Elimination: Goal: Will not experience complications related to bowel motility Outcome: Completed/Met Goal: Will not experience complications related to urinary retention Outcome: Completed/Met   Problem: Pain Managment: Goal: General experience of comfort will improve Outcome: Completed/Met   Problem: Safety: Goal: Ability to remain free from injury will improve Outcome: Completed/Met   Problem: Skin Integrity: Goal: Risk for impaired skin integrity will decrease Outcome: Completed/Met   Problem: Skin  Integrity: Goal: Risk for impaired skin integrity will decrease Outcome: Completed/Met

## 2019-12-14 ENCOUNTER — Encounter: Payer: Self-pay | Admitting: General Surgery

## 2019-12-18 ENCOUNTER — Ambulatory Visit
Admission: RE | Admit: 2019-12-18 | Discharge: 2019-12-18 | Disposition: A | Discharge status: Home / Self Care | Payer: Managed Care, Other (non HMO) | Source: Ambulatory Visit | Attending: General Surgery | Admitting: General Surgery

## 2019-12-18 ENCOUNTER — Other Ambulatory Visit: Payer: Self-pay | Admitting: General Surgery

## 2019-12-18 ENCOUNTER — Other Ambulatory Visit: Payer: Self-pay

## 2019-12-18 DIAGNOSIS — R1084 Generalized abdominal pain: Secondary | ICD-10-CM

## 2019-12-18 MED ORDER — OXYCODONE-ACETAMINOPHEN 7.5-325 MG PO TABS
1.0000 | ORAL_TABLET | ORAL | 0 refills | Status: DC | PRN
Start: 2019-12-18 — End: 2020-01-26

## 2019-12-27 ENCOUNTER — Emergency Department
Admission: EM | Admit: 2019-12-27 | Discharge: 2019-12-27 | Disposition: A | Discharge status: Home / Self Care | Payer: Managed Care, Other (non HMO) | Attending: Emergency Medicine | Admitting: Emergency Medicine

## 2019-12-27 ENCOUNTER — Other Ambulatory Visit: Payer: Self-pay

## 2019-12-27 DIAGNOSIS — T8131XA Disruption of external operation (surgical) wound, not elsewhere classified, initial encounter: Secondary | ICD-10-CM | POA: Insufficient documentation

## 2019-12-27 DIAGNOSIS — Z87891 Personal history of nicotine dependence: Secondary | ICD-10-CM | POA: Insufficient documentation

## 2019-12-27 DIAGNOSIS — Z79899 Other long term (current) drug therapy: Secondary | ICD-10-CM | POA: Insufficient documentation

## 2019-12-27 DIAGNOSIS — T8130XA Disruption of wound, unspecified, initial encounter: Secondary | ICD-10-CM

## 2019-12-27 DIAGNOSIS — Z8616 Personal history of COVID-19: Secondary | ICD-10-CM | POA: Insufficient documentation

## 2019-12-27 LAB — COMPREHENSIVE METABOLIC PANEL
ALT: 16 U/L (ref 0–44)
AST: 16 U/L (ref 15–41)
Albumin: 3.7 g/dL (ref 3.5–5.0)
Alkaline Phosphatase: 53 U/L (ref 38–126)
Anion gap: 8 (ref 5–15)
BUN: 15 mg/dL (ref 6–20)
CO2: 27 mmol/L (ref 22–32)
Calcium: 8.9 mg/dL (ref 8.9–10.3)
Chloride: 104 mmol/L (ref 98–111)
Creatinine, Ser: 0.76 mg/dL (ref 0.44–1.00)
GFR calc Af Amer: >60 mL/min (ref >60)
GFR calc non Af Amer: >60 mL/min (ref >60)
Glucose, Bld: 108 mg/dL — ABNORMAL HIGH (ref 70–99)
Potassium: 3.9 mmol/L (ref 3.5–5.1)
Sodium: 139 mmol/L (ref 135–145)
Total Bilirubin: 0.5 mg/dL (ref 0.3–1.2)
Total Protein: 6.9 g/dL (ref 6.5–8.1)

## 2019-12-27 LAB — PROTIME-INR
INR: 1 (ref 0.8–1.2)
Prothrombin Time: 12.4 seconds (ref 11.4–15.2)

## 2019-12-27 LAB — CBC WITH DIFFERENTIAL/PLATELET
Abs Immature Granulocytes: 0.04 10*3/uL (ref 0.00–0.07)
Basophils Absolute: 0 10*3/uL (ref 0.0–0.1)
Basophils Relative: 0 %
Eosinophils Absolute: 0.2 10*3/uL (ref 0.0–0.5)
Eosinophils Relative: 3 %
HCT: 31.8 % — ABNORMAL LOW (ref 36.0–46.0)
Hemoglobin: 10.5 g/dL — ABNORMAL LOW (ref 12.0–15.0)
Immature Granulocytes: 0 %
Lymphocytes Relative: 28 %
Lymphs Abs: 2.5 10*3/uL (ref 0.7–4.0)
MCH: 27.6 pg (ref 26.0–34.0)
MCHC: 33 g/dL (ref 30.0–36.0)
MCV: 83.5 fL (ref 80.0–100.0)
Monocytes Absolute: 0.6 10*3/uL (ref 0.1–1.0)
Monocytes Relative: 6 %
Neutro Abs: 5.5 10*3/uL (ref 1.7–7.7)
Neutrophils Relative %: 63 %
Platelets: 304 10*3/uL (ref 150–400)
RBC: 3.81 MIL/uL — ABNORMAL LOW (ref 3.87–5.11)
RDW: 12.5 % (ref 11.5–15.5)
WBC: 8.9 10*3/uL (ref 4.0–10.5)
nRBC: 0 % (ref 0.0–0.2)

## 2019-12-27 LAB — APTT: aPTT: 27 seconds (ref 24–36)

## 2019-12-27 NOTE — ED Triage Notes (Signed)
Pt to the er for post op wound de hissed. Pt had a hernia repair 2 weeks ago and stitches were removed on Tuesday and wound de hissed today on Sunday. Wound is 1.5 inches long and 0.5 inch wide.

## 2019-12-27 NOTE — ED Notes (Signed)
First RN Note: pt presents to ED via PTAR, pt states is approx 2 weeks post op from hernia repair, pt states had staples removed several days ago. Pt states was in the shower earlier today, sneezed and felt her wound open up. Pt with noted approx 4in long and very deep dehiscence to her umbilical area, bleeding controlled at this time.

## 2019-12-27 NOTE — ED Provider Notes (Signed)
[facility] Emergency Department Provider Note   ____________________________________________    I have reviewed the triage vital signs and the nursing notes.   HISTORY  Chief Complaint Post-op Problem     HPI Crystal Hammond is a 39 y.o. female who presents with a postoperative problem.  Patient reports she had hernia repair done 2 weeks ago with Dr. Hazle Quant.  She had staples removed recently, she was in the shower today and sneezed and her incision dehisced.  She complains of mild pain in the area, some bleeding now controlled.  No fevers or chills.  Is not take anything for this.  Has not called her surgeon's office.  Reports she has had multiple abdominal surgeries in the past  Past Medical History:  Diagnosis Date  . Anxiety   . Cervical dysplasia   . Complication of anesthesia    LOW BLOOD PRESSURE  . Depression   . Endometriosis   . 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 Heart murmur    OUTGREW  . History of febrile seizure   . History of kidney stones   . History of pneumonia   . Migraines   . OCD (obsessive compulsive disorder)   . Oral herpes   . Pneumonia   . PONV (postoperative nausea and vomiting)   . Postpartum depression   . Pre-diabetes   . PTSD (post-traumatic stress disorder)   . Recurrent UTI     Patient Active Problem List   Diagnosis Date Noted  . Incisional hernia 12/11/2019  . Acute bronchitis with bronchospasm 09/18/2019  . History of COVID-19 07/26/2019  . Prediabetes 07/26/2019  . GAD (generalized anxiety disorder) 06/25/2019  . Adult ADHD 06/25/2019  . Depressive disorder 06/25/2019  . Diet controlled gestational diabetes mellitus (GDM) in third trimester 05/27/2017  . Family history of congenital anomalies 05/27/2017  . Herpes 05/27/2017  . Hx of cesarean section 05/27/2017  . Agoraphobia 05/30/2016  . History of abnormal cervical Pap smear 01/31/2016  . History of kidney stones 01/31/2016    Past  Surgical History:  Procedure Laterality Date  . ABDOMINAL SURGERY  2012   Endometrial Surgery - went through c-section scar  . CESAREAN SECTION  2019  . CESAREAN SECTION  2007  . CESAREAN SECTION  2014  . CESAREAN SECTION  2016  . GASTROSCHISIS CLOSURE    . LEEP     x 2  . VENTRAL HERNIA REPAIR N/A 12/11/2019   Procedure: HERNIA REPAIR VENTRAL ADULT;  Surgeon: Carolan Shiver, MD;  Location: [facility] ORS;  Service: General;  Laterality: N/A;  . WISDOM TOOTH EXTRACTION    . XI ROBOTIC ASSISTED VENTRAL HERNIA N/A 12/11/2019   Procedure: XI ROBOTIC ASSISTED VENTRAL HERNIA Lap vs open;  Surgeon: Carolan Shiver, MD;  Location: [facility] ORS;  Service: General;  Laterality: N/A;    Prior to Admission medications   Medication Sig Start Date End Date Taking? Authorizing Provider  albuterol (PROVENTIL) (2.5 MG/3ML) 0.083% nebulizer solution Take 3 mLs (2.5 mg total) by nebulization every 6 (six) hours as needed for wheezing or shortness of breath. 03/09/19   Emi Belfast, FNP  albuterol (VENTOLIN HFA) 108 (90 Base) MCG/ACT inhaler Inhale 2 puffs into the lungs every 4 (four) hours as needed for wheezing or shortness of breath (cough, shortness of breath or wheezing.). 02/20/19   Emi Belfast, FNP  amphetamine-dextroamphetamine (ADDERALL XR) 20 MG 24 hr capsule Take 1 capsule (20 mg total) by mouth 2 (two) times daily with breakfast  and lunch. 11/21/19 12/21/19  Pucilowski, Roosvelt Maser, MD  amphetamine-dextroamphetamine (ADDERALL XR) 25 MG 24 hr capsule Take 1 capsule by mouth every morning. 07/23/19 08/22/19  Pucilowski, Roosvelt Maser, MD  FLUoxetine (PROZAC) 40 MG capsule Take 1 capsule (40 mg total) by mouth daily after supper. 10/22/19 01/20/20  Pucilowski, Roosvelt Maser, MD  gabapentin (NEURONTIN) 300 MG capsule Take 1 capsule (300 mg total) by mouth 3 (three) times daily for 14 days. 12/12/19 12/26/19  Carolan Shiver, MD  oxyCODONE-acetaminophen (PERCOCET) 7.5-325 MG tablet Take 1 tablet by mouth  every 4 (four) hours as needed for severe pain. 12/18/19   Carolan Shiver, MD     Allergies Other, Phenazopyridine, Benadryl [diphenhydramine], and Pyridium [phenazopyridine hcl]  Family History  Problem Relation Age of Onset  . Depression Mother   . Alcohol abuse Mother   . Depression Father   . Alcohol abuse Father   . Bipolar disorder Brother   . Drug abuse Brother   . ADD / ADHD Brother   . ADD / ADHD Son   . ADD / ADHD Son   . Breast cancer Neg Hx   . Ovarian cancer Neg Hx     Social History Social History   Tobacco Use  . Smoking status: Former Smoker    Packs/day: 0.30    Years: 3.00    Pack years: 0.90    Types: Cigarettes    Quit date: 2013    Years since quitting: 8.7  . Smokeless tobacco: Never Used  Vaping Use  . Vaping Use: Never used  Substance Use Topics  . Alcohol use: Yes    Alcohol/week: 2.0 standard drinks    Types: 2 Cans of beer per week  . Drug use: Never    Review of Systems  Constitutional: No fever/chills Eyes: No visual changes.  ENT: No sore throat. Cardiovascular: Denies chest pain. Respiratory: Denies shortness of breath. Gastrointestinal: As above Genitourinary: Negative for dysuria. Musculoskeletal: Negative for back pain. Skin: Negative for rash. Neurological: Negative for headaches    ____________________________________________   PHYSICAL EXAM:  VITAL SIGNS: ED Triage Vitals  Enc Vitals Group     BP 12/27/19 1814 122/69     Pulse Rate 12/27/19 1814 90     Resp 12/27/19 1814 18     Temp 12/27/19 1814 98.4 F (36.9 C)     Temp Source 12/27/19 1814 Oral     SpO2 12/27/19 1814 97 %     Weight 12/27/19 1753 78 kg (172 lb)     Height 12/27/19 1753 1.524 m (5')     Head Circumference --      Peak Flow --      Pain Score 12/27/19 1752 5     Pain Loc --      Pain Edu? --      Excl. in GC? --     Constitutional: Alert and oriented.  Nose: No congestion/rhinnorhea. Mouth/Throat: Mucous membranes are moist.    Neck:  Painless ROM Cardiovascular: Normal rate, regular rhythm.  Good peripheral circulation. Respiratory: Normal respiratory effort.  No retractions Gastrointestinal: Soft and nontender.  No distention.  In the lower abdomen inferior to the umbilicus, surgical wound is dehisced approximately 4 cm, adipose tissue exposed, fascia appears intact, no bleeding  Musculoskeletal:  Warm and well perfused Neurologic:  Normal speech and language. No gross focal neurologic deficits are appreciated.  Skin:  Skin is warm, dry, see above Psychiatric: Mood and affect are normal. Speech and behavior are normal.  ____________________________________________   LABS (all labs ordered are listed, but only abnormal results are displayed)  Labs Reviewed  CBC WITH DIFFERENTIAL/PLATELET - Abnormal; Notable for the following components:      Result Value   RBC 3.81 (*)    Hemoglobin 10.5 (*)    HCT 31.8 (*)    All other components within normal limits  COMPREHENSIVE METABOLIC PANEL - Abnormal; Notable for the following components:   Glucose, Bld 108 (*)    All other components within normal limits  APTT  PROTIME-INR   ____________________________________________  EKG  None ____________________________________________  RADIOLOGY  None ____________________________________________   PROCEDURES  Procedure(s) performed: No  Procedures   Critical Care performed: No ____________________________________________   INITIAL IMPRESSION / ASSESSMENT AND PLAN / ED COURSE  Pertinent labs & imaging results that were available during my care of the patient were reviewed by me and considered in my medical decision making (see chart for details).  Patient with wound dehiscence as noted above, discussed with Dr. Lady Gary of general surgery who recommends packing the wound with close outpatient follow-up.  She has contacted the patient's surgeon  Patient has normal labs, no signs of infection.   Patient is comfortable with this plan and would prefer not to stay in the hospital overnight.    ____________________________________________   FINAL CLINICAL IMPRESSION(S) / ED DIAGNOSES  Final diagnoses:  Wound dehiscence        Note:  This document was prepared using Dragon voice recognition software and may include unintentional dictation errors.   Jene Every, MD 12/27/19 2129

## 2020-01-12 ENCOUNTER — Other Ambulatory Visit (HOSPITAL_COMMUNITY): Payer: Self-pay | Admitting: Psychiatry

## 2020-01-18 ENCOUNTER — Other Ambulatory Visit: Payer: Medicaid Other

## 2020-01-18 ENCOUNTER — Other Ambulatory Visit: Payer: Self-pay | Admitting: Family Medicine

## 2020-01-18 DIAGNOSIS — E559 Vitamin D deficiency, unspecified: Secondary | ICD-10-CM

## 2020-01-18 DIAGNOSIS — R7303 Prediabetes: Secondary | ICD-10-CM

## 2020-01-18 DIAGNOSIS — R7982 Elevated C-reactive protein (CRP): Secondary | ICD-10-CM

## 2020-01-21 ENCOUNTER — Telehealth (INDEPENDENT_AMBULATORY_CARE_PROVIDER_SITE_OTHER): Payer: 59 | Admitting: Psychiatry

## 2020-01-21 ENCOUNTER — Other Ambulatory Visit: Payer: Self-pay

## 2020-01-21 DIAGNOSIS — F329 Major depressive disorder, single episode, unspecified: Secondary | ICD-10-CM

## 2020-01-21 DIAGNOSIS — F411 Generalized anxiety disorder: Secondary | ICD-10-CM

## 2020-01-21 DIAGNOSIS — F909 Attention-deficit hyperactivity disorder, unspecified type: Secondary | ICD-10-CM

## 2020-01-21 DIAGNOSIS — F32A Depression, unspecified: Secondary | ICD-10-CM

## 2020-01-21 MED ORDER — AMPHETAMINE-DEXTROAMPHET ER 30 MG PO CP24: 30.0000 mg | ORAL_CAPSULE | ORAL | 0 refills | Status: DC

## 2020-01-21 NOTE — Progress Notes (Signed)
BH MD/PA/NP OP Progress Note  01/21/2020 9:45 AM Crystal Hammond  MRN:  [id number] Interview was conducted using videoconferencing application and I verified that I was speaking with the correct person using two identifiers. I discussed the limitations of evaluation and management by telemedicine and  the availability of in person appointments. Patient expressed understanding and agreed to proceed. Patient location - home; physician - home office.  Chief Complaint: Anxious, depressed and unable to focus.  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 She has always struggled with lack of concentration, procrastination, task completion since early elementary school. Her grades were low and she had difficult times on tests in particular. She does not appear to have a significant hyperactivity component. Her self esteem was low as a consequence of these struggles and she always worried about her ability to perform well and satisfy other people expectations of her. Some depression has also been present. She dies having suicidal thoughts, there is no clear indication of bipolar (hypomanic episodes) presence. She was never diagnosed or treated for ADHD as a child although her brother was - he had a clear hyperactivity component and was prescribed Adderall. Her sleep is good, appetite normal. In the past she was treated for anxiety and post-partum depression and had trials of fluoxetine, paroxetine, sertraline and escitalopram. She remembers gaining weight on some of them and did not continue for that reason.We have added fluoxetine for anxiety/depression and while it helps she also feels it makes her sedated. She used to bite nails when anxious and she does not do that anymore. Adderall XR was added for ADD and it  helps to some extent but does not work long enough. She could not tolerate bid dosing due to insomnia. ON August 27 she had a hernia surgery with subsequent wound dehiscence, pain. She just stopped taking oxycodone a week ago. She had to drop classes and was not taking Adderall last month. She is about to start on-line classes next week and would like to resume Adderall. We also increased dose of fluoxetine to 40 mg in PM for anxiety/depression.   Visit Diagnosis:    ICD-10-CM   1. GAD (generalized anxiety disorder)  F41.1   2. Depressive disorder  F32.9   3. Adult ADHD  F90.9     Past Psychiatric History: Please see intake H&P.  Past Medical History:  Past Medical History:  Diagnosis Date  . Anxiety   . Cervical dysplasia   . Complication of anesthesia    LOW BLOOD PRESSURE  . Depression   . Endometriosis   . 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 Heart murmur    OUTGREW  . History of febrile seizure   . History of kidney stones   . History of pneumonia   . Migraines   . OCD (obsessive compulsive disorder)   . Oral herpes   . Pneumonia   . PONV (postoperative nausea and vomiting)   . Postpartum depression   . Pre-diabetes   . PTSD (post-traumatic stress disorder)   . Recurrent UTI     Past Surgical History:  Procedure Laterality Date  . ABDOMINAL SURGERY  2012   Endometrial Surgery - went through c-section scar  . CESAREAN SECTION  2019  . CESAREAN SECTION  2007  . CESAREAN SECTION  2014  .  CESAREAN SECTION  2016  . GASTROSCHISIS CLOSURE    . LEEP     x 2  . VENTRAL HERNIA REPAIR N/A 12/11/2019   Procedure: HERNIA REPAIR VENTRAL ADULT;  Surgeon: Carolan Shiverintron-Diaz, Edgardo, MD;  Location: [facility] ORS;  Service: General;  Laterality: N/A;  . WISDOM TOOTH EXTRACTION    . XI ROBOTIC ASSISTED VENTRAL HERNIA N/A 12/11/2019   Procedure: XI ROBOTIC ASSISTED VENTRAL HERNIA Lap vs open;  Surgeon: Carolan Shiverintron-Diaz, Edgardo, MD;  Location: [facility] ORS;  Service: General;  Laterality: N/A;     Family Psychiatric History: Reviewed.  Family History:  Family History  Problem Relation Age of Onset  . Depression Mother   . Alcohol abuse Mother   . Depression Father   . Alcohol abuse Father   . Bipolar disorder Brother   . Drug abuse Brother   . ADD / ADHD Brother   . ADD / ADHD Son   . ADD / ADHD Son   . Breast cancer Neg Hx   . Ovarian cancer Neg Hx     Social History:  Social History   Socioeconomic History  . Marital status: Married    Spouse name: Not on file  . Number of children: 4  . Years of education: Not on file  . Highest education level: Not on file  Occupational History  . Not on file  Tobacco Use  . Smoking status: Former Smoker    Packs/day: 0.30    Years: 3.00    Pack years: 0.90    Types: Cigarettes    Quit date: 2013    Years since quitting: 8.7  . Smokeless tobacco: Never Used  Vaping Use  . Vaping Use: Never used  Substance and Sexual Activity  . Alcohol use: Yes    Alcohol/week: 2.0 standard drinks    Types: 2 Cans of beer per week  . Drug use: Never  . Sexual activity: Yes    Birth control/protection: None  Other Topics Concern  . 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 Three sons (13, 6, 4) and one daughter (1 yo). Second oldest has autism spectrum disorder and two oldest ADHD.   Social Determinants of Health   Financial Resource Strain:   . Difficulty of Paying Living Expenses: Not on file  Food Insecurity:   . Worried About Programme researcher, broadcasting/film/videounning Out of Food in the Last Year: Not on file  . Ran Out of Food in the Last Year: Not on file  Transportation Needs:   . Lack of Transportation (Medical): Not on file  . Lack of Transportation (Non-Medical): Not on file  Physical Activity:   . Days of Exercise per Week: Not on file  . Minutes of Exercise per Session: Not on file  Stress:   . Feeling of Stress : Not on file  Social Connections:   . Frequency of Communication  with Friends and Family: Not on file  . Frequency of Social Gatherings with Friends and Family: Not on file  . Attends Religious Services: Not on file  . Active Member of Clubs or Organizations: Not on file  . Attends BankerClub or Organization Meetings: Not on file  . Marital Status: Not on file    Allergies:  Allergies  Allergen Reactions  . Other Swelling    SHELLFISH   . Phenazopyridine Hives and Rash  . Benadryl [Diphenhydramine]   . Pyridium [Phenazopyridine Hcl]     Metabolic Disorder  Labs: Lab Results  Component Value Date   HGBA1C 6.2 01/23/2019   No results found for: PROLACTIN No results found for: CHOL, TRIG, HDL, CHOLHDL, VLDL, LDLCALC Lab Results  Component Value Date   TSH 1.69 01/23/2019    Therapeutic Level Labs: No results found for: LITHIUM No results found for: VALPROATE No components found for:  CBMZ  Current Medications: Current Outpatient Medications  Medication Sig Dispense Refill  . albuterol (PROVENTIL) (2.5 MG/3ML) 0.083% nebulizer solution Take 3 mLs (2.5 mg total) by nebulization every 6 (six) hours as needed for wheezing or shortness of breath. 90 mL 1  . albuterol (VENTOLIN HFA) 108 (90 Base) MCG/ACT inhaler Inhale 2 puffs into the lungs every 4 (four) hours as needed for wheezing or shortness of breath (cough, shortness of breath or wheezing.). 18 g 0  . amphetamine-dextroamphetamine (ADDERALL XR) 30 MG 24 hr capsule Take 1 capsule (30 mg total) by mouth every morning. 30 capsule 0  . [START ON 02/20/2020] amphetamine-dextroamphetamine (ADDERALL XR) 30 MG 24 hr capsule Take 1 capsule (30 mg total) by mouth every morning. 30 capsule 0  . [START ON 03/21/2020] amphetamine-dextroamphetamine (ADDERALL XR) 30 MG 24 hr capsule Take 1 capsule (30 mg total) by mouth every morning. 30 capsule 0  . FLUoxetine (PROZAC) 40 MG capsule TAKE 1 CAPSULE (40 MG TOTAL) BY MOUTH DAILY AFTER SUPPER. 90 capsule 0  . gabapentin (NEURONTIN) 300 MG capsule Take 1 capsule  (300 mg total) by mouth 3 (three) times daily for 14 days. 42 capsule 0  . oxyCODONE-acetaminophen (PERCOCET) 7.5-325 MG tablet Take 1 tablet by mouth every 4 (four) hours as needed for severe pain. 20 tablet 0   No current facility-administered medications for this visit.     Psychiatric Specialty Exam: Review of Systems  Gastrointestinal: Positive for abdominal pain.  Psychiatric/Behavioral: Positive for decreased concentration. The patient is nervous/anxious.   All other systems reviewed and are negative.   There were no vitals taken for this visit.There is no height or weight on file to calculate BMI.  General Appearance: Casual and Fairly Groomed  Eye Contact:  Good  Speech:  Clear and Coherent and Normal Rate  Volume:  Normal  Mood:  Anxious and Depressed  Affect:  Constricted  Thought Process:  Goal Directed  Orientation:  Full (Time, Place, and Person)  Thought Content: Logical   Suicidal Thoughts:  No  Homicidal Thoughts:  No  Memory:  Immediate;   Good Recent;   Good Remote;   Good  Judgement:  Good  Insight:  Good  Psychomotor Activity:  Normal  Concentration:  Concentration: Fair  Recall:  Good  Fund of Knowledge: Good  Language: Good  Akathisia:  Negative  Handed:  Right  AIMS (if indicated): not done  Assets:  Communication Skills Desire for Improvement Financial Resources/Insurance Housing Social Support  ADL's:  Intact  Cognition: WNL  Sleep:  Good   Screenings: PHQ2-9     Office Visit from 07/24/2019 in [facility] Visit from 06/17/2019 in [facility] [place] Office Visit from 01/23/2019 in [facility] [place]  PHQ-2 Total Score 0 2 2  PHQ-9 Total Score -- 13 10       Assessment and Plan: 39 yo married female who comes reporting long standing problems with anxiety, concentration, task completion, low self esteem which 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  She has always struggled with lack of concentration, procrastination, task completion since early elementary school. Her grades were low and she had difficult times on tests in particular. She does not appear to have a significant hyperactivity component. Her self esteem was low as a consequence of these struggles and she always worried about her ability to perform well and satisfy other people expectations of her. Some depression has also been present. She dies having suicidal thoughts, there is no clear indication of bipolar (hypomanic episodes) presence. She was never diagnosed or treated for ADHD as a child although her brother was - he had a clear hyperactivity component and was prescribed Adderall. Her sleep is good, appetite normal. In the past she was treated for anxiety and post-partum depression and had trials of fluoxetine, paroxetine, sertraline and escitalopram. She remembers gaining weight on some of them and did not continue for that reason.We have added fluoxetine for anxiety/depression and while it helps she also feels it makes her sedated. She used to bite nails when anxious and she does not do that anymore. Adderall XR was added for ADD and it helps to some extent but does not work long enough. She could not tolerate bid dosing due to insomnia. ON August 27 she had a hernia surgery with subsequent wound dehiscence, pain. She just stopped taking oxycodone a week ago. She had to drop classes and was not taking Adderall last month. She is about to start on-line classes next week and would like to resume Adderall. We also increased dose of fluoxetine to 40 mg in PM for anxiety/depression.  Dx: GAD; Depression unspecified; Adult ADHD  Plan: We will continue fluoxetine 40 mg for anxiety/depressionin the evening hours and restart Adderall XR at a higher 30 mg dose once daily in AM.Next visit in 2 months.The plan was discussed with patient who had an  opportunity to ask questions and these were all answered. I spend68minutes in videoconferencingwith the patient.     Magdalene Patricia, MD 01/21/2020, 9:45 AM

## 2020-01-22 ENCOUNTER — Other Ambulatory Visit (INDEPENDENT_AMBULATORY_CARE_PROVIDER_SITE_OTHER): Payer: Managed Care, Other (non HMO)

## 2020-01-22 ENCOUNTER — Other Ambulatory Visit: Payer: Self-pay

## 2020-01-22 DIAGNOSIS — E559 Vitamin D deficiency, unspecified: Secondary | ICD-10-CM

## 2020-01-22 DIAGNOSIS — R7303 Prediabetes: Secondary | ICD-10-CM | POA: Diagnosis not present

## 2020-01-22 DIAGNOSIS — R7982 Elevated C-reactive protein (CRP): Secondary | ICD-10-CM | POA: Diagnosis not present

## 2020-01-22 LAB — LIPID PANEL
Cholesterol: 122 mg/dL (ref 0–200)
HDL: 39.9 mg/dL (ref >39.00)
LDL Cholesterol: 59 mg/dL (ref 0–99)
NonHDL: 82.18
Total CHOL/HDL Ratio: 3
Triglycerides: 116 mg/dL (ref 0.0–149.0)
VLDL: 23.2 mg/dL (ref 0.0–40.0)

## 2020-01-22 LAB — VITAMIN D 25 HYDROXY (VIT D DEFICIENCY, FRACTURES): VITD: 22.36 ng/mL — ABNORMAL LOW (ref 30.00–100.00)

## 2020-01-22 LAB — HIGH SENSITIVITY CRP: CRP, High Sensitivity: 6.36 mg/L — ABNORMAL HIGH (ref 0.000–5.000)

## 2020-01-22 LAB — HEMOGLOBIN A1C: Hgb A1c MFr Bld: 6 % (ref 4.6–6.5)

## 2020-01-25 ENCOUNTER — Ambulatory Visit (INDEPENDENT_AMBULATORY_CARE_PROVIDER_SITE_OTHER): Payer: Managed Care, Other (non HMO) | Admitting: Family Medicine

## 2020-01-25 ENCOUNTER — Encounter: Payer: Self-pay | Admitting: Family Medicine

## 2020-01-25 ENCOUNTER — Other Ambulatory Visit: Payer: Self-pay

## 2020-01-25 VITALS — BP 102/68 | HR 85 | Temp 97.7°F | Ht 60.0 in | Wt 174.2 lb

## 2020-01-25 DIAGNOSIS — R7303 Prediabetes: Secondary | ICD-10-CM

## 2020-01-25 DIAGNOSIS — Z6831 Body mass index (BMI) 31.0-31.9, adult: Secondary | ICD-10-CM

## 2020-01-25 DIAGNOSIS — T781XXA Other adverse food reactions, not elsewhere classified, initial encounter: Secondary | ICD-10-CM

## 2020-01-25 DIAGNOSIS — Z0000 Encounter for general adult medical examination without abnormal findings: Secondary | ICD-10-CM | POA: Diagnosis not present

## 2020-01-25 DIAGNOSIS — Z23 Encounter for immunization: Secondary | ICD-10-CM | POA: Diagnosis not present

## 2020-01-25 DIAGNOSIS — T61781A Other shellfish poisoning, accidental (unintentional), initial encounter: Secondary | ICD-10-CM

## 2020-01-25 DIAGNOSIS — E559 Vitamin D deficiency, unspecified: Secondary | ICD-10-CM

## 2020-01-25 DIAGNOSIS — L989 Disorder of the skin and subcutaneous tissue, unspecified: Secondary | ICD-10-CM | POA: Diagnosis not present

## 2020-01-25 DIAGNOSIS — E6609 Other obesity due to excess calories: Secondary | ICD-10-CM

## 2020-01-25 NOTE — Patient Instructions (Signed)
Good to see you today  Work on sleep, stress reduction  Follow up in 6 months  I will put in referral to derm/ allergy

## 2020-01-25 NOTE — Progress Notes (Signed)
Subjective:    Patient ID: Crystal Hammond, female    DOB: 30-Dec-1980, 39 y.o.   MRN: [id number]  HPI Chief Complaint  Patient presents with  . Annual Exam    allergy testing  . Weight Loss    concern about unable to get weight down  This is a 39 year old female who presents today for annual exam.  She is accompanied by her preschool-aged daughter.  Pap- 02/03/19 Tdap- 10/11/2017 Flu- annual Covid 19 vaccine- vaccinated Eye-annual Dental-regular Exercise-has been limited since recent abdominal surgery, is able to do some walking Diet-has been very careful about making generally healthy food choices, increasing water, eating protein, vegetables, limiting carbs and shortening eating window.  Hernia repair with wound dehiscence-  still doing wet- dry dressings.   Suspected food allergy-has had some intermittent reaction to shrimp.  Has always been told to avoid IV contrast.   Review of Systems  Constitutional: Positive for fatigue.  HENT: Negative.   Eyes: Negative.   Respiratory: Negative.   Cardiovascular: Negative.   Gastrointestinal: Negative.   Endocrine: Negative.   Genitourinary: Negative.   Musculoskeletal: Negative.   Skin: Positive for wound (abdomen).       Recurrent lesion on nose, flakes and peels.  Psychiatric/Behavioral: Positive for sleep disturbance. Negative for dysphoric mood. The patient is not nervous/anxious.        Objective:   Physical Exam Physical Exam  Constitutional: She is oriented to person, place, and time. She appears well-developed and well-nourished. No distress.  HENT:  Head: Normocephalic and atraumatic.  Right Ear: External ear normal. TM normal.  Left Ear: External ear normal. TM normal.  Nose: Nose normal.  Mouth/Throat: Oropharynx is clear and moist. No oropharyngeal exudate.  Eyes: Conjunctivae are normal.   Neck: Normal range of motion. Neck supple. No JVD present. No thyromegaly present.  Cardiovascular: Normal rate,  regular rhythm, normal heart sounds and intact distal pulses.   Pulmonary/Chest: Effort normal and breath sounds normal. Right breast exhibits no inverted nipple, no mass, no nipple discharge, no skin change and no tenderness. Left breast exhibits no inverted nipple, no mass, no nipple discharge, no skin change and no tenderness. Breasts are symmetrical.  Abdominal: Soft. Bowel sounds are normal. She exhibits no distension and no mass. There is no tenderness. There is no rebound and no guarding.  She has a dry intact abdominal dressing. Musculoskeletal: Normal range of motion. She exhibits no edema or tenderness.  Lymphadenopathy:    She has no cervical adenopathy.  Neurological: She is alert and oriented to person, place, and time.   Skin: Skin is warm and dry. She is not diaphoretic.  On end of the nose there is a scaling papule. Psychiatric: She has a normal mood and affect. Her behavior is normal. Judgment and thought content normal.  Vitals reviewed.     BP 102/68   Pulse 85   Temp 97.7 F (36.5 C) (Temporal)   Ht 5' (1.524 m)   Wt 174 lb 4 oz (79 kg)   LMP 01/18/2020 (Exact Date)   SpO2 98%   BMI 34.03 kg/m  Wt Readings from Last 3 Encounters:  01/25/20 174 lb 4 oz (79 kg)  12/11/19 171 lb 15.3 oz (78 kg)  12/09/19 172 lb (78 kg)       Assessment & Plan:  1. Annual physical exam - Discussed and encouraged healthy lifestyle choices- adequate sleep, regular exercise, stress management and healthy food choices.   2. Lesion of face -  Ambulatory referral to Dermatology  3. 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 Need for influenza vaccination - Flu Vaccine QUAD 36+ mos IM (Fluarix, Fluzone & Afluria Quad PF  5. Prediabetes -Hemoglobin A1c with slight improvement from 6.2-6.0. -Encouraged patient to continue decreasing simple sugars and carbohydrates, increase protein, vegetables, exercise as tolerated -Follow-up in 6 months  6. Vitamin D  deficiency -Continue supplement, improved  7. Class 1 obesity due to excess calories without serious comorbidity with body mass index (BMI) of 31.0 to 31.9 in adult -Currently healing from surgery and with poor sleep and increased stress.  Encouraged her to work on improved sleep, walking as able, stress reduction measures.  Importance of good protein intake for healing -Follow-up in 6 months  This visit occurred during the SARS-CoV-2 public health emergency.  Safety protocols were in place, including screening questions prior to the visit, additional usage of staff PPE, and extensive cleaning of exam room while observing appropriate contact time as indicated for disinfecting solutions.    Olean Ree, FNP-BC  [facility], [facility]  01/26/2020 10:10 AM

## 2020-01-26 ENCOUNTER — Encounter: Payer: Self-pay | Admitting: Family Medicine

## 2020-01-26 DIAGNOSIS — E559 Vitamin D deficiency, unspecified: Secondary | ICD-10-CM | POA: Insufficient documentation

## 2020-02-23 ENCOUNTER — Other Ambulatory Visit (HOSPITAL_COMMUNITY): Payer: Self-pay | Admitting: Psychiatry

## 2020-03-16 ENCOUNTER — Encounter: Payer: Self-pay | Admitting: Family Medicine

## 2020-03-23 ENCOUNTER — Telehealth (HOSPITAL_COMMUNITY): Payer: 59 | Admitting: Psychiatry

## 2020-03-24 ENCOUNTER — Other Ambulatory Visit: Payer: Self-pay

## 2020-03-24 ENCOUNTER — Telehealth (INDEPENDENT_AMBULATORY_CARE_PROVIDER_SITE_OTHER): Payer: 59 | Admitting: Psychiatry

## 2020-03-24 DIAGNOSIS — F411 Generalized anxiety disorder: Secondary | ICD-10-CM

## 2020-03-24 DIAGNOSIS — F909 Attention-deficit hyperactivity disorder, unspecified type: Secondary | ICD-10-CM

## 2020-03-24 DIAGNOSIS — F329 Major depressive disorder, single episode, unspecified: Secondary | ICD-10-CM | POA: Diagnosis not present

## 2020-03-24 DIAGNOSIS — F32A Depression, unspecified: Secondary | ICD-10-CM

## 2020-03-24 MED ORDER — AMPHETAMINE-DEXTROAMPHETAMINE 10 MG PO TABS: 10.0000 mg | ORAL_TABLET | Freq: Every day | ORAL | 0 refills | Status: DC

## 2020-03-24 MED ORDER — FLUOXETINE HCL 20 MG PO CAPS: 60.0000 mg | ORAL_CAPSULE | Freq: Every day | ORAL | 2 refills | Status: DC

## 2020-03-24 MED ORDER — AMPHETAMINE-DEXTROAMPHET ER 30 MG PO CP24: 30.0000 mg | ORAL_CAPSULE | ORAL | 0 refills | Status: DC

## 2020-03-24 NOTE — Progress Notes (Signed)
BH MD/PA/NP OP Progress Note  03/24/2020 4:12 PM Crystal Hammond  MRN:  [id number] Interview was conducted by phone and I verified that I was speaking with the correct person using two identifiers. I discussed the limitations of evaluation and management by telemedicine and  the availability of in person appointments. Patient expressed understanding and agreed to proceed. Participants in the visit: patient (location - home); physician (location - home office).  Chief Complaint: Loss of focus in PM, some depression/anxiety.  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 She has always struggled with lack of concentration, procrastination, task completion since early elementary school. Her grades were low and she had difficult times on tests in particular. She does not appear to have a significant hyperactivity component. Her self esteem was low as a consequence of these struggles and she always worried about her ability to perform well and satisfy other people expectations of her. Some depression has also been present. She dies having suicidal thoughts, there is no clear indication of bipolar (hypomanic episodes) presence. She was never diagnosed or treated for ADHD as a child although her brother was - he had a clear hyperactivity component and was prescribed Adderall. Her sleep is good, appetite normal. In the past she was treated for anxiety and post-partum depression and had trials of fluoxetine, paroxetine, sertraline and escitalopram. She remembers gaining weight on some of them and did not continue for that reason.We have added fluoxetine for anxiety/depression and while it helps she also feels it makes her sedated. She used to bite nails when anxious and she does not do that anymore. Adderall XR was  added for ADD and it helps to some extent but does not work long enough. She could not tolerate bid dosing due to insomnia. We also increased dose of fluoxetine to 40 mg in PM for anxiety/depression - it helped some but she would like to try a higher dose.   Visit Diagnosis:    ICD-10-CM   1. Adult ADHD  F90.9   2. GAD (generalized anxiety disorder)  F41.1   3. Depressive disorder  F32.9     Past Psychiatric History: Please see intake H&P.  Past Medical History:  Past Medical History:  Diagnosis Date  . Anxiety   . Cervical dysplasia   . Complication of anesthesia    LOW BLOOD PRESSURE  . Depression   . Endometriosis   . 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 Heart murmur    OUTGREW  . History of febrile seizure   . History of kidney stones   . History of pneumonia   . Migraines   . OCD (obsessive compulsive disorder)   . Oral herpes   . Pneumonia   . PONV (postoperative nausea and vomiting)   . Postpartum depression   . Pre-diabetes   . PTSD (post-traumatic stress disorder)   . Recurrent UTI     Past Surgical History:  Procedure Laterality Date  . ABDOMINAL SURGERY  2012   Endometrial Surgery - went through c-section scar  . CESAREAN SECTION  2019  . CESAREAN SECTION  2007  . CESAREAN SECTION  2014  . CESAREAN SECTION  2016  . GASTROSCHISIS CLOSURE    . LEEP     x 2  . VENTRAL HERNIA REPAIR N/A 12/11/2019   Procedure: HERNIA  REPAIR VENTRAL ADULT;  Surgeon: Carolan Shiver, MD;  Location: [facility] ORS;  Service: General;  Laterality: N/A;  . WISDOM TOOTH EXTRACTION    . XI ROBOTIC ASSISTED VENTRAL HERNIA N/A 12/11/2019   Procedure: XI ROBOTIC ASSISTED VENTRAL HERNIA Lap vs open;  Surgeon: Carolan Shiver, MD;  Location: [facility] ORS;  Service: General;  Laterality: N/A;    Family Psychiatric History: Reviewed.  Family History:  Family History  Problem Relation Age of Onset  . Depression Mother   . Alcohol abuse Mother   . Depression Father   . Alcohol abuse  Father   . Bipolar disorder Brother   . Drug abuse Brother   . ADD / ADHD Brother   . ADD / ADHD Son   . ADD / ADHD Son   . Breast cancer Neg Hx   . Ovarian cancer Neg Hx     Social History:  Social History   Socioeconomic History  . Marital status: Married    Spouse name: Not on file  . Number of children: 4  . Years of education: Not on file  . Highest education level: Not on file  Occupational History  . Not on file  Tobacco Use  . Smoking status: Former Smoker    Packs/day: 0.30    Years: 3.00    Pack years: 0.90    Types: Cigarettes    Quit date: 2013    Years since quitting: 8.9  . Smokeless tobacco: Never Used  Vaping Use  . Vaping Use: Never used  Substance and Sexual Activity  . Alcohol use: Yes    Alcohol/week: 2.0 standard drinks    Types: 2 Cans of beer per week  . Drug use: Never  . Sexual activity: Yes    Birth control/protection: None  Other Topics Concern  . 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 Three sons (13, 6, 4) and one daughter (1 yo). Second oldest has autism spectrum disorder and two oldest ADHD.   Social Determinants of Health   Financial Resource Strain: Not on file  Food Insecurity: Not on file  Transportation Needs: Not on file  Physical Activity: Not on file  Stress: Not on file  Social Connections: Not on file    Allergies:  Allergies  Allergen Reactions  . Other Swelling    SHELLFISH   . Phenazopyridine Hives and Rash  . Benadryl [Diphenhydramine]   . Pyridium [Phenazopyridine Hcl]     Metabolic Disorder Labs: Lab Results  Component Value Date   HGBA1C 6.0 01/22/2020   No results found for: PROLACTIN Lab Results  Component Value Date   CHOL 122 01/22/2020   TRIG 116.0 01/22/2020   HDL 39.90 01/22/2020   CHOLHDL 3 01/22/2020   VLDL 23.2 01/22/2020   LDLCALC 59 01/22/2020   Lab Results  Component Value Date   TSH 1.69 01/23/2019     Therapeutic Level Labs: No results found for: LITHIUM No results found for: VALPROATE No components found for:  CBMZ  Current Medications: Current Outpatient Medications  Medication Sig Dispense Refill  . amphetamine-dextroamphetamine (ADDERALL XR) 30 MG 24 hr capsule Take 1 capsule (30 mg total) by mouth every morning. 30 capsule 0  . [START ON 05/22/2020] amphetamine-dextroamphetamine (ADDERALL XR) 30 MG 24 hr capsule Take 1 capsule (30 mg total) by mouth every morning. 30 capsule 0  . [START ON 04/21/2020] amphetamine-dextroamphetamine (ADDERALL XR) 30 MG 24 hr capsule Take  1 capsule (30 mg total) by mouth every morning. 30 capsule 0  . amphetamine-dextroamphetamine (ADDERALL) 10 MG tablet Take 1 tablet (10 mg total) by mouth daily at 4 PM. 30 tablet 0  . [START ON 04/23/2020] amphetamine-dextroamphetamine (ADDERALL) 10 MG tablet Take 1 tablet (10 mg total) by mouth daily at 4 PM. 30 tablet 0  . [START ON 05/23/2020] amphetamine-dextroamphetamine (ADDERALL) 10 MG tablet Take 1 tablet (10 mg total) by mouth daily at 4 PM. 30 tablet 0  . FLUoxetine (PROZAC) 20 MG capsule Take 3 capsules (60 mg total) by mouth daily. 90 capsule 2   No current facility-administered medications for this visit.      Psychiatric Specialty Exam: Review of Systems  Psychiatric/Behavioral: Positive for decreased concentration. The patient is nervous/anxious.   All other systems reviewed and are negative.   There were no vitals taken for this visit.There is no height or weight on file to calculate BMI.  General Appearance: NA  Eye Contact:  NA  Speech:  Clear and Coherent and Normal Rate  Volume:  Normal  Mood:  Anxious  Affect:  NA  Thought Process:  Goal Directed and Linear  Orientation:  Full (Time, Place, and Person)  Thought Content: Logical   Suicidal Thoughts:  No  Homicidal Thoughts:  No  Memory:  Immediate;   Good Recent;   Good Remote;   Good  Judgement:  Good  Insight:  Good  Psychomotor  Activity:  NA  Concentration:  Concentration: Good  Recall:  Good  Fund of Knowledge: Good  Language: Good  Akathisia:  Negative  Handed:  Right  AIMS (if indicated): not done  Assets:  Communication Skills Desire for Improvement Financial Resources/Insurance Housing Social Support Talents/Skills  ADL's:  Intact  Cognition: WNL  Sleep:  Good   Screenings: PHQ2-9   Flowsheet Row [facility]  PHQ-2 Total Score 0 2 2  PHQ-9 Total Score -- 13 10       Assessment and Plan: 39 yo married female who comes reporting long standing problems with anxiety, concentration, task completion, low self esteem which affect 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 She has always struggled with lack of concentration, procrastination, task completion since early elementary school. Her grades were low and she had difficult times on tests in particular. She does not appear to have a significant hyperactivity component. Her self esteem was low as a consequence of these struggles and she always worried about her ability to perform well and satisfy other people expectations of her. Some depression has also been present. She dies having suicidal thoughts, there is no clear indication of bipolar (hypomanic episodes) presence. She was never diagnosed or treated for ADHD as a child although her brother was - he had a clear hyperactivity component and was prescribed Adderall. Her sleep is good, appetite normal. In the past she was treated for anxiety and post-partum depression and had trials of fluoxetine, paroxetine, sertraline and escitalopram. She remembers gaining weight on some of them and did not continue for that reason.We have added fluoxetine for anxiety/depression and  while it helps she also feels it makes her sedated. She used to bite nails when anxious and she does not do that anymore. Adderall XR was added for ADD and  it helps to some extent but does not work long enough. She could not tolerate bid dosing due to insomnia. We also increased dose of fluoxetine to 40 mg in PM for anxiety/depression - it helped some but she would like to try a higher dose still.  Dx: GAD; Depression unspecified; Adult ADHD  Plan: We willincrease fluoxetineto 60mg  for anxiety/depressionin theevening hours and add Adderall IR 10 mg in PM to Adderall XR 30 mg daily in AM.Next visit in 3 months.The plan was discussed with patient who had an opportunity to ask questions and these were all answered. I spend15 minutes in phone consultation with the patient.   , MD 03/24/2020, 4:12 PM

## 2020-04-20 ENCOUNTER — Other Ambulatory Visit (HOSPITAL_COMMUNITY): Payer: Self-pay | Admitting: Psychiatry

## 2020-05-25 ENCOUNTER — Telehealth: Payer: 59 | Admitting: Family Medicine

## 2020-05-25 ENCOUNTER — Encounter: Payer: Self-pay | Admitting: Family Medicine

## 2020-05-25 DIAGNOSIS — R0981 Nasal congestion: Secondary | ICD-10-CM | POA: Insufficient documentation

## 2020-05-25 DIAGNOSIS — J0140 Acute pansinusitis, unspecified: Secondary | ICD-10-CM | POA: Diagnosis not present

## 2020-05-25 DIAGNOSIS — J208 Acute bronchitis due to other specified organisms: Secondary | ICD-10-CM | POA: Diagnosis not present

## 2020-05-25 MED ORDER — AZITHROMYCIN 250 MG PO TABS: ORAL_TABLET | ORAL | 0 refills | Status: DC

## 2020-05-25 MED ORDER — PREDNISONE 10 MG (21) PO TBPK: ORAL_TABLET | ORAL | 0 refills | Status: DC

## 2020-05-25 MED ORDER — BENZONATATE 100 MG PO CAPS: 100.0000 mg | ORAL_CAPSULE | Freq: Three times a day (TID) | ORAL | 0 refills | Status: DC | PRN

## 2020-05-25 NOTE — Progress Notes (Signed)
[name], Hammond are scheduled for a virtual visit with your provider today.    Just as we do with appointments in the office, we must obtain your consent to participate.  Your consent will be active for this visit and any virtual visit you may have with one of our providers in the next 365 days.    If you have a MyChart account, I can also send a copy of this consent to you electronically.  All virtual visits are billed to your insurance company just like a traditional visit in the office.  As this is a virtual visit, video technology does not allow for your provider to perform a traditional examination.  This may limit your provider's ability to fully assess your condition.  If your provider identifies any concerns that need to be evaluated in person or the need to arrange testing such as labs, EKG, etc, we will make arrangements to do so.    Although advances in technology are sophisticated, we cannot ensure that it will always work on either your end or our end.  If the connection with a video visit is poor, we may have to switch to a telephone visit.  With either a video or telephone visit, we are not always able to ensure that we have a secure connection.   I need to obtain your verbal consent now.   Are you willing to proceed with your visit today?   Crystal Hammond has provided verbal consent on 05/25/2020 for a virtual visit (video or telephone).   Crystal Finner, NP 05/25/2020  8:59 AM   Date:  05/25/2020   ID:  [name], DOB 1981/02/13, MRN [id number]  Patient Location: Home Provider Location: Home Office   Participants: Patient and Provider for Visit and Wrap up  Method of visit: Video  Location of Patient: Home Location of Provider: Home Office Consent was obtain for visit over the video. Services rendered by provider: Visit was performed via video  A video enabled telemedicine application was used and I verified that I am speaking with the correct person using two  identifiers.  PCP:  Emi Belfast, FNP (Inactive)   Chief Complaint:  Cough and sinus symptoms   History of Present Illness:    Crystal Hammond is a 40 y.o. female with history as stated below. Presents video telehealth for an acute care visit for on going dry cough and sinus symptoms. Reports today that she has completed her COVID vaccines and booster Works at [employer]- no recent + patients, but exposure is still there.  She has history of bronchitis and PNA. No history of asthma, former smoker.  She reports loss of voice, dry unproductive cough, negative PCR with health at work, negative home rapid text. No on else in the home is currently sick. She reports head congestion, nasal stuffiness and drainage. Increased fatigue. Had some vomiting after work on Sunday- but felt it was due to cough so hard. She has no fever or chills. Denies ear or throat, chest pain, shortness of breath.  No recent use of antibiotics.   Past Medical, Surgical, Social History, Allergies, and Medications have been Reviewed.  Past Medical History:  Diagnosis Date  . Anxiety   . Cervical dysplasia   . Complication of anesthesia    LOW BLOOD PRESSURE  . Depression   . Endometriosis   . 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 Heart murmur    OUTGREW  . History of febrile seizure   .  History of kidney stones   . History of pneumonia   . Migraines   . OCD (obsessive compulsive disorder)   . Oral herpes   . Pneumonia   . PONV (postoperative nausea and vomiting)   . Postpartum depression   . Pre-diabetes   . PTSD (post-traumatic stress disorder)   . Recurrent UTI     No outpatient medications have been marked as taking for the 05/25/20 encounter (Video Visit) with Crystal Finner, NP.     Allergies:   Other, Phenazopyridine, Benadryl [diphenhydramine], and Pyridium [phenazopyridine hcl]   ROS See HPI for history of present illness.  Physical Exam Constitutional:      Appearance: Normal appearance.   HENT:     Head: Normocephalic and atraumatic.     Comments: Tenderness across face- pt palpitated     Right Ear: External ear normal.     Left Ear: External ear normal.     Nose: Nose normal.  Eyes:     Extraocular Movements: Extraocular movements intact.     Conjunctiva/sclera: Conjunctivae normal.     Pupils: Pupils are equal, round, and reactive to light.  Pulmonary:     Comments: Hoarseness, cough- noted in conversation   No shortness of breath was noted Musculoskeletal:        General: Normal range of motion.     Cervical back: Normal range of motion.  Skin:    Coloration: Skin is pale. Skin is not jaundiced.  Neurological:     Mental Status: She is alert and oriented to person, place, and time.  Psychiatric:        Mood and Affect: Mood normal.        Behavior: Behavior normal.        Thought Content: Thought content normal.        Judgment: Judgment normal.               1. Acute bronchitis, viral -Covid PCR and Rapid neg S&S are consistent with bronchitis most likely viral related. -prednisone dose pk -tessalon perles Encouraged OTC treatment measures as needed -Hydration and rest -No work note needed at this time -Precautions on when to call PCP or go to ED reviewed -Pt verbalized understanding  2. Acute non-recurrent pansinusitis -Covid PCR and Rapid neg Given head pressure and on going symptoms will treat for start of sinus infection as well -z pak provided -OTC treatment measures as needed for other symptoms (flonase, nasal spray) -Pt verbalized understanding      Time:   Today, I have spent 15 minutes with the patient with telehealth technology discussing the above problems, reviewing the chart, previous notes, medications and orders.    Tests Ordered: No orders of the defined types were placed in this encounter.   Medication Changes: No orders of the defined types were placed in this encounter.    Disposition:  Follow up w PCP if not  improved or ED if worsen Signed, Crystal Finner, NP  05/25/2020 8:59 AM

## 2020-05-25 NOTE — Patient Instructions (Signed)
Today you were seen for URI/Sinus Infection. As well as Bronchitis  Please take the prescribed medication as directed and complete the full dose to prevent reinfection.  Please eat yogurt or take a probiotic if possible to avoid a yeast infection (this is common) from antibiotic. Should you develop a infection call the office or send a message in MyChart to let us know. We will address it quickly.  As suggested for symptom management use Nasal saline spray to help ease congestion and dry nasal passages. Can be used throughout the day as needed. Avoid forceful blowing of nose. It will be common to have some blood if blowing nose a lot or if nose is very dry. Can you a humidifier at home as well. Remember to wash and air it out regularly. Tylenol (325 mg 2 tablets every 6 hours) and or Ibuprofen (200- 400 mg every 8 hours) for fever, sore throat and body aches. Can consider use of a Neti-pot to wash out sinus cavity (twice daily). Please be aware that you need to use distilled or boiled water for these. If congestion is increasing and or coughing can use Mucinex (twice daily) for cough and congestion with full glass of water.  If you have a sore throat warm fluids like hot tea or lemon water can help sooth this.    Please hydrate, rest, get fresh air daily and wash your hands well.   Acute Bronchitis, Adult  Acute bronchitis is sudden or acute swelling of the air tubes (bronchi) in the lungs. Acute bronchitis causes these tubes to fill with mucus, which can make it hard to breathe. It can also cause coughing or wheezing. In adults, acute bronchitis usually goes away within 2 weeks. A cough caused by bronchitis may last up to 3 weeks. Smoking, allergies, and asthma can make the condition worse. What are the causes? This condition can be caused by germs and by substances that irritate the lungs, including:  Cold and flu viruses. The most common cause of this condition is the virus that causes  the common cold.  Bacteria.  Substances that irritate the lungs, including: ? Smoke from cigarettes and other forms of tobacco. ? Dust and pollen. ? Fumes from chemical products, gases, or burned fuel. ? Other materials that pollute indoor or outdoor air.  Close contact with someone who has acute bronchitis. What increases the risk? The following factors may make you more likely to develop this condition:  A weak body's defense system, also called the immune system.  A condition that affects your lungs and breathing, such as asthma. What are the signs or symptoms? Common symptoms of this condition include:  Lung and breathing problems, such as: ? Coughing. This may bring up clear, yellow, or green mucus from your lungs (sputum). ? Wheezing. ? Having too much mucus in your lungs (chest congestion). ? Having shortness of breath.  A fever.  Chills.  Aches and pains, including: ? Tightness in your chest and other body aches. ? A sore throat. How is this diagnosed? This condition is usually diagnosed based on:  Your symptoms and medical history.  A physical exam. You may also have other tests, including tests to rule out other conditions, such pneumonia. These tests include:  A test of lung function.  Test of a mucus sample to look for the presence of bacteria.  Tests to check the oxygen level in your blood.  Blood tests.  Chest X-ray. How is this treated? Most cases of acute  bronchitis clear up over time without treatment. Your health care provider may recommend:  Drinking more fluids. This can thin your mucus, which may improve your breathing.  Taking a medicine for a fever or cough.  Using a device that gets medicine into your lungs (inhaler) to help improve breathing and control coughing.  Using a vaporizer or a humidifier. These are machines that add water to the air to help you breathe better. Follow these instructions at home: Activity  Get plenty of  rest.  Return to your normal activities as told by your health care provider. Ask your health care provider what activities are safe for you. Lifestyle  Drink enough fluid to keep your urine pale yellow.  Do not drink alcohol.  Do not use any products that contain nicotine or tobacco, such as cigarettes, e-cigarettes, and chewing tobacco. If you need help quitting, ask your health care provider. Be aware that: ? Your bronchitis will get worse if you smoke or breathe in other people's smoke (secondhand smoke). ? Your lungs will heal faster if you quit smoking. General instructions  Take over-the-counter and prescription medicines only as told by your health care provider.  Use an inhaler, vaporizer, or humidifier as told by your health care provider.  If you have a sore throat, gargle with a salt-water mixture 3-4 times a day or as needed. To make a salt-water mixture, completely dissolve -1 tsp (3-6 g) of salt in 1 cup (237 mL) of warm water.  Keep all follow-up visits as told by your health care provider. This is important.   How is this prevented? To lower your risk of getting this condition again:  Wash your hands often with soap and water. If soap and water are not available, use hand sanitizer.  Avoid contact with people who have cold symptoms.  Try not to touch your mouth, nose, or eyes with your hands.  Avoid places where there are fumes from chemicals. Breathing these fumes will make your condition worse.  Get the flu shot every year.   Contact a health care provider if:  Your symptoms do not improve after 2 weeks of treatment.  You vomit more than once or twice.  You have symptoms of dehydration such as: ? Dark urine. ? Dry skin or eyes. ? Increased thirst. ? Headaches. ? Confusion. ? Muscle cramps. Get help right away if you:  Cough up blood.  Feel pain in your chest.  Have severe shortness of breath.  Faint or keep feeling like you are going to  faint.  Have a severe headache.  Have fever or chills that get worse. These symptoms may represent a serious problem that is an emergency. Do not wait to see if the symptoms will go away. Get medical help right away. Call your local emergency services (911 in the [place] Do not drive yourself to the hospital. Summary  Acute bronchitis is sudden (acute) inflammation of the air tubes (bronchi) between the windpipe and the lungs. In adults, acute bronchitis usually goes away within 2 weeks, although coughing may last 3 weeks or longer  Take over-the-counter and prescription medicines only as told by your health care provider.  Drink enough fluid to keep your urine pale yellow.  Contact a health care provider if your symptoms do not improve after 2 weeks of treatment.  Get help right away if you cough up blood, faint, or have chest pain or shortness of breath. This information is not intended to replace advice given to  you by your health care provider. Make sure you discuss any questions you have with your health care provider. Document Revised: 12/15/2018 Document Reviewed: 10/24/2018 Elsevier Patient Education  2021 [employer].

## 2020-05-26 ENCOUNTER — Other Ambulatory Visit: Payer: Self-pay

## 2020-05-26 ENCOUNTER — Telehealth: Payer: 59 | Admitting: Internal Medicine

## 2020-06-15 ENCOUNTER — Encounter: Payer: Self-pay | Admitting: Dermatology

## 2020-06-15 ENCOUNTER — Other Ambulatory Visit (HOSPITAL_COMMUNITY): Payer: Self-pay | Admitting: Dermatology

## 2020-06-15 ENCOUNTER — Ambulatory Visit: Payer: 59 | Admitting: Dermatology

## 2020-06-15 ENCOUNTER — Other Ambulatory Visit: Payer: Self-pay

## 2020-06-15 DIAGNOSIS — L409 Psoriasis, unspecified: Secondary | ICD-10-CM | POA: Diagnosis not present

## 2020-06-15 DIAGNOSIS — L719 Rosacea, unspecified: Secondary | ICD-10-CM

## 2020-06-15 MED ORDER — MOMETASONE FUROATE 0.1 % EX SOLN: CUTANEOUS | 2 refills | Status: DC

## 2020-06-15 NOTE — Patient Instructions (Addendum)
Rosacea  What is rosacea? Rosacea (say: ro-zay-sha) is a common skin disease that usually begins as a trend of flushing or blushing easily.  As rosacea progresses, a persistent redness in the center of the face will develop and may gradually spread beyond the nose and cheeks to the forehead and chin.  In some cases, the ears, chest, and back could be affected.  Rosacea may appear as tiny blood vessels or small red bumps that occur in crops.  Frequently they can contain pus, and are called "pustules".  If the bumps do not contain pus, they are referred to as "papules".  Rarely, in prolonged, untreated cases of rosacea, the oil glands of the nose and cheeks may become permanently enlarged.  This is called rhinophyma, and is seen more frequently in men.  Signs and Risks In its beginning stages, rosacea tends to come and go, which makes it difficult to recognize.  It can start as intermittent flushing of the face.  Eventually, blood vessels may become permanently visible.  Pustules and papules can appear, but can be mistaken for adult acne.  People of all races, ages, genders and ethnic groups are at risk of developing rosacea.  However, it is more common in women (especially around menopause) and adults with fair skin between the ages of 30 and 50.  Treatment Dermatologists typically recommend a combination of treatments to effectively manage rosacea.  Treatment can improve symptoms and may stop the progression of the rosacea.  Treatment may involve both topical and oral medications.  The tetracycline antibiotics are often used for their anti-inflammatory effect; however, because of the possibility of developing antibiotic resistance, they should not be used long term at full dose.  For dilated blood vessels the options include electrodessication (uses electric current through a small needle), laser treatment, and cosmetics to hide the redness.   With all forms of treatment, improvement is a slow process, and  patients may not see any results for the first 3-4 weeks.  It is very important to avoid the sun and other triggers.  Patients must wear sunscreen daily.  Skin Care Instructions: 1. Cleanse the skin with a mild soap such as CeraVe cleanser, Cetaphil cleanser, or Dove soap once or twice daily as needed. 2. Moisturize with Eucerin Redness Relief Daily Perfecting Lotion (has a subtle green tint), CeraVe Moisturizing Cream, or Oil of Olay Daily Moisturizer with sunscreen every morning and/or night as recommended. 3. Makeup should be "non-comedogenic" (won't clog pores) and be labeled "for sensitive skin". Good choices for cosmetics are: Neutrogena, Almay, and Physician's Formula.  Any product with a green tint tends to offset a red complexion. 4. If your eyes are dry and irritated, use artificial tears 2-3 times per day and cleanse the eyelids daily with baby shampoo.  Have your eyes examined at least every 2 years.  Be sure to tell your eye doctor that you have rosacea. 5. Alcoholic beverages tend to cause flushing of the skin, and may make rosacea worse. 6. Always wear sunscreen, protect your skin from extreme hot and cold temperatures, and avoid spicy foods, hot drinks, and mechanical irritation such as rubbing, scrubbing, or massaging the face.  Avoid harsh skin cleansers, cleansing masks, astringents, and exfoliation. If a particular product burns or makes your face feel tight, then it is likely to flare your rosacea. 7. If you are having difficulty finding a sunscreen that you can tolerate, you may try switching to a chemical-free sunscreen.  These are ones whose active   ingredient is zinc oxide or titanium dioxide only.  They should also be fragrance free, non-comedogenic, and labeled for sensitive skin. 8. Rosacea triggers may vary from person to person.  There are a variety of foods that have been reported to trigger rosacea.  Some patients find that keeping a diary of what they were doing when they  flared helps them avoid triggers.    p-Phenylenediamine avoid in hair dye   Instructions for [facility] Medications  One or more of your medications was sent to the [facility] mail order compounding pharmacy. You will receive an email from them and can purchase the medicine through that link. It will then be mailed to your home at the address you confirmed. If for any reason you do not receive an email from them, please check your spam folder. If you still do not find the email, please let us know. [facility] phone number is [phone].

## 2020-06-15 NOTE — Progress Notes (Signed)
   New Patient Visit  Subjective  Crystal Hammond is a 40 y.o. female who presents for the following: check spots nose  (Nose, ~ 4yr, no symptoms, dry spots flake off) and Psoriasis (Scalp, ~20 yrs, no txt at this time).  New patient referral from Olean Ree, FNP.  The following portions of the chart were reviewed this encounter and updated as appropriate:   Tobacco  Allergies  Meds  Problems  Med Hx  Surg Hx  Fam Hx     Review of Systems:  No other skin or systemic complaints except as noted in HPI or Assessment and Plan.  Objective  Well appearing patient in no apparent distress; mood and affect are within normal limits.  A focused examination was performed including face, scalp. Relevant physical exam findings are noted in the Assessment and Plan.  Objective  face: Erythema cheeks, telangiectasias face  Objective  Scalp: Scalp clear today   Assessment & Plan  Rosacea face Rosacea is a chronic progressive skin condition usually affecting the face of adults, causing redness and/or acne bumps. It is treatable but not curable. It sometimes affects the eyes (ocular rosacea) as well. It may respond to topical and/or systemic medication and can flare with stress, sun exposure, alcohol, exercise and some foods.  Daily application of broad spectrum spf 30+ sunscreen to face is recommended to reduce flares.   Dicussed BBL LASER treatments.  Will prescribe [facility] metronidazole/ivermectin/azelaic acid twice daily as needed to affected areas on the face. The patient was advised this is not covered by insurance since it is made by a compounding pharmacy. They will receive an email to check out and the medication will be mailed to their home.    Psoriasis Scalp Psoriasis is a chronic non-curable, but treatable genetic/hereditary disease that may have other systemic features affecting other organ systems such as joints (Psoriatic Arthritis). It is associated with an  increased risk of inflammatory bowel disease, heart disease, non-alcoholic fatty liver disease, and depression.    Start Mometasone sol qd x 2wks then decrease to 2d/wk until clear, then prn flares  Recommend Vegetable dye for hair color, avoid p-Phenylenediamine due to possible allergy to PPD.  Her psoriasis flares every time she got hair dyed.  mometasone (ELOCON) 0.1 % lotion - Scalp  Return in about 3 months (around 09/15/2020) for f/u Rosacea, psoriasis.   I, Ardis Rowan, RMA, am acting as scribe for Armida Sans, MD .  Documentation: I have reviewed the above documentation for accuracy and completeness, and I agree with the above.  Armida Sans, MD

## 2020-06-23 ENCOUNTER — Telehealth (INDEPENDENT_AMBULATORY_CARE_PROVIDER_SITE_OTHER): Payer: 59 | Admitting: Psychiatry

## 2020-06-23 ENCOUNTER — Other Ambulatory Visit: Payer: Self-pay

## 2020-06-23 DIAGNOSIS — F909 Attention-deficit hyperactivity disorder, unspecified type: Secondary | ICD-10-CM

## 2020-06-23 DIAGNOSIS — F329 Major depressive disorder, single episode, unspecified: Secondary | ICD-10-CM

## 2020-06-23 DIAGNOSIS — F32A Depression, unspecified: Secondary | ICD-10-CM

## 2020-06-23 DIAGNOSIS — F411 Generalized anxiety disorder: Secondary | ICD-10-CM

## 2020-06-23 MED ORDER — AMPHETAMINE-DEXTROAMPHET ER 30 MG PO CP24: 30.0000 mg | ORAL_CAPSULE | ORAL | 0 refills | Status: DC

## 2020-06-23 MED ORDER — AMPHETAMINE-DEXTROAMPHETAMINE 10 MG PO TABS: 10.0000 mg | ORAL_TABLET | Freq: Every day | ORAL | 0 refills | Status: DC

## 2020-06-23 MED ORDER — VORTIOXETINE HBR 10 MG PO TABS: 10.0000 mg | ORAL_TABLET | Freq: Every day | ORAL | 1 refills | Status: DC

## 2020-06-23 NOTE — Progress Notes (Signed)
BH MD/PA/NP OP Progress Note  06/23/2020 9:46 AM Crystal Hammond  MRN:  [id number] Interview was conducted by phone and I verified that I was speaking with the correct person using two identifiers. I discussed the limitations of evaluation and management by telemedicine and  the availability of in person appointments. Patient expressed understanding and agreed to proceed. Participants in the visit: patient (location - home); physician (location - home office).  Chief Complaint: Fatigue in AM.  HPI: 40 yo married female who comes reporting long standing problems with anxiety, concentration, task completion, low self esteem.  She has always struggled with lack of concentration, procrastination, task completion since early elementary school. Her grades were low and she had difficult times on tests in particular. She does not appear to have a significant hyperactivity component. Her self esteem was low as a consequence of these struggles and she always worried about her ability to perform well and satisfy other people expectations of her. Some depression has also been present. She never had suicidal thoughts; there is no clear indication of bipolar (hypomanic episodes) presence. She was never diagnosed or treated for ADHD as a child although her brother was - he had a clear hyperactivity component and was prescribed Adderall. Her sleep is good, appetite normal. In the past she was treated for anxiety and post-partum depression and had trials of fluoxetine, paroxetine, sertraline and escitalopram. She remembers gaining weight on some of them and did not continue for that reason.We have added fluoxetine for anxiety/depression and while it helps she also feels it makes her sedated ("hung-over"). She used to bite nails when anxious and she does not do that anymore. Adderall XR was added for ADD and it helps to some extent but does not work long enough.She could not tolerate bid dosing due to insomnia. We then  added IR 10 mg in PM which she seldom uses. She now works at [employer].   Visit Diagnosis:    ICD-10-CM   1. Adult ADHD  F90.9   2. Depressive disorder  F32.9   3. GAD (generalized anxiety disorder)  F41.1     Past Psychiatric History: Please see intake H&P.  Past Medical History:  Past Medical History:  Diagnosis Date  . Anxiety   . Cervical dysplasia   . Complication of anesthesia    LOW BLOOD PRESSURE  . Depression   . Endometriosis   . 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 Heart murmur    OUTGREW  . History of febrile seizure   . History of kidney stones   . History of pneumonia   . Migraines   . OCD (obsessive compulsive disorder)   . Oral herpes   . Pneumonia   . PONV (postoperative nausea and vomiting)   . Postpartum depression   . Pre-diabetes   . PTSD (post-traumatic stress disorder)   . Recurrent UTI     Past Surgical History:  Procedure Laterality Date  . ABDOMINAL SURGERY  2012   Endometrial Surgery - went through c-section scar  . CESAREAN SECTION  2019  . CESAREAN SECTION  2007  . CESAREAN SECTION  2014  . CESAREAN SECTION  2016  . GASTROSCHISIS CLOSURE    . LEEP     x 2  . VENTRAL HERNIA REPAIR N/A 12/11/2019   Procedure: HERNIA REPAIR VENTRAL ADULT;  Surgeon: Carolan Shiver, MD;  Location: [facility] ORS;  Service: General;  Laterality: N/A;  . WISDOM TOOTH EXTRACTION    . XI ROBOTIC ASSISTED VENTRAL HERNIA N/A  12/11/2019   Procedure: XI ROBOTIC ASSISTED VENTRAL HERNIA Lap vs open;  Surgeon: Carolan Shiverintron-Diaz, Edgardo, MD;  Location: [facility];  Service: General;  Laterality: N/A;    Family Psychiatric History: Reviewed.  Family History:  Family History  Problem Relation Age of Onset  . Depression Mother   . Alcohol abuse Mother   . Depression Father   . Alcohol abuse Father   . Bipolar disorder Brother   . Drug abuse Brother   . ADD / ADHD Brother   . ADD / ADHD Son   . ADD / ADHD Son   . Breast cancer Neg Hx   . Ovarian cancer Neg Hx     Social  History:  Social History   Socioeconomic History  . Marital status: Married    Spouse name: Not on file  . Number of children: 4  . Years of education: Not on file  . Highest education level: Not on file  Occupational History  . Not on file  Tobacco Use  . Smoking status: Former Smoker    Packs/day: 0.30    Years: 3.00    Pack years: 0.90    Types: Cigarettes    Quit date: 2013    Years since quitting: 9.1  . Smokeless tobacco: Never Used  Vaping Use  . Vaping Use: Never used  Substance and Sexual Activity  . Alcohol use: Yes    Alcohol/week: 2.0 standard drinks    Types: 2 Cans of beer per week  . Drug use: Never  . Sexual activity: Yes    Birth control/protection: None  Other Topics Concern  . Not on file  Social History Narrative   Taking C[employer] course at [employer], plans to apply to a nursing school. Three sons (13, 6, 4) and one daughter (1 yo). Second oldest has autism spectrum disorder and two oldest ADHD.   Social Determinants of Health   Financial Resource Strain: Not on file  Food Insecurity: Not on file  Transportation Needs: Not on file  Physical Activity: Not on file  Stress: Not on file  Social Connections: Not on file    Allergies:  Allergies  Allergen Reactions  . Other Swelling    SHELLFISH   . Phenazopyridine Hives and Rash  . Benadryl [Diphenhydramine]   . Pyridium [Phenazopyridine Hcl]     Metabolic Disorder Labs: Lab Results  Component Value Date   HGBA1C 6.0 01/22/2020   No results found for: PROLACTIN Lab Results  Component Value Date   CHOL 122 01/22/2020   TRIG 116.0 01/22/2020   HDL 39.90 01/22/2020   CHOLHDL 3 01/22/2020   VLDL 23.2 01/22/2020   LDLCALC 59 01/22/2020   Lab Results  Component Value Date   TSH 1.69 01/23/2019    Therapeutic Level Labs: No results found for: LITHIUM No results found for: VALPROATE No components found for:  CBMZ  Current Medications: Current Outpatient Medications  Medication Sig  Dispense Refill  . vortioxetine HBr (TRINTELLIX) 10 MG TABS tablet Take 1 tablet (10 mg total) by mouth daily. 30 tablet 1  . [START ON 08/23/2020] amphetamine-dextroamphetamine (ADDERALL XR) 30 MG 24 hr capsule Take 1 capsule (30 mg total) by mouth every morning. 30 capsule 0  . [START ON 07/24/2020] amphetamine-dextroamphetamine (ADDERALL XR) 30 MG 24 hr capsule Take 1 capsule (30 mg total) by mouth every morning. 30 capsule 0  . amphetamine-dextroamphetamine (ADDERALL XR) 30 MG 24 hr capsule Take 1 capsule (30 mg total) by mouth every morning. 30 capsule 0  . [  START ON 08/23/2020] amphetamine-dextroamphetamine (ADDERALL) 10 MG tablet Take 1 tablet (10 mg total) by mouth daily at 4 PM. 30 tablet 0  . amphetamine-dextroamphetamine (ADDERALL) 10 MG tablet Take 1 tablet (10 mg total) by mouth daily at 4 PM. 30 tablet 0  . amphetamine-dextroamphetamine (ADDERALL) 10 MG tablet Take 1 tablet (10 mg total) by mouth daily at 4 PM. 30 tablet 0  . azithromycin (ZITHROMAX) 250 MG tablet Take 2 tablets (500 mg) on the first day. Then take 1 tablet (250mg ) on days 2-5. 6 tablet 0  . benzonatate (TESSALON) 100 MG capsule Take 1 capsule (100 mg total) by mouth 3 (three) times daily as needed for cough. 30 capsule 0  . mometasone (ELOCON) 0.1 % lotion Apply topically as directed. Apply to scalp let sit overnight and rinse out in the am, use qhs for 2 weeks, then decrease to 2 times weekly until clear, then prn flares 60 mL 2  . predniSONE (STERAPRED UNI-PAK 21 TAB) 10 MG (21) TBPK tablet Take as directed 21 tablet 0   No current facility-administered medications for this visit.      Psychiatric Specialty Exam: Review of Systems  Constitutional: Positive for fatigue.  Psychiatric/Behavioral: The patient is nervous/anxious.   All other systems reviewed and are negative.   There were no vitals taken for this visit.There is no height or weight on file to calculate BMI.  General Appearance: NA  Eye Contact:  NA   Speech:  Clear and Coherent and Normal Rate  Volume:  Normal  Mood:  Anxious  Affect:  NA  Thought Process:  Goal Directed and Linear  Orientation:  Full (Time, Place, and Person)  Thought Content: Logical   Suicidal Thoughts:  No  Homicidal Thoughts:  No  Memory:  Immediate;   Good Recent;   Good Remote;   Good  Judgement:  Good  Insight:  Good  Psychomotor Activity:  NA  Concentration:  Concentration: Good  Recall:  Good  Fund of Knowledge: Good  Language: Good  Akathisia:  Negative  Handed:  Right  AIMS (if indicated): not done  Assets:  Communication Skills Desire for Improvement Financial Resources/Insurance Housing Social Support Talents/Skills  ADL's:  Intact  Cognition: WNL  Sleep:  Good   Screenings: PHQ2-9   Flowsheet Row Office Visit from 07/24/2019 in [facility] Visit from 06/17/2019 in [facility] [place] Office Visit from 01/23/2019 in [facility] [place]  PHQ-2 Total Score 0 2 2  PHQ-9 Total Score - 13 10       Assessment and Plan: 41 yo married female who comes reporting long standing problems with anxiety, concentration, task completion, low self esteem.  She has always struggled with lack of concentration, procrastination, task completion since early elementary school. Her grades were low and she had difficult times on tests in particular. She does not appear to have a significant hyperactivity component. Her self esteem was low as a consequence of these struggles and she always worried about her ability to perform well and satisfy other people expectations of her. Some depression has also been present. She never had suicidal thoughts; there is no clear indication of bipolar (hypomanic episodes) presence. She was never diagnosed or treated for ADHD as a child although her brother was - he had a clear hyperactivity component and was prescribed Adderall. Her sleep is good, appetite normal. In the past  she was treated for anxiety and post-partum depression and had trials  of fluoxetine, paroxetine, sertraline and escitalopram. She remembers gaining weight on some of them and did not continue for that reason.We have added fluoxetine for anxiety/depression and while it helps she also feels it makes her sedated ("hung-over"). She used to bite nails when anxious and she does not do that anymore. Adderall XR was added for ADD and it helps to some extent but does not work long enough.She could not tolerate bid dosing due to insomnia. We then added IR 10 mg in PM which she seldom uses. She now works at [employer].  Dx: GAD; Depression unspecified; Adult ADHD  Plan: We willtaper off fluoxetine and try vortioxetine 10 mg for anxiety/depression. We will continue Adderall IR 10 mg in PM prn and Adderall XR 30 mg daily in AM.Next visit in6 weeks with a new provider.The plan was discussed with patient who had an opportunity to ask questions and these were all answered. I spend15 minutes in phone consultation with the patient.   Magdalene Patricia, MD 06/23/2020, 9:46 AM

## 2020-06-30 ENCOUNTER — Telehealth: Payer: 59 | Admitting: Family Medicine

## 2020-06-30 ENCOUNTER — Encounter: Payer: Self-pay | Admitting: Family Medicine

## 2020-06-30 ENCOUNTER — Other Ambulatory Visit (HOSPITAL_COMMUNITY): Payer: Self-pay | Admitting: Family Medicine

## 2020-06-30 DIAGNOSIS — R0981 Nasal congestion: Secondary | ICD-10-CM | POA: Diagnosis not present

## 2020-06-30 MED ORDER — LORATADINE 10 MG PO TABS: 10.0000 mg | ORAL_TABLET | Freq: Every day | ORAL | 0 refills | Status: DC

## 2020-06-30 MED ORDER — SALINE NASAL SPRAY 0.65 % NA SOLN: 1.0000 | NASAL | 0 refills | Status: DC | PRN

## 2020-06-30 MED ORDER — FLUTICASONE PROPIONATE 50 MCG/ACT NA SUSP: 2.0000 | Freq: Every day | NASAL | 0 refills | Status: DC

## 2020-06-30 NOTE — Progress Notes (Signed)
Crystal Hammond, cotrell are scheduled for a virtual visit with your provider today.    Just as we do with appointments in the office, we must obtain your consent to participate.  Your consent will be active for this visit and any virtual visit you may have with one of our providers in the next 365 days.    If you have a MyChart account, I can also send a copy of this consent to you electronically.  All virtual visits are billed to your insurance company just like a traditional visit in the office.  As this is a virtual visit, video technology does not allow for your provider to perform a traditional examination.  This may limit your provider's ability to fully assess your condition.  If your provider identifies any concerns that need to be evaluated in person or the need to arrange testing such as labs, EKG, etc, we will make arrangements to do so.    Although advances in technology are sophisticated, we cannot ensure that it will always work on either your end or our end.  If the connection with a video visit is poor, we may have to switch to a telephone visit.  With either a video or telephone visit, we are not always able to ensure that we have a secure connection.   I need to obtain your verbal consent now.   Are you willing to proceed with your visit today?   ALAIA LORDI has provided verbal consent on 06/30/2020 for a virtual visit (video or telephone).   Freddy Finner, NP 06/30/2020  8:20 AM   Date:  06/30/2020   ID:  Crystal Hammond, DOB 08-Aug-1980, MRN [id number]  Patient Location: Home Provider Location: Home Office   Participants: Patient and Provider for Visit and Wrap up  Method of visit: Video  Location of Patient: Home Location of Provider: Home Office Consent was obtain for visit over the video. Services rendered by provider: Visit was performed via video  A video enabled telemedicine application was used and I verified that I am speaking with the correct person using two  identifiers.  PCP:  Emi Belfast, FNP (Inactive)   Chief Complaint:  Sinus symptoms   History of Present Illness:    Crystal Hammond is a 40 y.o. female with history as stated below. Presents video telehealth for an acute care visit secondary to sinus symptoms that started 2 days ago.  Was proactive and started [facility] from [facility], some use of Ibuprofen as well. But has not had much relief. Just felt it is getting worse. Reports some face is swollen- pressure in the maxillary area. Some ear pain and sore throat pain. Denies fevers or chills. Denies cough or shortness of breath No recent covid test. Exposure risk unsure. No sick contacts.  Past Medical, Surgical, Social History, Allergies, and Medications have been Reviewed.  Past Medical History:  Diagnosis Date  . Anxiety   . Cervical dysplasia   . Complication of anesthesia    LOW BLOOD PRESSURE  . Depression   . Endometriosis   . 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 Heart murmur    OUTGREW  . History of febrile seizure   . History of kidney stones   . History of pneumonia   . Migraines   . OCD (obsessive compulsive disorder)   . Oral herpes   . Pneumonia   . PONV (postoperative nausea and vomiting)   . Postpartum depression   . Pre-diabetes   .  PTSD (post-traumatic stress disorder)   . Recurrent UTI     No outpatient medications have been marked as taking for the 06/30/20 encounter (Appointment) with Freddy Finner, NP.     Allergies:   Other, Phenazopyridine, Benadryl [diphenhydramine], and Pyridium [phenazopyridine hcl]   ROS See HPI for history of present illness.  Physical Exam Constitutional:      Appearance: Normal appearance.  HENT:     Head: Normocephalic and atraumatic.     Right Ear: External ear normal.     Left Ear: External ear normal.     Nose:     Comments: Nasal congestion/tone noted in conversation Eyes:     Conjunctiva/sclera: Conjunctivae normal.  Pulmonary:     Comments: No shortness  of breath or cough in conversation  Musculoskeletal:        General: Normal range of motion.     Cervical back: Normal range of motion.  Neurological:     Mental Status: She is alert and oriented to person, place, and time.  Psychiatric:        Mood and Affect: Mood normal.        Behavior: Behavior normal.        Thought Content: Thought content normal.        Judgment: Judgment normal.               A&P  1. Nasal sinus congestion -s&S are consistent with allergies- given the season as well, not a long enough duration to warrant ABX at this time. -OTC and scripts given for allergy relief. -advised if not improved in 4-5 days to call back or reach out to PCP -Patient acknowledged agreement and understanding of the plan.    - fluticasone (FLONASE) 50 MCG/ACT nasal spray; Place 2 sprays into both nostrils daily.  Dispense: 16 g; Refill: 0 - sodium chloride (OCEAN) 0.65 % nasal spray; Place 1 spray into the nose as needed for congestion.  Dispense: 60 mL; Refill: 0 - loratadine (CLARITIN) 10 MG tablet; Take 1 tablet (10 mg total) by mouth daily.  Dispense: 90 tablet; Refill: 0   Time:   Today, I have spent 10 minutes with the patient with telehealth technology discussing the above problems, reviewing the chart, previous notes, medications and orders.    Tests Ordered: No orders of the defined types were placed in this encounter.   Medication Changes: No orders of the defined types were placed in this encounter.    Disposition:  Follow up as needed Signed, Freddy Finner, NP  06/30/2020 8:20 AM

## 2020-06-30 NOTE — Patient Instructions (Signed)
Most likely your symptoms are allergy related given the start of Spring season. However if allergies are not treated on time or right, it can lead to infection of the sinus area. Please consider a netti pot (use distilled or boiled water to wash the area out) Use the medications ordered today. You should probably take an allergy medication for the next month or more if you do have season allergies.   If you symptoms worsen or last longer than 6-7 days please call use back and we can readdress the need for an antibiotic.   Continue to use ibuprofen for pain and tylenol if needed.  You might want to consider a COVID test as well   How to Perform a Sinus Rinse A sinus rinse is a home treatment. It rinses your sinuses with a mixture of salt and water (saline solution). Sinuses are air-filled spaces in your skull behind the bones of your face and forehead. They open into your nasal cavity. A sinus rinse can help to clear your nasal cavity. It can clear mucus, dirt, dust, or pollen. You may do a sinus rinse when you have:  A cold.  A virus.  Allergies.  A sinus infection.  A stuffy nose. Talk with your doctor about whether a sinus rinse might help you. What are the risks? A sinus rinse is normally very safe and helpful. However, there are a few risks. These include:  A burning feeling in the sinuses. This may happen if you do not make the saline solution as instructed. Be sure to follow all directions when making the saline solution.  Nasal irritation.  Infection from unclean water. This is rare, but possible. Do not do a sinus rinse if you have had:  Ear or nasal surgery.  An ear infection.  Blocked ears. Supplies needed:  Saline solution or powder.  Distilled or germ-free (sterile) water may be needed to mix with saline powder. ? You may use boiled and cooled tap water. Boil tap water for 5 minutes; cool until it is lukewarm. Use within 24 hours. ? Do not use regular tap  water to mix with the saline solution.  Neti pot or nasal rinse bottle. This releases the saline solution into your nose and through your sinuses. You can buy neti pots and rinse bottles: ? At your local pharmacy. ? At a health food store. ? Online. How to perform a sinus rinse 1. Wash your hands with soap and water. 2. Wash your device using the directions that came with it. 3. Dry your device. 4. Use the solution that comes with your device or one that is sold separately in stores. Follow the mixing directions on the package if you need to mix with sterile or distilled water. 5. Fill your device with the amount of saline solution stated in the device instructions. 6. Stand over a sink and tilt your head sideways over the sink. 7. Place the spout of the device in your upper nostril (the one closer to the ceiling). 8. Gently pour or squeeze the saline solution into your nasal cavity. The liquid should drain to your lower nostril if you are not too stuffed up (congested). 9. While rinsing, breathe through your open mouth. 10. Gently blow your nose to clear any mucus and rinse solution. Blowing too hard may cause ear pain. 11. Repeat in your other nostril. 12. Clean and rinse your device with clean water. 13. Air-dry your device. Talk with your doctor or pharmacist if you have  questions about how to do a sinus rinse.   Summary  A sinus rinse is a home treatment. It rinses your sinuses with a mixture of salt and water (saline solution).  A sinus rinse is normally very safe and helpful. Follow all instructions carefully.  Talk with your doctor about whether a sinus rinse might help you. This information is not intended to replace advice given to you by your health care provider. Make sure you discuss any questions you have with your health care provider. Document Revised: 01/12/2020 Document Reviewed: 01/12/2020 Elsevier Patient Education  2021 [employer].

## 2020-07-13 ENCOUNTER — Telehealth (HOSPITAL_COMMUNITY): Payer: Self-pay | Admitting: *Deleted

## 2020-07-13 NOTE — Telephone Encounter (Signed)
Pt called wanting Adderall sent to [facility] employee pharmacy. Currently at [facility] [address] [place]. Thanks.

## 2020-07-14 ENCOUNTER — Other Ambulatory Visit (HOSPITAL_COMMUNITY): Payer: Self-pay | Admitting: Psychiatry

## 2020-07-14 MED ORDER — AMPHETAMINE-DEXTROAMPHET ER 30 MG PO CP24: 30.0000 mg | ORAL_CAPSULE | ORAL | 0 refills | Status: DC

## 2020-07-14 MED ORDER — AMPHETAMINE-DEXTROAMPHETAMINE 10 MG PO TABS: 10.0000 mg | ORAL_TABLET | Freq: Every day | ORAL | 0 refills | Status: DC

## 2020-07-14 MED ORDER — AMPHETAMINE-DEXTROAMPHETAMINE 10 MG PO TABS
10.0000 mg | ORAL_TABLET | Freq: Every day | ORAL | 0 refills | Status: DC
Start: 2020-07-14 — End: 2020-12-06

## 2020-07-14 NOTE — Telephone Encounter (Signed)
Done

## 2020-07-22 ENCOUNTER — Telehealth: Payer: Self-pay

## 2020-07-22 ENCOUNTER — Encounter: Payer: Self-pay | Admitting: Family Medicine

## 2020-07-22 ENCOUNTER — Other Ambulatory Visit: Payer: Self-pay

## 2020-07-22 ENCOUNTER — Ambulatory Visit (INDEPENDENT_AMBULATORY_CARE_PROVIDER_SITE_OTHER): Payer: 59 | Admitting: Family Medicine

## 2020-07-22 VITALS — BP 110/70 | HR 90 | Temp 97.9°F | Wt 176.0 lb

## 2020-07-22 DIAGNOSIS — L299 Pruritus, unspecified: Secondary | ICD-10-CM | POA: Insufficient documentation

## 2020-07-22 DIAGNOSIS — F411 Generalized anxiety disorder: Secondary | ICD-10-CM | POA: Diagnosis not present

## 2020-07-22 MED ORDER — VORTIOXETINE HBR 10 MG PO TABS
10.0000 mg | ORAL_TABLET | Freq: Every day | ORAL | 1 refills | Status: AC
  Filled 2020-07-22: qty 30, 30d supply, fill #0

## 2020-07-22 MED ORDER — HYDROXYZINE HCL 10 MG PO TABS
10.0000 mg | ORAL_TABLET | Freq: Three times a day (TID) | ORAL | 0 refills | Status: DC | PRN
  Filled 2020-07-22: qty 30, 10d supply, fill #0

## 2020-07-22 NOTE — Patient Instructions (Addendum)
I have sent  Trintellix to pharmacy for you to start ... continue trying to reach psychiatry for future refills and follow up.  Use nonalcohol moisturizer.  We will set up allergy referral.  Bring in FMLA paperwork to be completed... return 08/03/2020

## 2020-07-22 NOTE — Progress Notes (Signed)
Patient ID: Crystal Hammond, female    DOB: 22-Sep-1980, 40 y.o.   MRN: [id number]  This visit was conducted in person.  BP 110/70   Pulse 90   Temp 97.9 F (36.6 C) (Temporal)   Wt 176 lb (79.8 kg)   SpO2 98%   BMI 34.37 kg/m    CC:  Chief Complaint  Patient presents with  . Itchy    For many months... no changes to detergents or soaps.... pt had been told she was allergic to Benadryl so has not tried....   . Referral    Allergy for testing....  . Anxiety    Referral for grief counseling--- found out her brother passed away in a MVA Jul 06, 2020 while she was at work... seems to have increase in anxiety while at work    Subjective:   HPI: Crystal Hammond is a 40 y.o. female presenting on 07/22/2020 for Itchy (For many months... no changes to detergents or soaps.... pt had been told she was allergic to Benadryl so has not tried.... ), Referral (Allergy for testing....), and Anxiety (Referral for grief counseling--- found out her brother passed away in a MVA 07-06-20 while she was at work... seems to have increase in anxiety while at work)    Excessive itching: Has seen dermatology.. wishes to have allergist referral for allergy testing. No rash. All over, worst on back  Ongoing x month, worsening.  No change in exposures.  Tried sensitive soap, clartin and allegra.   GAD, poor control.. sees psychiatry [facility]) but has had issues getting refills. Sent to wrong pharmacy etc, insurance won't pay.  Requesting short term refill of  Trintellix... was in process of weaning off lexapro and was to new start trintellix at last appt 29-Jun-2022 Brother passed away 2022/07/08 , MVA...dealing with grief.   Went back to work  the week following! 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  Cannot concentrated, poor sleep     Relevant past medical, surgical, family and social history reviewed and updated as indicated. Interim medical  history since our last visit reviewed. Allergies and medications reviewed and updated. Outpatient Medications Prior to Visit  Medication Sig Dispense Refill  . [START ON 08/23/2020] amphetamine-dextroamphetamine (ADDERALL XR) 30 MG 24 hr capsule Take 1 capsule (30 mg total) by mouth every morning. 30 capsule 0  . [START ON 07/24/2020] amphetamine-dextroamphetamine (ADDERALL XR) 30 MG 24 hr capsule Take 1 capsule (30 mg total) by mouth every morning. 30 capsule 0  . amphetamine-dextroamphetamine (ADDERALL XR) 30 MG 24 hr capsule Take 1 capsule (30 mg total) by mouth every morning. 30 capsule 0  . amphetamine-dextroamphetamine (ADDERALL XR) 30 MG 24 hr capsule TAKE 1 CAPSULE BY MOUTH ONCE DAILY EVERY MORNING. 30 capsule 0  . amphetamine-dextroamphetamine (ADDERALL XR) 30 MG 24 hr capsule TAKE 1 CAPSULE BY MOUTH ONCE DAILY EVERY MORNING. FILL 07/24/20 30 capsule 0  . amphetamine-dextroamphetamine (ADDERALL XR) 30 MG 24 hr capsule TAKE 1 CAPSULE BY MOUTH DAILY EVERY MORNING. FILL 08/23/20 30 capsule 0  . [START ON 08/23/2020] amphetamine-dextroamphetamine (ADDERALL) 10 MG tablet Take 1 tablet (10 mg total) by mouth daily at 4 PM. 30 tablet 0  . amphetamine-dextroamphetamine (ADDERALL) 10 MG tablet Take 1 tablet (10 mg total) by mouth daily at 4 PM. 30 tablet 0  . amphetamine-dextroamphetamine (ADDERALL) 10 MG tablet Take 1 tablet (10 mg total) by mouth daily at 4 PM. 30 tablet 0  .  amphetamine-dextroamphetamine (ADDERALL) 10 MG tablet TAKE 1 TABLET BY MOUTH DAILY AT 4 PM. 30 tablet 0  . amphetamine-dextroamphetamine (ADDERALL) 10 MG tablet TAKE 1 TABLET BY MOUTH DAILY AT 4 PM. 30 tablet 0  . amphetamine-dextroamphetamine (ADDERALL) 10 MG tablet TAKE 1 TABLET BY MOUTH DAILY AT 4 PM. FILL 08/23/20 30 tablet 0  . loratadine (CLARITIN) 10 MG tablet TAKE 1 TABLET (10 MG TOTAL) BY MOUTH DAILY. 90 tablet 0  . mometasone (ELOCON) 0.1 % lotion APPLY TO SCALP LET SIT OVERNIGHT & RINSE OUT IN THE AM, USE AT NIGHT X2WKS  DECREASE TO 2 TIMES PER WEEK UNTIL CLEAR, THEN AS NEEDED FOR FLARES 60 mL 2  . predniSONE (STERAPRED UNI-PAK 21 TAB) 10 MG (21) TBPK tablet Take as directed 21 tablet 0  . sodium chloride (OCEAN) 0.65 % nasal spray PLACE 1 SPRAY INTO THE NOSE AS NEEDED FOR CONGESTION. 60 mL 0  . vortioxetine HBr (TRINTELLIX) 10 MG TABS tablet Take 1 tablet (10 mg total) by mouth daily. 30 tablet 1  . azithromycin (ZITHROMAX) 250 MG tablet Take 2 tablets (500 mg) on the first day. Then take 1 tablet (250mg ) on days 2-5. 6 tablet 0  . benzonatate (TESSALON) 100 MG capsule Take 1 capsule (100 mg total) by mouth 3 (three) times daily as needed for cough. 30 capsule 0  . fluticasone (FLONASE) 50 MCG/ACT nasal spray Place 2 sprays into both nostrils daily. 16 g 0  . fluticasone (FLONASE) 50 MCG/ACT nasal spray PLACE 2 SPRAYS INTO BOTH NOSTRILS DAILY. 16 g 0  . loratadine (CLARITIN) 10 MG tablet Take 1 tablet (10 mg total) by mouth daily. 90 tablet 0  . mometasone (ELOCON) 0.1 % lotion Apply topically as directed. Apply to scalp let sit overnight and rinse out in the am, use qhs for 2 weeks, then decrease to 2 times weekly until clear, then prn flares 60 mL 2  . sodium chloride (OCEAN) 0.65 % nasal spray Place 1 spray into the nose as needed for congestion. 60 mL 0   No facility-administered medications prior to visit.     Per HPI unless specifically indicated in ROS section below Review of Systems  Constitutional: Negative for fatigue and fever.  HENT: Negative for congestion.   Eyes: Negative for pain.  Respiratory: Negative for cough and shortness of breath.   Cardiovascular: Negative for chest pain, palpitations and leg swelling.  Gastrointestinal: Negative for abdominal pain.  Genitourinary: Negative for dysuria and vaginal bleeding.  Musculoskeletal: Negative for back pain.  Neurological: Negative for syncope, light-headedness and headaches.  Psychiatric/Behavioral: Negative for dysphoric mood.    Objective:  BP 110/70   Pulse 90   Temp 97.9 F (36.6 C) (Temporal)   Wt 176 lb (79.8 kg)   SpO2 98%   BMI 34.37 kg/m   Wt Readings from Last 3 Encounters:  07/22/20 176 lb (79.8 kg)  01/25/20 174 lb 4 oz (79 kg)  12/11/19 171 lb 15.3 oz (78 kg)      Physical Exam Constitutional:      General: She is not in acute distress.    Appearance: Normal appearance. She is well-developed. She is not ill-appearing or toxic-appearing.  HENT:     Head: Normocephalic.     Right Ear: Hearing, tympanic membrane, ear canal and external ear normal. Tympanic membrane is not erythematous, retracted or bulging.     Left Ear: Hearing, tympanic membrane, ear canal and external ear normal. Tympanic membrane is not erythematous, retracted or bulging.  Nose: No mucosal edema or rhinorrhea.     Right Sinus: No maxillary sinus tenderness or frontal sinus tenderness.     Left Sinus: No maxillary sinus tenderness or frontal sinus tenderness.     Mouth/Throat:     Pharynx: Uvula midline.  Eyes:     General: Lids are normal. Lids are everted, no foreign bodies appreciated.     Conjunctiva/sclera: Conjunctivae normal.     Pupils: Pupils are equal, round, and reactive to light.  Neck:     Thyroid: No thyroid mass or thyromegaly.     Vascular: No carotid bruit.     Trachea: Trachea normal.  Cardiovascular:     Rate and Rhythm: Normal rate and regular rhythm.     Pulses: Normal pulses.     Heart sounds: Normal heart sounds, S1 normal and S2 normal. No murmur heard. No friction rub. No gallop.   Pulmonary:     Effort: Pulmonary effort is normal. No tachypnea or respiratory distress.     Breath sounds: Normal breath sounds. No decreased breath sounds, wheezing, rhonchi or rales.  Abdominal:     General: Bowel sounds are normal.     Palpations: Abdomen is soft.     Tenderness: There is no abdominal tenderness.  Musculoskeletal:     Cervical back: Normal range of motion and neck supple.  Skin:     General: Skin is warm and dry.     Findings: No rash.  Neurological:     Mental Status: She is alert.  Psychiatric:        Mood and Affect: Mood is not anxious or depressed.        Speech: Speech normal.        Behavior: Behavior normal. Behavior is cooperative.        Thought Content: Thought content normal.        Judgment: Judgment normal.       Results for orders placed or performed in visit on 01/22/20  Vitamin D, 25-hydroxy  Result Value Ref Range   VITD 22.36 (L) 30.00 - 100.00 ng/mL  High sensitivity CRP  Result Value Ref Range   CRP, High Sensitivity 6.360 (H) 0.000 - 5.000 mg/L  Lipid panel  Result Value Ref Range   Cholesterol 122 0 - 200 mg/dL   Triglycerides 606.3 0.0 - 149.0 mg/dL   HDL 01.60 >10.93 mg/dL   VLDL 23.5 0.0 - 57.3 mg/dL   LDL Cholesterol 59 0 - 99 mg/dL   Total CHOL/HDL Ratio 3    NonHDL 82.18   Hemoglobin A1c  Result Value Ref Range   Hgb A1c MFr Bld 6.0 4.6 - 6.5 %    This visit occurred during the SARS-CoV-2 public health emergency.  Safety protocols were in place, including screening questions prior to the visit, additional usage of staff PPE, and extensive cleaning of exam room while observing appropriate contact time as indicated for disinfecting solutions.   COVID 19 screen:  No recent travel or known exposure to COVID19 The patient denies respiratory symptoms of COVID 19 at this time. The importance of social distancing was discussed today.   Assessment and Plan  Problem List Items Addressed This Visit    GAD (generalized anxiety disorder)    I have sent  Trintellix to pharmacy as prescribed by previous psychiatrist ... continue trying to reach psychiatry for future refills and follow up.   Will complete FMLA       Relevant Medications   hydrOXYzine (ATARAX/VISTARIL) 10 MG  tablet   vortioxetine HBr (TRINTELLIX) 10 MG TABS tablet   Other Relevant Orders   Ambulatory referral to Psychology   Itching - Primary    ? Due to mood  issues. Refer to allergist.  Apply topical moisturizer and use dye free hypo allergenic products.      Relevant Orders   Ambulatory referral to Allergy       Kerby NoraAmy Sabine Tenenbaum, MD

## 2020-07-22 NOTE — Telephone Encounter (Signed)
Okay to provide work note 

## 2020-07-22 NOTE — Telephone Encounter (Signed)
Patient called and wanted to know if she can get a work note for this weekend for her anxiety until she can get the FMLA paperwork done? Please advise.

## 2020-07-22 NOTE — Telephone Encounter (Signed)
Work note written as instructed by Dr. Ermalene Searing and sent to [facility].

## 2020-08-02 ENCOUNTER — Encounter: Payer: Self-pay | Admitting: Family Medicine

## 2020-08-02 ENCOUNTER — Telehealth: Payer: Self-pay

## 2020-08-02 DIAGNOSIS — F4381 Prolonged grief disorder: Secondary | ICD-10-CM | POA: Insufficient documentation

## 2020-08-02 DIAGNOSIS — Z0279 Encounter for issue of other medical certificate: Secondary | ICD-10-CM

## 2020-08-02 DIAGNOSIS — F4329 Adjustment disorder with other symptoms: Secondary | ICD-10-CM | POA: Insufficient documentation

## 2020-08-02 NOTE — Telephone Encounter (Signed)
FMLA paperwork semi completed  Placed in PCP's inbox for review, completion, sign and date

## 2020-08-02 NOTE — Telephone Encounter (Signed)
Spoke w pt about FMLA paperwork  Beginning FMLA paperwork

## 2020-08-02 NOTE — Telephone Encounter (Signed)
Completed in outbox.

## 2020-08-03 NOTE — Telephone Encounter (Signed)
Called pt to inform FMLA paperwork was completed and faxed  Copy up front for pt  Copy for scan  Copy for billing  Copy retained by me

## 2020-08-08 ENCOUNTER — Telehealth: Payer: Self-pay | Admitting: Family Medicine

## 2020-08-08 NOTE — Telephone Encounter (Signed)
Received FMLA paperwork via fax from [employer] absence management. Given to Tamera to complete. EM

## 2020-08-08 NOTE — Telephone Encounter (Signed)
This was a duplicate request for information that had been faxed on 08/03/20

## 2020-08-11 ENCOUNTER — Telehealth: Payer: Self-pay | Admitting: Family Medicine

## 2020-08-11 NOTE — Telephone Encounter (Signed)
Fmla paperwork given to [employer].  EM

## 2020-08-15 NOTE — Telephone Encounter (Signed)
Called pt to inform paperwork finished and faxed  Copy for pt pick up  Copy for scan   Copy retained by me

## 2020-08-15 NOTE — Telephone Encounter (Signed)
Pt was denied FMLA leave of absence, pt was advised to apply for STD  Completed paperwork placed in PCP's inbox for review, signature and date

## 2020-08-15 NOTE — Telephone Encounter (Signed)
Signed and in outbox.

## 2020-08-15 NOTE — Telephone Encounter (Signed)
Called pt because we got duplicate FMLA paperwork, pt informed me her leave of absence was denied- pt was suggested to apply for STD  Beginning paperwork

## 2020-08-24 NOTE — Assessment & Plan Note (Signed)
?   Due to mood issues. Refer to allergist.  Apply topical moisturizer and use dye free hypo allergenic products.

## 2020-08-24 NOTE — Assessment & Plan Note (Addendum)
I have sent  Trintellix to pharmacy as prescribed by previous psychiatrist ... continue trying to reach psychiatry for future refills and follow up.   Will complete FMLA

## 2020-08-26 ENCOUNTER — Other Ambulatory Visit (HOSPITAL_COMMUNITY): Payer: Self-pay | Admitting: Psychiatry

## 2020-08-29 ENCOUNTER — Other Ambulatory Visit: Payer: Self-pay

## 2020-08-29 ENCOUNTER — Other Ambulatory Visit (HOSPITAL_COMMUNITY): Payer: Self-pay | Admitting: Psychiatry

## 2020-08-30 ENCOUNTER — Other Ambulatory Visit: Payer: Self-pay

## 2020-08-31 ENCOUNTER — Other Ambulatory Visit: Payer: Self-pay

## 2020-09-02 ENCOUNTER — Other Ambulatory Visit: Payer: Self-pay

## 2020-09-05 ENCOUNTER — Other Ambulatory Visit: Payer: Self-pay

## 2020-09-06 ENCOUNTER — Other Ambulatory Visit: Payer: Self-pay

## 2020-09-06 MED FILL — Amphetamine-Dextroamphetamine Tab 10 MG: ORAL | 30 days supply | Qty: 30 | Fill #0 | Status: AC

## 2020-09-06 MED FILL — Amphetamine-Dextroamphetamine Cap ER 24HR 30 MG: ORAL | 30 days supply | Qty: 30 | Fill #0 | Status: AC

## 2020-09-07 ENCOUNTER — Other Ambulatory Visit: Payer: Self-pay

## 2020-09-16 ENCOUNTER — Other Ambulatory Visit: Payer: Self-pay

## 2020-09-21 ENCOUNTER — Ambulatory Visit: Payer: 59 | Admitting: Dermatology

## 2020-11-04 ENCOUNTER — Telehealth: Payer: 59 | Admitting: Physician Assistant

## 2020-11-04 ENCOUNTER — Other Ambulatory Visit: Payer: Self-pay

## 2020-11-04 DIAGNOSIS — Z20822 Contact with and (suspected) exposure to covid-19: Secondary | ICD-10-CM | POA: Diagnosis not present

## 2020-11-04 MED ORDER — ALBUTEROL SULFATE HFA 108 (90 BASE) MCG/ACT IN AERS
2.0000 | INHALATION_SPRAY | Freq: Four times a day (QID) | RESPIRATORY_TRACT | 0 refills | Status: DC | PRN
  Filled 2020-11-04: qty 18, 30d supply, fill #0

## 2020-11-04 MED ORDER — BENZONATATE 100 MG PO CAPS
100.0000 mg | ORAL_CAPSULE | Freq: Three times a day (TID) | ORAL | 0 refills | Status: DC | PRN
  Filled 2020-11-04: qty 30, 10d supply, fill #0

## 2020-11-04 MED FILL — Amphetamine-Dextroamphetamine Cap ER 24HR 30 MG: ORAL | 30 days supply | Qty: 30 | Fill #0 | Status: AC

## 2020-11-04 NOTE — Progress Notes (Signed)
E-Visit for Corona Virus Screening  Your current symptoms could be consistent with the coronavirus.  Many health care providers can now test patients at their office but not all are.  [facility] has multiple testing sites. For information on our COVID testing locations and hours go to [email or web address]  We are enrolling you in our MyChart Home Monitoring for COVID19 . Daily you will receive a questionnaire within the MyChart website. Our COVID 19 response team will be monitoring your responses daily.  Testing Information: The COVID-19 Community Testing sites are testing BY APPOINTMENT ONLY.  You can schedule online at [email or web address]  If you do not have access to a smart phone or computer you may call [phone] for an appointment.   Additional testing sites in the Community:  For [facility] Testing sites in [place]  https://www.[facility]vs.com/minuteclinic/covid-19-testing  For Pop-up testing sites in [place]  https://covid19.ncdhhs.gov/about-covid-19/testing/find-my-testing-place/pop-testing-sites  For [facility] https://[place].gov[place]/human-services/health-department/coronavirus-covid-19-info/covid-19-testing  For [place] testing in [place] https://www.[place].gov[place]/human-services/health-department/coronavirus-covid-19-info/covid-19-testing  For [facility] testing in [place]   https://[facility].care/covidtesting  For  more information about community testing call [phone]   Please quarantine yourself while awaiting your test results. Please stay home for a minimum of 10 days from the first day of illness with improving symptoms and you have had 24 hours of no fever (without the use of Tylenol (Acetaminophen) Motrin (Ibuprofen) or any fever reducing medication).  Also - Do not get tested prior to returning to work because once you have had a positive test the test can stay positive for  more than a month in some cases.   You should wear a mask or cloth face covering over your nose and mouth if you must be around other people or animals, including pets (even at home). Try to stay at least 6 feet away from other people. This will protect the people around you.  Please continue good preventive care measures, including:  frequent hand-washing, avoid touching your face, cover coughs/sneezes, stay out of crowds and keep a 6 foot distance from others.  COVID-19 is a respiratory illness with symptoms that are similar to the flu. Symptoms are typically mild to moderate, but there have been cases of severe illness and death due to the virus.   The following symptoms may appear 2-14 days after exposure: Fever Cough Shortness of breath or difficulty breathing Chills Repeated shaking with chills Muscle pain Headache Sore throat New loss of taste or smell Fatigue Congestion or runny nose Nausea or vomiting Diarrhea  Go to the nearest hospital ED for assessment if fever/cough/breathlessness are severe or illness seems like a threat to life.  It is vitally important that if you feel that you have an infection such as this virus or any other virus that you stay home and away from places where you may spread it to others.  You should avoid contact with people age 65 and older.   You can use medication such as prescription cough medication called Tessalon Perles 100 mg. You may take 1-2 capsules every 8 hours as needed for cough and  prescription inhaler called Albuterol MDI 90 mcg /actuation 2 puffs every 4 hours as needed for shortness of breath, wheezing, cough  You may also take acetaminophen (Tylenol) as needed for fever.  Reduce your risk of any infection by using the same precautions used for avoiding the common cold or flu:  Wash your hands often with soap and warm water for at least 20 seconds.  If soap and   water are not readily available, use an alcohol-based hand sanitizer with at  least 60% alcohol.  If coughing or sneezing, cover your mouth and nose by coughing or sneezing into the elbow areas of your shirt or coat, into a tissue or into your sleeve (not your hands). Avoid shaking hands with others and consider head nods or verbal greetings only. Avoid touching your eyes, nose, or mouth with unwashed hands.  Avoid close contact with people who are sick. Avoid places or events with large numbers of people in one location, like concerts or sporting events. Carefully consider travel plans you have or are making. If you are planning any travel outside or inside the [place], visit the CDC's Travelers' Health webpage for the latest health notices. If you have some symptoms but not all symptoms, continue to monitor at home and seek medical attention if your symptoms worsen. If you are having a medical emergency, call 911.  HOME CARE Only take medications as instructed by your medical team. Drink plenty of fluids and get plenty of rest. A steam or ultrasonic humidifier can help if you have congestion.   GET HELP RIGHT AWAY IF YOU HAVE EMERGENCY WARNING SIGNS** FOR COVID-19. If you or someone is showing any of these signs seek emergency medical care immediately. Call 911 or proceed to your closest emergency facility if: You develop worsening high fever. Trouble breathing Bluish lips or face Persistent pain or pressure in the chest New confusion Inability to wake or stay awake You cough up blood. Your symptoms become more severe  **This list is not all possible symptoms. Contact your medical provider for any symptoms that are sever or concerning to you.  MAKE SURE YOU  Understand these instructions. Will watch your condition. Will get help right away if you are not doing well or get worse.  Your e-visit answers were reviewed by a board certified advanced clinical practitioner to complete your personal care plan.  Depending on the condition, your plan could have included  both over the counter or prescription medications.  If there is a problem please reply once you have received a response from your provider.  Your safety is important to [place].  If you have drug allergies check your prescription carefully.    You can use MyChart to ask questions about today's visit, request a non-urgent call back, or ask for a work or school excuse for 24 hours related to this e-Visit. If it has been greater than 24 hours you will need to follow up with your provider, or enter a new e-Visit to address those concerns. You will get an e-mail in the next two days asking about your experience.  I hope that your e-visit has been valuable and will speed your recovery. Thank you for using e-visits.  I provided 6 minutes of non face-to-face time during this encounter for chart review and documentation.

## 2020-11-05 ENCOUNTER — Telehealth: Payer: Self-pay

## 2020-11-05 ENCOUNTER — Encounter (INDEPENDENT_AMBULATORY_CARE_PROVIDER_SITE_OTHER): Payer: Self-pay

## 2020-11-05 NOTE — Telephone Encounter (Signed)
Temp highest 101.5, took Ibuprofen and fever is now 100.0. Pt is alternating Tylenol/Ibuprofen. Pt c/o  chills and body aches. Chills come and go with fever but body aches are constant. Fever started yesterday. Gave pt advice how to treat fever. Fever:  Temperature is the same and is noted to be less than 100.4:  Continue to monitor at home.  Advise patient to stay hydrated, push oral fluids if able, and advise pt. to avoid using multiple blankets and layer of clothing to prevent overheating.  Avoid over use of anti-pyretic medications for fevers less than 101 degrees Fahrenheit. Fever helps fight infection. Worsening temperature, treat if temperature is > 101:  treat with OTC anti-fever medications (Tylenol and/or Ibuprofen)  May alternate Tylenol and Ibuprofen every 3 hours as needed for fever greater than 101. Example: Tylenol at 9AM, Ibuprofen at 12PM, Tylenol at 3PM, Ibuprofen at 6PM, Tylenol at 9PM, Ibuprofen at 12AM, Tylenol at 3AM, Ibuprofen at 6AM. Then restart rotation with Tylenol at 9AM.  If fever remains for greater than 3 days, notify PCP.  If fever becomes greater than 103 and unable to reduce with over the counter medication, contact PCP.  Advised pt to use MyChart to make a virtual visit if needed to discuss fever.  Pt verbalized understanding.

## 2020-11-06 ENCOUNTER — Telehealth: Payer: 59 | Admitting: Family

## 2020-11-06 DIAGNOSIS — J208 Acute bronchitis due to other specified organisms: Secondary | ICD-10-CM

## 2020-11-06 DIAGNOSIS — B9689 Other specified bacterial agents as the cause of diseases classified elsewhere: Secondary | ICD-10-CM

## 2020-11-06 MED ORDER — PREDNISONE 10 MG (21) PO TBPK: ORAL_TABLET | ORAL | 0 refills | Status: DC

## 2020-11-06 MED ORDER — DOXYCYCLINE HYCLATE 100 MG PO TABS: 100.0000 mg | ORAL_TABLET | Freq: Two times a day (BID) | ORAL | 0 refills | Status: DC

## 2020-11-06 NOTE — Progress Notes (Signed)
Virtual Visit Consent   Crystal Hammond, you are scheduled for a virtual visit with a [facility] provider today.     Just as with appointments in the office, your consent must be obtained to participate.  Your consent will be active for this visit and any virtual visit you may have with one of our providers in the next 365 days.     If you have a MyChart account, a copy of this consent can be sent to you electronically.  All virtual visits are billed to your insurance company just like a traditional visit in the office.    As this is a virtual visit, video technology does not allow for your provider to perform a traditional examination.  This may limit your provider's ability to fully assess your condition.  If your provider identifies any concerns that need to be evaluated in person or the need to arrange testing (such as labs, EKG, etc.), we will make arrangements to do so.     Although advances in technology are sophisticated, we cannot ensure that it will always work on either your end or our end.  If the connection with a video visit is poor, the visit may have to be switched to a telephone visit.  With either a video or telephone visit, we are not always able to ensure that we have a secure connection.     I need to obtain your verbal consent now.   Are you willing to proceed with your visit today?    Crystal Hammond has provided verbal consent on 11/06/2020 for a virtual visit (video or telephone).   Jannifer Rodney, FNP   Date: 11/06/2020 11:09 AM   Virtual Visit via Video Note   I, Jannifer Rodney, connected with  Crystal Hammond  ([id number], 1980-11-12) on 11/06/20 at 11:15 AM EDT by a video-enabled telemedicine application and verified that I am speaking with the correct person using two identifiers.  Location: Patient: Virtual Visit Location Patient: Home Provider: Virtual Visit Location Provider: Home   I discussed the limitations of evaluation and management by telemedicine  and the availability of in person appointments. The patient expressed understanding and agreed to proceed.    History of Present Illness: Crystal Hammond is a 40 y.o. who identifies as a female who was assigned female at birth, and is being seen today for cough. She reports she had a negative COVID and flu were negative.   HPI: Cough This is a new problem. The current episode started 1 to 4 weeks ago. The problem has been waxing and waning. The problem occurs every few minutes. Associated symptoms include chills, ear pain (left), a fever, myalgias, nasal congestion, postnasal drip, shortness of breath and wheezing. Pertinent negatives include no sore throat. She has tried rest and OTC cough suppressant (albuterol) for the symptoms. The treatment provided mild relief.   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   Itching 07/22/2020   Acute bronchitis, viral 05/25/2020   Congestion of paranasal sinus 05/25/2020   Vitamin D deficiency 01/26/2020   Incisional hernia 12/11/2019   Acute bronchitis with bronchospasm 09/18/2019   Prediabetes 07/26/2019   GAD (generalized anxiety disorder) 06/25/2019   Adult ADHD 06/25/2019   Depressive disorder 06/25/2019   Diet controlled gestational diabetes mellitus (GDM) in third trimester 05/27/2017   Family history of congenital anomalies 05/27/2017   Herpes 05/27/2017   Hx of cesarean section 05/27/2017  Migraine without aura, not intractable 05/27/2017   Agoraphobia 05/30/2016   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   History of abnormal cervical Pap smear 01/31/2016   History of kidney stones 01/31/2016    Allergies:  Allergies  Allergen Reactions   Other Swelling    SHELLFISH    Phenazopyridine Hives and Rash   Benadryl [Diphenhydramine]    Pyridium [Phenazopyridine Hcl]    Medications:  Current Outpatient Medications:    doxycycline (VIBRA-TABS) 100 MG tablet, Take 1 tablet (100  mg total) by mouth 2 (two) times daily., Disp: 20 tablet, Rfl: 0   predniSONE (STERAPRED UNI-PAK 21 TAB) 10 MG (21) TBPK tablet, Use as directed, Disp: 21 tablet, Rfl: 0   albuterol (VENTOLIN HFA) 108 (90 Base) MCG/ACT inhaler, Inhale 2 puffs into the lungs every 6 (six) hours as needed for wheezing or shortness of breath., Disp: 18 g, Rfl: 0   amphetamine-dextroamphetamine (ADDERALL XR) 30 MG 24 hr capsule, Take 1 capsule (30 mg total) by mouth every morning., Disp: 30 capsule, Rfl: 0   amphetamine-dextroamphetamine (ADDERALL XR) 30 MG 24 hr capsule, Take 1 capsule (30 mg total) by mouth every morning., Disp: 30 capsule, Rfl: 0   amphetamine-dextroamphetamine (ADDERALL XR) 30 MG 24 hr capsule, Take 1 capsule (30 mg total) by mouth every morning., Disp: 30 capsule, Rfl: 0   amphetamine-dextroamphetamine (ADDERALL XR) 30 MG 24 hr capsule, TAKE 1 CAPSULE BY MOUTH ONCE DAILY EVERY MORNING., Disp: 30 capsule, Rfl: 0   amphetamine-dextroamphetamine (ADDERALL XR) 30 MG 24 hr capsule, TAKE 1 CAPSULE BY MOUTH ONCE DAILY EVERY MORNING. FILL 07/24/20, Disp: 30 capsule, Rfl: 0   amphetamine-dextroamphetamine (ADDERALL XR) 30 MG 24 hr capsule, TAKE 1 CAPSULE BY MOUTH DAILY EVERY MORNING. FILL 08/23/20, Disp: 30 capsule, Rfl: 0   amphetamine-dextroamphetamine (ADDERALL) 10 MG tablet, Take 1 tablet (10 mg total) by mouth daily at 4 PM., Disp: 30 tablet, Rfl: 0   amphetamine-dextroamphetamine (ADDERALL) 10 MG tablet, Take 1 tablet (10 mg total) by mouth daily at 4 PM., Disp: 30 tablet, Rfl: 0   amphetamine-dextroamphetamine (ADDERALL) 10 MG tablet, Take 1 tablet (10 mg total) by mouth daily at 4 PM., Disp: 30 tablet, Rfl: 0   amphetamine-dextroamphetamine (ADDERALL) 10 MG tablet, TAKE 1 TABLET BY MOUTH DAILY AT 4 PM., Disp: 30 tablet, Rfl: 0   amphetamine-dextroamphetamine (ADDERALL) 10 MG tablet, TAKE 1 TABLET BY MOUTH DAILY AT 4 PM., Disp: 30 tablet, Rfl: 0   amphetamine-dextroamphetamine (ADDERALL) 10 MG tablet,  TAKE 1 TABLET BY MOUTH DAILY AT 4 PM. FILL 08/23/20, Disp: 30 tablet, Rfl: 0   benzonatate (TESSALON) 100 MG capsule, Take 1 capsule (100 mg total) by mouth 3 (three) times daily as needed., Disp: 30 capsule, Rfl: 0   hydrOXYzine (ATARAX/VISTARIL) 10 MG tablet, Take 1 tablet (10 mg total) by mouth 3 (three) times daily as needed., Disp: 30 tablet, Rfl: 0   loratadine (CLARITIN) 10 MG tablet, TAKE 1 TABLET (10 MG TOTAL) BY MOUTH DAILY., Disp: 90 tablet, Rfl: 0   mometasone (ELOCON) 0.1 % lotion, APPLY TO SCALP LET SIT OVERNIGHT & RINSE OUT IN THE AM, USE AT NIGHT X2WKS DECREASE TO 2 TIMES PER WEEK UNTIL CLEAR, THEN AS NEEDED FOR FLARES, Disp: 60 mL, Rfl: 2   sodium chloride (OCEAN) 0.65 % nasal spray, PLACE 1 SPRAY INTO THE NOSE AS NEEDED FOR CONGESTION., Disp: 60 mL, Rfl: 0  Observations/Objective: Patient is well-developed, well-nourished in no acute distress.  Resting comfortably  at home.  Head is normocephalic,  atraumatic.  No labored breathing.  Speech is clear and coherent with logical content.  Patient is alert and oriented at baseline.  Nasal congestion, no SOB or distress noted  Assessment and Plan: 1. Acute bronchitis, bacterial - doxycycline (VIBRA-TABS) 100 MG tablet; Take 1 tablet (100 mg total) by mouth 2 (two) times daily.  Dispense: 20 tablet; Refill: 0 - predniSONE (STERAPRED UNI-PAK 21 TAB) 10 MG (21) TBPK tablet; Use as directed  Dispense: 21 tablet; Refill: 0 - Take meds as prescribed - Use a cool mist humidifier  -Use saline nose sprays frequently -Force fluids -For any cough or congestion  Use plain Mucinex- regular strength or max strength is fine -For fever or aces or pains- take tylenol or ibuprofen. -Throat lozenges if help -If symptoms worsen or do not improve go to ED.   Follow Up Instructions: I discussed the assessment and treatment plan with the patient. The patient was provided an opportunity to ask questions and all were answered. The patient agreed with  the plan and demonstrated an understanding of the instructions.  A copy of instructions were sent to the patient via MyChart.  The patient was advised to call back or seek an in-person evaluation if the symptoms worsen or if the condition fails to improve as anticipated.  Time:  I spent 10 minutes with the patient via telehealth technology discussing the above problems/concerns.    Jannifer Rodney, FNP

## 2020-11-23 ENCOUNTER — Other Ambulatory Visit: Payer: Self-pay

## 2020-11-23 ENCOUNTER — Encounter: Payer: Self-pay | Admitting: Physician Assistant

## 2020-11-23 ENCOUNTER — Telehealth: Payer: 59 | Admitting: Physician Assistant

## 2020-11-23 DIAGNOSIS — Z20822 Contact with and (suspected) exposure to covid-19: Secondary | ICD-10-CM

## 2020-11-23 DIAGNOSIS — U071 COVID-19: Secondary | ICD-10-CM | POA: Diagnosis not present

## 2020-11-23 MED ORDER — IPRATROPIUM-ALBUTEROL 0.5-2.5 (3) MG/3ML IN SOLN
3.0000 mL | Freq: Four times a day (QID) | RESPIRATORY_TRACT | 0 refills | Status: DC | PRN
  Filled 2020-11-23: qty 360, 30d supply, fill #0

## 2020-11-23 MED ORDER — BENZONATATE 100 MG PO CAPS
100.0000 mg | ORAL_CAPSULE | Freq: Three times a day (TID) | ORAL | 0 refills | Status: DC | PRN
  Filled 2020-11-23: qty 30, 10d supply, fill #0

## 2020-11-23 MED ORDER — FLUTICASONE PROPIONATE HFA 110 MCG/ACT IN AERO
2.0000 | INHALATION_SPRAY | Freq: Two times a day (BID) | RESPIRATORY_TRACT | 0 refills | Status: DC
  Filled 2020-11-23: qty 12, 30d supply, fill #0

## 2020-11-23 NOTE — Patient Instructions (Signed)
Hello Crystal Hammond,  You are being placed in the home monitoring program for COVID-19 (commonly known as Coronavirus).  This is because you are suspected to have the virus or are known to have the virus.  If you are unsure which group you fall into call your clinic.    As part of this program, you'll answer a daily questionnaire in the MyChart mobile app. You'll receive a notification through the MyChart app when the questionnaire is available. When you log in to MyChart, you'll see the tasks in your To Do activity.       Clinicians will see any answers that are concerning and take appropriate steps.  If at any point you are having a medical emergency, call 911.  If otherwise concerned call your clinic instead of coming into the clinic or hospital.  To keep from spreading the disease you should: Stay home and limit contact with other people as much as possible.  Wash your hands frequently. Cover your coughs and sneezes with a tissue, and throw used tissues in the trash.   Clean and disinfect frequently touched surfaces and objects.    Take care of yourself by: Staying home Resting Drinking fluids Take fever-reducing medications (Tylenol/Acetaminophen and Ibuprofen)  For more information on the disease go to the Centers for Disease Control and Prevention website     COVID-19: What to Do if You Are Sick CDC has updated isolation and quarantine recommendations for the public, and is revising the CDC website to reflect these changes. These recommendations do not apply to healthcare personnel and do not supersede state, local, tribal, or territorial laws, rules, andregulations. If you have a fever, cough or other symptoms, you might have COVID-19. Most people have mild illness and are able to recover at home. If you are sick: Keep track of your symptoms. If you have an emergency warning sign (including trouble breathing), call 911. Steps to help prevent the spread of COVID-19 if you are sick If  you are sick with COVID-19 or think you might have COVID-19, follow the steps below to care for yourself and to help protect other peoplein your home and community. Stay home except to get medical care Stay home. Most people with COVID-19 have mild illness and can recover at home without medical care. Do not leave your home, except to get medical care. Do not visit public areas. Take care of yourself. Get rest and stay hydrated. Take over-the-counter medicines, such as acetaminophen, to help you feel better. Stay in touch with your doctor. Call before you get medical care. Be sure to get care if you have trouble breathing, or have any other emergency warning signs, or if you think it is an emergency. Avoid public transportation, ride-sharing, or taxis. Separate yourself from other people As much as possible, stay in a specific room and away from other people and pets in your home. If possible, you should use a separate bathroom. If you need to be around other people or animals in oroutside of the home, wear a mask. Tell your close contactsthat they may have been exposed to COVID-19. An infected person can spread COVID-19 starting 48 hours (or 2 days) before the person has any symptoms or tests positive. By letting your close contacts know they may have been exposed to COVID-19, you are helping to protect everyone. Additional guidance is available for those living in close quarters and shared housing. See COVID-19 and Animals if you have questions about pets. If you are diagnosed with  COVID-19, someone from the health department may call you. Answer the call to slow the spread. Monitor your symptoms Symptoms of COVID-19 include fever, cough, or other symptoms. Follow care instructions from your healthcare provider and local health department. Your local health authorities may give instructions on checking your symptoms and reporting information. When to seek emergency medical attention Look for  emergency warning signs* for COVID-19. If someone is showing any of these signs, seek emergency medical care immediately: Trouble breathing Persistent pain or pressure in the chest New confusion Inability to wake or stay awake Pale, gray, or blue-colored skin, lips, or nail beds, depending on skin tone *This list is not all possible symptoms. Please call your medical provider forany other symptoms that are severe or concerning to you. Call 911 or call ahead to your local emergency facility: Notify the operator that you are seeking care for someone who has or may haveCOVID-19. Call ahead before visiting your doctor Call ahead. Many medical visits for routine care are being postponed or done by phone or telemedicine. If you have a medical appointment that cannot be postponed, call your doctor's office, and tell them you have or may have COVID-19. This will help the office protect themselves and other patients. Get tested If you have symptoms of COVID-19, get tested. While waiting for test results, you stay away from others, including staying apart from those living in your household. Self-tests are one of several options for testing for the virus that causes COVID-19 and may be more convenient than laboratory-based tests and point-of-care tests. Ask your healthcare provider or your local health department if you need help interpreting your test results. You can visit your state, tribal, local, and territorial health department's website to look for the latest local information on testing sites. If you are sick, wear a mask over your nose and mouth You should wear a mask over your nose and mouth if you must be around other people or animals, including pets (even at home). You don't need to wear the mask if you are alone. If you can't put on a mask (because of trouble breathing, for example), cover your coughs and sneezes in some other way. Try to stay at least 6 feet away from other people. This will  help protect the people around you. Masks should not be placed on young children under age 2 years, anyone who has trouble breathing, or anyone who is not able to remove the mask without help. Note: During the COVID-19 pandemic, medical grade facemasks are reserved forhealthcare workers and some first responders. Cover your coughs and sneezes Cover your mouth and nose with a tissue when you cough or sneeze. Throw away used tissues in a lined trash can. Immediately wash your hands with soap and water for at least 20 seconds. If soap and water are not available, clean your hands with an alcohol-based hand sanitizer that contains at least 60% alcohol. Clean your hands often Wash your hands often with soap and water for at least 20 seconds. This is especially important after blowing your nose, coughing, or sneezing; going to the bathroom; and before eating or preparing food. Use hand sanitizer if soap and water are not available. Use an alcohol-based hand sanitizer with at least 60% alcohol, covering all surfaces of your hands and rubbing them together until they feel dry. Soap and water are the best option, especially if hands are visibly dirty. Avoid touching your eyes, nose, and mouth with unwashed hands. Handwashing Tips Avoid sharing   personal household items Do not share dishes, drinking glasses, cups, eating utensils, towels, or bedding with other people in your home. Wash these items thoroughly after using them with soap and water or put in the dishwasher. Clean all "high-touch" surfaces every day Clean and disinfect high-touch surfaces in your "sick room" and bathroom; wear disposable gloves. Let someone else clean and disinfect surfaces in common areas, but you should clean your bedroom and bathroom, if possible. If a caregiver or other person needs to clean and disinfect a sick person's bedroom or bathroom, they should do so on an as-needed basis. The caregiver/other person should wear a mask  and disposable gloves prior to cleaning. They should wait as long as possible after the person who is sick has used the bathroom before coming in to clean and use the bathroom. High-touch surfaces include phones, remote controls, counters, tabletops, doorknobs, bathroom fixtures, toilets, keyboards, tablets, and bedside tables. Clean and disinfect areas that may have blood, stool, or body fluids on them. Use household cleaners and disinfectants. Clean the area or item with soap and water or another detergent if it is dirty. Then, use a household disinfectant. Be sure to follow the instructions on the label to ensure safe and effective use of the product. Many products recommend keeping the surface wet for several minutes to ensure germs are killed. Many also recommend precautions such as wearing gloves and making sure you have good ventilation during use of the product. Use a product from [employer] List N: Disinfectants for Coronavirus (COVID-19). Complete Disinfection Guidance When you can be around others after being sick with COVID-19 Deciding when you can be around others is different for different situations. Find out when you can safely end home isolation. For any additional questions about your care,contact your healthcare provider or state or local health department. 03/23/2020 Content source: [facility] for Immunization and Respiratory Diseases ([facility]), Division of Viral Diseases This information is not intended to replace advice given to you by your health care provider. Make sure you discuss any questions you have with your healthcare provider. Document Revised: 05/20/2020 Document Reviewed: 05/20/2020 Elsevier Patient Education  2022 Else[employer] Inc.   Can take to lessen severity: Vit C 500mg  twice daily Quercertin 250-500mg  twice daily Zinc 75-100mg  daily Melatonin 3-6 mg at bedtime Vit D3 1000-2000 IU daily Aspirin 81 mg daily with food Optional: Famotidine 20mg  daily Also can  add tylenol/ibuprofen as needed for fevers and body aches May add Mucinex Sinus Max as needed for cough/congestion  10 Things You Can Do to Manage Your COVID-19 Symptoms at Home If you have possible or confirmed COVID-19 Stay home except to get medical care. Monitor your symptoms carefully. If your symptoms get worse, call your healthcare provider immediately. Get rest and stay hydrated. If you have a medical appointment, call the healthcare provider ahead of time and tell them that you have or may have COVID-19. For medical emergencies, call 911 and notify the dispatch personnel that you have or may have COVID-19. Cover your cough and sneezes with a tissue or use the inside of your elbow. Wash your hands often with soap and water for at least 20 seconds or clean your hands with an alcohol-based hand sanitizer that contains at least 60% alcohol. As much as possible, stay in a specific room and away from other people in your home. Also, you should use a separate bathroom, if available. If you need to be around other people in or outside of the home, wear  a mask. Avoid sharing personal items with other people in your household, like dishes, towels, and bedding. Clean all surfaces that are touched often, like counters, tabletops, and doorknobs. Use household cleaning sprays or wipes according to the label instructions. [email or web address] 10/30/2019 This information is not intended to replace advice given to you by your health care provider. Make sure you discuss any questions you have with your healthcare provider. Document Revised: 05/20/2020 Document Reviewed: 05/20/2020 Elsevier Patient Education  2022 [employer].

## 2020-11-23 NOTE — Progress Notes (Signed)
Virtual Visit Consent   Crystal Hammond, you are scheduled for a virtual visit with a [facility] provider today.     Just as with appointments in the office, your consent must be obtained to participate.  Your consent will be active for this visit and any virtual visit you may have with one of our providers in the next 365 days.     If you have a MyChart account, a copy of this consent can be sent to you electronically.  All virtual visits are billed to your insurance company just like a traditional visit in the office.    As this is a virtual visit, video technology does not allow for your provider to perform a traditional examination.  This may limit your provider's ability to fully assess your condition.  If your provider identifies any concerns that need to be evaluated in person or the need to arrange testing (such as labs, EKG, etc.), we will make arrangements to do so.     Although advances in technology are sophisticated, we cannot ensure that it will always work on either your end or our end.  If the connection with a video visit is poor, the visit may have to be switched to a telephone visit.  With either a video or telephone visit, we are not always able to ensure that we have a secure connection.     I need to obtain your verbal consent now.   Are you willing to proceed with your visit today?    Crystal Hammond has provided verbal consent on 11/23/2020 for a virtual visit (video or telephone).   Margaretann Loveless, PA-C   Date: 11/23/2020 3:28 PM   Virtual Visit via Video Note   I, Margaretann Loveless, connected with  Crystal Hammond  ([id number], 1980/07/08) on 11/23/20 at  2:15 PM EDT by a video-enabled telemedicine application and verified that I am speaking with the correct person using two identifiers.  Location: Patient: Virtual Visit Location Patient: Home Provider: Virtual Visit Location Provider: Home Office   I discussed the limitations of evaluation and  management by telemedicine and the availability of in person appointments. The patient expressed understanding and agreed to proceed.    History of Present Illness: Crystal Hammond is a 40 y.o. who identifies as a female who was assigned female at birth, and is being seen today for covid 46.  HPI: URI  This is a new problem. Episode onset: symptoms started yesterday, tested positive for covid 19 last night. The problem has been gradually worsening. The maximum temperature recorded prior to her arrival was 100.4 - 100.9 F. The fever has been present for Less than 1 day. Associated symptoms include chest pain, congestion, headaches, joint pain, nausea, sinus pain, a sore throat and vomiting. Pertinent negatives include no coughing, diarrhea, rhinorrhea or sneezing. Associated symptoms comments: Loss of taste and smell, body aches, chills, myalgias, post nasal drainage. Treatments tried: ibuprofen, tylenol, albuterol nebulizer. The treatment provided mild relief.    Is recovering from bronchitis she had the last week of July 2022.  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   Itching 07/22/2020   Acute bronchitis, viral 05/25/2020   Congestion of paranasal sinus 05/25/2020   Vitamin D deficiency 01/26/2020   Incisional hernia 12/11/2019   Acute bronchitis with bronchospasm 09/18/2019   Prediabetes 07/26/2019   GAD (generalized anxiety disorder) 06/25/2019   Adult ADHD 06/25/2019  Depressive disorder 06/25/2019   Diet controlled gestational diabetes mellitus (GDM) in third trimester 05/27/2017   Family history of congenital anomalies 05/27/2017   Herpes 05/27/2017   Hx of cesarean section 05/27/2017   Migraine without aura, not intractable 05/27/2017   Agoraphobia 05/30/2016   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   History of abnormal cervical Pap smear 01/31/2016   History of kidney stones 01/31/2016    Allergies:  Allergies   Allergen Reactions   Other Swelling    SHELLFISH    Phenazopyridine Hives and Rash   Benadryl [Diphenhydramine]    Pyridium [Phenazopyridine Hcl]    Medications:  Current Outpatient Medications:    fluticasone (FLOVENT HFA) 110 MCG/ACT inhaler, Inhale 2 puffs into the lungs 2 (two) times daily., Disp: 12 g, Rfl: 0   ipratropium-albuterol (DUONEB) 0.5-2.5 (3) MG/3ML SOLN, Take 3 mLs by nebulization every 6 (six) hours as needed., Disp: 360 mL, Rfl: 0   albuterol (VENTOLIN HFA) 108 (90 Base) MCG/ACT inhaler, Inhale 2 puffs into the lungs every 6 (six) hours as needed for wheezing or shortness of breath., Disp: 18 g, Rfl: 0   amphetamine-dextroamphetamine (ADDERALL XR) 30 MG 24 hr capsule, Take 1 capsule (30 mg total) by mouth every morning., Disp: 30 capsule, Rfl: 0   amphetamine-dextroamphetamine (ADDERALL XR) 30 MG 24 hr capsule, Take 1 capsule (30 mg total) by mouth every morning., Disp: 30 capsule, Rfl: 0   amphetamine-dextroamphetamine (ADDERALL XR) 30 MG 24 hr capsule, Take 1 capsule (30 mg total) by mouth every morning., Disp: 30 capsule, Rfl: 0   amphetamine-dextroamphetamine (ADDERALL XR) 30 MG 24 hr capsule, TAKE 1 CAPSULE BY MOUTH ONCE DAILY EVERY MORNING., Disp: 30 capsule, Rfl: 0   amphetamine-dextroamphetamine (ADDERALL XR) 30 MG 24 hr capsule, TAKE 1 CAPSULE BY MOUTH ONCE DAILY EVERY MORNING. FILL 07/24/20, Disp: 30 capsule, Rfl: 0   amphetamine-dextroamphetamine (ADDERALL XR) 30 MG 24 hr capsule, TAKE 1 CAPSULE BY MOUTH DAILY EVERY MORNING. FILL 08/23/20, Disp: 30 capsule, Rfl: 0   amphetamine-dextroamphetamine (ADDERALL) 10 MG tablet, Take 1 tablet (10 mg total) by mouth daily at 4 PM., Disp: 30 tablet, Rfl: 0   amphetamine-dextroamphetamine (ADDERALL) 10 MG tablet, Take 1 tablet (10 mg total) by mouth daily at 4 PM., Disp: 30 tablet, Rfl: 0   amphetamine-dextroamphetamine (ADDERALL) 10 MG tablet, Take 1 tablet (10 mg total) by mouth daily at 4 PM., Disp: 30 tablet, Rfl: 0    amphetamine-dextroamphetamine (ADDERALL) 10 MG tablet, TAKE 1 TABLET BY MOUTH DAILY AT 4 PM., Disp: 30 tablet, Rfl: 0   amphetamine-dextroamphetamine (ADDERALL) 10 MG tablet, TAKE 1 TABLET BY MOUTH DAILY AT 4 PM., Disp: 30 tablet, Rfl: 0   amphetamine-dextroamphetamine (ADDERALL) 10 MG tablet, TAKE 1 TABLET BY MOUTH DAILY AT 4 PM. FILL 08/23/20, Disp: 30 tablet, Rfl: 0   benzonatate (TESSALON) 100 MG capsule, Take 1 capsule (100 mg total) by mouth 3 (three) times daily as needed., Disp: 30 capsule, Rfl: 0   hydrOXYzine (ATARAX/VISTARIL) 10 MG tablet, Take 1 tablet (10 mg total) by mouth 3 (three) times daily as needed., Disp: 30 tablet, Rfl: 0   loratadine (CLARITIN) 10 MG tablet, TAKE 1 TABLET (10 MG TOTAL) BY MOUTH DAILY., Disp: 90 tablet, Rfl: 0   mometasone (ELOCON) 0.1 % lotion, APPLY TO SCALP LET SIT OVERNIGHT & RINSE OUT IN THE AM, USE AT NIGHT X2WKS DECREASE TO 2 TIMES PER WEEK UNTIL CLEAR, THEN AS NEEDED FOR FLARES, Disp: 60 mL, Rfl: 2   sodium chloride (OCEAN)  0.65 % nasal spray, PLACE 1 SPRAY INTO THE NOSE AS NEEDED FOR CONGESTION., Disp: 60 mL, Rfl: 0  Observations/Objective: Patient is well-developed, well-nourished in no acute distress.  Resting comfortably at home.  Head is normocephalic, atraumatic.  No labored breathing.  Speech is clear and coherent with logical content.  Patient is alert and oriented at baseline.    Assessment and Plan: 1. COVID-19 - MyChart COVID-19 home monitoring program; Future - fluticasone (FLOVENT HFA) 110 MCG/ACT inhaler; Inhale 2 puffs into the lungs 2 (two) times daily.  Dispense: 12 g; Refill: 0 - ipratropium-albuterol (DUONEB) 0.5-2.5 (3) MG/3ML SOLN; Take 3 mLs by nebulization every 6 (six) hours as needed.  Dispense: 360 mL; Refill: 0  2. Suspected COVID-19 virus infection - benzonatate (TESSALON) 100 MG capsule; Take 1 capsule (100 mg total) by mouth 3 (three) times daily as needed.  Dispense: 30 capsule; Refill: 0 - Continue OTC symptomatic  management of choice - Will send OTC vitamins and supplement information through AVS - Flovent inhaler, duoneb for her nebulizer, and tessalon perles for her cough were prescribed - Patient enrolled in MyChart symptom monitoring - Push fluids - Rest as needed - Discussed return precautions and when to seek in-person evaluation, sent via AVS as well  Follow Up Instructions: I discussed the assessment and treatment plan with the patient. The patient was provided an opportunity to ask questions and all were answered. The patient agreed with the plan and demonstrated an understanding of the instructions.  A copy of instructions were sent to the patient via MyChart.  The patient was advised to call back or seek an in-person evaluation if the symptoms worsen or if the condition fails to improve as anticipated.  Time:  I spent 16 minutes with the patient via telehealth technology discussing the above problems/concerns.    Margaretann Loveless, PA-C

## 2020-11-24 ENCOUNTER — Telehealth: Payer: Self-pay

## 2020-11-24 ENCOUNTER — Encounter (INDEPENDENT_AMBULATORY_CARE_PROVIDER_SITE_OTHER): Payer: Self-pay

## 2020-11-24 NOTE — Telephone Encounter (Signed)
Patient states that she vomited this morning once and that she has a cough. Patient given Tessalon Pearls for cough. Patient was advise on worsening symptoms per protocol as follows:    If cough remains the same or better: continue to treat with over the counter medications. Hard candy or cough drops and drinking warm fluids. Adults can also use honey 2 tsp (10 ML) at bedtime.   HONEY IS NOT RECOMMENDED FOR INFANTS UNDER ONE.   If cough is becoming worse even with the use of over the counter medications and patient is not able to sleep at night, cough becomes productive with sputum that maybe yellow or green in color, contact PCP.   If appetite becomes worse: encourage patient to drink fluids as tolerated, work their way up to bland solid food such as crackers, pretzels, soup, bread or applesauce and boiled starches.   If patient is unable to tolerate any foods or liquids, notify PCP.   IF PATIENT DEVELOPS SEVERE VOMITING (MORE THAN 6 TIMES A DAY AND OR >8 HOURS) AND/OR SEVERE ABDOMINAL PAIN ADVISE PATIENT TO CALL 911 AND SEEK TREATMENT IN ED

## 2020-11-25 ENCOUNTER — Telehealth: Payer: Self-pay

## 2020-11-25 NOTE — Telephone Encounter (Signed)
Attempted to contact patient regarding worsening symptoms. No answer and vm is full. Sent a Clinical cytogeneticist message with information on worsening symptoms.

## 2020-11-29 ENCOUNTER — Telehealth: Payer: Self-pay

## 2020-11-29 ENCOUNTER — Encounter (INDEPENDENT_AMBULATORY_CARE_PROVIDER_SITE_OTHER): Payer: Self-pay

## 2020-11-29 NOTE — Telephone Encounter (Signed)
Spoke with patient and she states that she vomited once this morning. Patient took some Zofran. Patient temp was 100.9 and she will continue to monitor to see if she needs to take any anti fever medication. Patient was advise on symptoms per protocols below.    IF PATIENT DEVELOPS SEVERE VOMITING (MORE THAN 6 TIMES A DAY AND OR >8 HOURS) AND/OR SEVERE ABDOMINAL PAIN ADVISE PATIENT TO CALL 911 AND SEEK TREATMENT IN ED   Temperature is the same and is noted to be less than 100.4: Continue to monitor at home.   Advise patient to stay hydrated, push oral fluids if able, and advise pt. to avoid using multiple blankets and layer of clothing to prevent overheating.   Avoid over use of anti-pyretic medications for fevers less than 101 degrees Fahrenheit.   Fever helps fight infection.   Worsening temperature, treat if temperature is > 101: treat with OTC anti-fever medications (Tylenol and/or Ibuprofen)   May alternate Tylenol and Ibuprofen every 3 hours as needed for fever greater than 101. Example: Tylenol at 9AM, Ibuprofen at 12PM, Tylenol at 3PM, Ibuprofen at 6PM, Tylenol at 9PM, Ibuprofen at 12AM, Tylenol at 3AM, Ibuprofen at 6AM. Then restart rotation with Tylenol at 9AM.   If fever remains for greater than 3 days, notify PCP.   If fever becomes greater than 103 and unable to reduce with over the counter medication, contact PCP.

## 2020-11-30 ENCOUNTER — Encounter: Payer: Self-pay | Admitting: Internal Medicine

## 2020-11-30 ENCOUNTER — Telehealth (INDEPENDENT_AMBULATORY_CARE_PROVIDER_SITE_OTHER): Payer: 59 | Admitting: Internal Medicine

## 2020-11-30 ENCOUNTER — Other Ambulatory Visit: Payer: Self-pay

## 2020-11-30 DIAGNOSIS — R509 Fever, unspecified: Secondary | ICD-10-CM | POA: Diagnosis not present

## 2020-11-30 DIAGNOSIS — R0602 Shortness of breath: Secondary | ICD-10-CM | POA: Diagnosis not present

## 2020-11-30 DIAGNOSIS — Z03818 Encounter for observation for suspected exposure to other biological agents ruled out: Secondary | ICD-10-CM | POA: Diagnosis not present

## 2020-11-30 DIAGNOSIS — J22 Unspecified acute lower respiratory infection: Secondary | ICD-10-CM | POA: Diagnosis not present

## 2020-11-30 DIAGNOSIS — U071 COVID-19: Secondary | ICD-10-CM | POA: Insufficient documentation

## 2020-11-30 DIAGNOSIS — R6883 Chills (without fever): Secondary | ICD-10-CM | POA: Diagnosis not present

## 2020-11-30 MED ORDER — PREDNISONE 20 MG PO TABS
ORAL_TABLET | ORAL | 0 refills | Status: DC
  Filled 2020-11-30: qty 10, 5d supply, fill #0

## 2020-11-30 MED ORDER — AZITHROMYCIN 250 MG PO TABS
ORAL_TABLET | ORAL | 0 refills | Status: DC
  Filled 2020-11-30: qty 6, 5d supply, fill #0

## 2020-11-30 NOTE — Assessment & Plan Note (Signed)
Had improved at first but now worsened. She doesn't look good I am concerned about worsening of the primary COVID infection (though that is less common with omicron) or a secondary pneumonia. Despite the water intake, she could have some dehydration as well I recommended ER evaluation today to make definitive diagnosis and start appropriate Rx

## 2020-11-30 NOTE — Progress Notes (Signed)
Subjective:    Patient ID: Crystal Hammond, female    DOB: 07-28-1980, 40 y.o.   MRN: [id number]  HPI Video virtual visit due to persistent respiratory symptoms Identification done Reviewed limitations and billing and she gave consent Participants---patient and husband (who gives most of the history) in her home and I am in my office  COVID diagnosed 1 week ago Still feeling bad Had been feeling some better--then worsened 3-4 days ago Ongoing chest symptoms---burning, SOB Oximetry 88% at times  Very weak, fever, headache Feels cold and has shakes  Very little oral intake---like some dry cereal Some water--plenty  Current Outpatient Medications on File Prior to Visit  Medication Sig Dispense Refill   albuterol (VENTOLIN HFA) 108 (90 Base) MCG/ACT inhaler Inhale 2 puffs into the lungs every 6 (six) hours as needed for wheezing or shortness of breath. 18 g 0   amphetamine-dextroamphetamine (ADDERALL XR) 30 MG 24 hr capsule Take 1 capsule (30 mg total) by mouth every morning. 30 capsule 0   amphetamine-dextroamphetamine (ADDERALL XR) 30 MG 24 hr capsule Take 1 capsule (30 mg total) by mouth every morning. 30 capsule 0   amphetamine-dextroamphetamine (ADDERALL XR) 30 MG 24 hr capsule Take 1 capsule (30 mg total) by mouth every morning. 30 capsule 0   amphetamine-dextroamphetamine (ADDERALL XR) 30 MG 24 hr capsule TAKE 1 CAPSULE BY MOUTH ONCE DAILY EVERY MORNING. 30 capsule 0   amphetamine-dextroamphetamine (ADDERALL XR) 30 MG 24 hr capsule TAKE 1 CAPSULE BY MOUTH ONCE DAILY EVERY MORNING. FILL 07/24/20 30 capsule 0   amphetamine-dextroamphetamine (ADDERALL XR) 30 MG 24 hr capsule TAKE 1 CAPSULE BY MOUTH DAILY EVERY MORNING. FILL 08/23/20 30 capsule 0   amphetamine-dextroamphetamine (ADDERALL) 10 MG tablet Take 1 tablet (10 mg total) by mouth daily at 4 PM. 30 tablet 0   amphetamine-dextroamphetamine (ADDERALL) 10 MG tablet Take 1 tablet (10 mg total) by mouth daily at 4 PM. 30 tablet 0    amphetamine-dextroamphetamine (ADDERALL) 10 MG tablet Take 1 tablet (10 mg total) by mouth daily at 4 PM. 30 tablet 0   amphetamine-dextroamphetamine (ADDERALL) 10 MG tablet TAKE 1 TABLET BY MOUTH DAILY AT 4 PM. 30 tablet 0   amphetamine-dextroamphetamine (ADDERALL) 10 MG tablet TAKE 1 TABLET BY MOUTH DAILY AT 4 PM. 30 tablet 0   amphetamine-dextroamphetamine (ADDERALL) 10 MG tablet TAKE 1 TABLET BY MOUTH DAILY AT 4 PM. FILL 08/23/20 30 tablet 0   benzonatate (TESSALON) 100 MG capsule Take 1 capsule (100 mg total) by mouth 3 (three) times daily as needed. 30 capsule 0   fluticasone (FLOVENT HFA) 110 MCG/ACT inhaler Inhale 2 puffs into the lungs 2 (two) times daily. 12 g 0   hydrOXYzine (ATARAX/VISTARIL) 10 MG tablet Take 1 tablet (10 mg total) by mouth 3 (three) times daily as needed. 30 tablet 0   ipratropium-albuterol (DUONEB) 0.5-2.5 (3) MG/3ML SOLN Take 3 mLs by nebulization every 6 (six) hours as needed. 360 mL 0   loratadine (CLARITIN) 10 MG tablet TAKE 1 TABLET (10 MG TOTAL) BY MOUTH DAILY. 90 tablet 0   mometasone (ELOCON) 0.1 % lotion APPLY TO SCALP LET SIT OVERNIGHT & RINSE OUT IN THE AM, USE AT NIGHT X2WKS DECREASE TO 2 TIMES PER WEEK UNTIL CLEAR, THEN AS NEEDED FOR FLARES 60 mL 2   sodium chloride (OCEAN) 0.65 % nasal spray PLACE 1 SPRAY INTO THE NOSE AS NEEDED FOR CONGESTION. 60 mL 0   No current facility-administered medications on file prior to visit.  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)   Heart murmur    OUTGREW   History of febrile seizure    History of kidney stones    History of pneumonia    Migraines    OCD (obsessive compulsive disorder)    Oral herpes     Pneumonia    PONV (postoperative nausea and vomiting)    Postpartum depression    Pre-diabetes    PTSD (post-traumatic stress disorder)    Recurrent UTI     Past Surgical History:  Procedure Laterality Date   ABDOMINAL SURGERY  2012   Endometrial Surgery - went through c-section scar   CESAREAN SECTION  2019   CESAREAN SECTION  2007   CESAREAN SECTION  2014   CESAREAN SECTION  2016   GASTROSCHISIS CLOSURE     LEEP     x 2   VENTRAL HERNIA REPAIR N/A 12/11/2019   Procedure: HERNIA REPAIR VENTRAL ADULT;  Surgeon: Carolan Shiver, MD;  Location: [facility] ORS;  Service: General;  Laterality: N/A;   WISDOM TOOTH EXTRACTION     XI ROBOTIC ASSISTED VENTRAL HERNIA N/A 12/11/2019   Procedure: XI ROBOTIC ASSISTED VENTRAL HERNIA Lap vs open;  Surgeon: Carolan Shiver, MD;  Location: [facility] ORS;  Service: General;  Laterality: N/A;    Family History  Problem Relation Age of Onset   Depression Mother    Alcohol abuse Mother    Depression Father    Alcohol abuse Father    Bipolar disorder Brother    Drug abuse Brother    ADD / ADHD Brother    ADD / ADHD Son    ADD / ADHD Son    Breast cancer Neg Hx    Ovarian cancer Neg Hx     Social History   Socioeconomic History   Marital status: Married    Spouse name: Not on file   Number of children: 4   Years of education: Not on file   Highest education level: Not on file  Occupational History   Not on file  Tobacco Use   Smoking status: Former    Packs/day: 0.30    Years: 3.00    Pack years: 0.90    Types: Cigarettes    Quit date: 2013    Years since quitting: 9.6   Smokeless tobacco: Never  Vaping Use   Vaping Use: Never used  Substance and Sexual Activity   Alcohol use: Yes    Alcohol/week: 2.0 standard drinks    Types: 2 Cans of beer per week   Drug use: Never   Sexual activity: Yes    Birth control/protection: None  Other Topics Concern   Not on file  Social History Narrative   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 Three sons (13, 6, 4) and one daughter (1 yo). Second oldest has autism spectrum disorder and two oldest ADHD.   Social Determinants of Health   Financial Resource Strain: Not on file  Food Insecurity: Not on file  Transportation Needs: Not on file  Physical Activity: Not on file  Stress: Not on  file  Social Connections: Not on file  Intimate Partner Violence: Not on file   Review of Systems Some abdominal pain---general No chest pain other than the burning sensation    Objective:   Physical Exam Constitutional:      Comments: Appears ill--covered in bed and only participates with brief answers  Pulmonary:     Comments: No clear respiratory distress          Assessment & Plan:

## 2020-12-06 ENCOUNTER — Other Ambulatory Visit: Payer: Self-pay

## 2020-12-06 ENCOUNTER — Ambulatory Visit
Admission: RE | Admit: 2020-12-06 | Discharge: 2020-12-06 | Disposition: A | Discharge status: Home / Self Care | Payer: 59 | Source: Ambulatory Visit | Attending: Family Medicine | Admitting: Family Medicine

## 2020-12-06 ENCOUNTER — Ambulatory Visit
Admission: RE | Admit: 2020-12-06 | Discharge: 2020-12-06 | Disposition: A | Discharge status: Home / Self Care | Payer: 59 | Attending: Family Medicine | Admitting: Family Medicine

## 2020-12-06 ENCOUNTER — Ambulatory Visit (INDEPENDENT_AMBULATORY_CARE_PROVIDER_SITE_OTHER): Payer: 59 | Admitting: Family Medicine

## 2020-12-06 ENCOUNTER — Encounter: Payer: Self-pay | Admitting: Family Medicine

## 2020-12-06 VITALS — BP 118/70 | HR 105 | Temp 98.0°F | Wt 176.0 lb

## 2020-12-06 DIAGNOSIS — R059 Cough, unspecified: Secondary | ICD-10-CM | POA: Diagnosis not present

## 2020-12-06 DIAGNOSIS — J189 Pneumonia, unspecified organism: Secondary | ICD-10-CM | POA: Diagnosis not present

## 2020-12-06 DIAGNOSIS — I517 Cardiomegaly: Secondary | ICD-10-CM | POA: Diagnosis not present

## 2020-12-06 LAB — BASIC METABOLIC PANEL
BUN: 7 mg/dL (ref 6–23)
CO2: 31 mEq/L (ref 19–32)
Calcium: 9.4 mg/dL (ref 8.4–10.5)
Chloride: 98 mEq/L (ref 96–112)
Creatinine, Ser: 0.73 mg/dL (ref 0.40–1.20)
GFR: 104.25 mL/min (ref >60.00)
Glucose, Bld: 69 mg/dL — ABNORMAL LOW (ref 70–99)
Potassium: 3.6 mEq/L (ref 3.5–5.1)
Sodium: 136 mEq/L (ref 135–145)

## 2020-12-06 LAB — CBC WITH DIFFERENTIAL/PLATELET
Basophils Absolute: 0 10*3/uL (ref 0.0–0.1)
Basophils Relative: 0.2 % (ref 0.0–3.0)
Eosinophils Absolute: 0.3 10*3/uL (ref 0.0–0.7)
Eosinophils Relative: 1.7 % (ref 0.0–5.0)
HCT: 36.8 % (ref 36.0–46.0)
Hemoglobin: 12.2 g/dL (ref 12.0–15.0)
Lymphocytes Relative: 18.6 % (ref 12.0–46.0)
Lymphs Abs: 3 10*3/uL (ref 0.7–4.0)
MCHC: 33.3 g/dL (ref 30.0–36.0)
MCV: 81.1 fl (ref 78.0–100.0)
Monocytes Absolute: 0.6 10*3/uL (ref 0.1–1.0)
Monocytes Relative: 3.8 % (ref 3.0–12.0)
Neutro Abs: 12.3 10*3/uL — ABNORMAL HIGH (ref 1.4–7.7)
Neutrophils Relative %: 75.7 % (ref 43.0–77.0)
Platelets: 386 10*3/uL (ref 150.0–400.0)
RBC: 4.53 Mil/uL (ref 3.87–5.11)
RDW: 14.4 % (ref 11.5–15.5)
WBC: 16.2 10*3/uL — ABNORMAL HIGH (ref 4.0–10.5)

## 2020-12-06 MED ORDER — DOXYCYCLINE HYCLATE 100 MG PO TABS
100.0000 mg | ORAL_TABLET | Freq: Two times a day (BID) | ORAL | 0 refills | Status: DC
  Filled 2020-12-06: qty 14, 7d supply, fill #0

## 2020-12-06 MED ORDER — HYDROCODONE BIT-HOMATROP MBR 5-1.5 MG/5ML PO SOLN
5.0000 mL | Freq: Three times a day (TID) | ORAL | 0 refills | Status: DC | PRN
  Filled 2020-12-06: qty 75, 5d supply, fill #0

## 2020-12-06 MED ORDER — AMOXICILLIN-POT CLAVULANATE 875-125 MG PO TABS
1.0000 | ORAL_TABLET | Freq: Two times a day (BID) | ORAL | 0 refills | Status: DC
  Filled 2020-12-06: qty 20, 10d supply, fill #0

## 2020-12-06 NOTE — Progress Notes (Signed)
This visit occurred during the SARS-CoV-2 public health emergency.  Safety protocols were in place, including screening questions prior to the visit, additional usage of staff PPE, and extensive cleaning of exam room while observing appropriate contact time as indicated for disinfecting solutions.  Had covid, tested positive on 11/22/20.  Then to UC on 11/30/20.  Dx/d with lower rsp infection with azithro tx. she has completed antibiotics in the meantime.  Still on nebs and inhalers at baseline.  She had trouble tolerating prednisone- her skin got hot.    Now with fever last night, 102.8.  Inc in heart rate noted by patient.  Cough got worse yesterday evening.  Sputum is brownish/occ streaked with blood.  Tessalon didn't help.  Albuterol helps some.    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  H/o PNA noted.  Has been out of work recently.  Meds, vitals, and allergies reviewed.   ROS: Per HPI unless specifically indicated in ROS section   GEN: nad, alert and oriented HEENT: ncat NECK: supple w/o LA CV: rrr.   PULM: ctab, no inc wob except for slightly dec BS BLL EXT: no edema SKIN: no acute rash, skin well perfused.

## 2020-12-06 NOTE — Patient Instructions (Addendum)
Go to the lab on the way out.   If you have mychart we'll likely use that to update you.    CXR at [address] near [facility].  Rest and fluids.   If worse, to ER.  Start antibiotics today.  Take care.  Glad to see you. Out of work for now.   Use hydrocodone if the inhalers don't help with the cough.  Sedation caution.

## 2020-12-07 DIAGNOSIS — R059 Cough, unspecified: Secondary | ICD-10-CM | POA: Insufficient documentation

## 2020-12-07 NOTE — Assessment & Plan Note (Signed)
Concern for pneumonia.  Still okay for outpatient follow-up.  ER cautions discussed with patient. See notes on labs.  Order placed for chest x-ray at [address] near [facility].  See notes on x-ray report. Rest and fluids in the meantime. If worse, to ER.  Start Augmentin and doxycycline today.  Out of work for now.  Use hydrocodone if her inhalers don't help with the cough.  Sedation caution.  She agrees with plan.

## 2020-12-08 ENCOUNTER — Emergency Department (HOSPITAL_COMMUNITY)
Admission: EM | Admit: 2020-12-08 | Discharge: 2020-12-08 | Disposition: A | Discharge status: Home / Self Care | Payer: 59 | Attending: Emergency Medicine | Admitting: Emergency Medicine

## 2020-12-08 ENCOUNTER — Telehealth: Payer: Self-pay

## 2020-12-08 DIAGNOSIS — Z87891 Personal history of nicotine dependence: Secondary | ICD-10-CM | POA: Diagnosis not present

## 2020-12-08 DIAGNOSIS — R059 Cough, unspecified: Secondary | ICD-10-CM | POA: Diagnosis present

## 2020-12-08 DIAGNOSIS — R9431 Abnormal electrocardiogram [ECG] [EKG]: Secondary | ICD-10-CM | POA: Diagnosis not present

## 2020-12-08 DIAGNOSIS — J189 Pneumonia, unspecified organism: Secondary | ICD-10-CM | POA: Insufficient documentation

## 2020-12-08 DIAGNOSIS — Z8616 Personal history of COVID-19: Secondary | ICD-10-CM | POA: Insufficient documentation

## 2020-12-08 LAB — CBC WITH DIFFERENTIAL/PLATELET
Abs Immature Granulocytes: 0.39 10*3/uL — ABNORMAL HIGH (ref 0.00–0.07)
Basophils Absolute: 0.1 10*3/uL (ref 0.0–0.1)
Basophils Relative: 1 %
Eosinophils Absolute: 0.3 10*3/uL (ref 0.0–0.5)
Eosinophils Relative: 2 %
HCT: 37.6 % (ref 36.0–46.0)
Hemoglobin: 12.1 g/dL (ref 12.0–15.0)
Immature Granulocytes: 3 %
Lymphocytes Relative: 25 %
Lymphs Abs: 3.7 10*3/uL (ref 0.7–4.0)
MCH: 26.8 pg (ref 26.0–34.0)
MCHC: 32.2 g/dL (ref 30.0–36.0)
MCV: 83.4 fL (ref 80.0–100.0)
Monocytes Absolute: 0.6 10*3/uL (ref 0.1–1.0)
Monocytes Relative: 4 %
Neutro Abs: 9.6 10*3/uL — ABNORMAL HIGH (ref 1.7–7.7)
Neutrophils Relative %: 65 %
Platelets: 366 10*3/uL (ref 150–400)
RBC: 4.51 MIL/uL (ref 3.87–5.11)
RDW: 13.4 % (ref 11.5–15.5)
WBC: 14.7 10*3/uL — ABNORMAL HIGH (ref 4.0–10.5)
nRBC: 0 % (ref 0.0–0.2)

## 2020-12-08 LAB — BASIC METABOLIC PANEL
Anion gap: 10 (ref 5–15)
BUN: 7 mg/dL (ref 6–20)
CO2: 26 mmol/L (ref 22–32)
Calcium: 9.8 mg/dL (ref 8.9–10.3)
Chloride: 100 mmol/L (ref 98–111)
Creatinine, Ser: 0.65 mg/dL (ref 0.44–1.00)
GFR, Estimated: >60 mL/min (ref >60)
Glucose, Bld: 102 mg/dL — ABNORMAL HIGH (ref 70–99)
Potassium: 3.8 mmol/L (ref 3.5–5.1)
Sodium: 136 mmol/L (ref 135–145)

## 2020-12-08 LAB — I-STAT BETA HCG BLOOD, ED (MC, WL, AP ONLY): I-stat hCG, quantitative: <5 m[IU]/mL (ref <5)

## 2020-12-08 NOTE — Discharge Instructions (Addendum)
Your lungs sounded clear on exam today.  I believe that your chest pain is coming from a combination of costochondritis and pleurisy.  I would take Tylenol and ibuprofen for this.  I do not believe that you have a blood clot in your lungs.  You are on an excellent antibiotic regimen that should treat almost all pneumonias.  Your white count is trending down, suggesting that it is likely working.  Unfortunately even after an infection is clear, information can remain and he can keep you coughing for weeks after an infection has resolved.  If your symptoms continue, follow-up with your primary care doctor as you may need pulmonary function testing that we cannot do here in the emergency department.  However, I do not believe that she needed a CT scan today.  Your oxygen saturation was normal on her pulse oximeter is here even with ambulation.  Many times a home pulse oximeter is do not have a waveform and therefore when your pulse changes, the oxygen number can change although does not truly reflect how well your body is getting oxygen.  Please do not hesitate to return to the emergency department if your symptoms significantly change or worsen.

## 2020-12-08 NOTE — ED Triage Notes (Signed)
Pt 8/9 C+, 8/17 was febrile & dx w pneumonia at [facility], sent home w script of prednisone & abx.Tuesday, saw PCP & sent home w different abx- augmentin. Pt states that when lying flat, O2 goes to 80's. Spitting up brown phlegm. Vaccinated against covid Hx pneumonia

## 2020-12-08 NOTE — ED Notes (Addendum)
Pt ambulated while checking pulse oximetry, pt oxygen started at 97 sitting down, went up to 100 while ambulating and stayed at 100 while ambulating. Pt only reported pain when breathing while ambulating, no SOB or dizziness.

## 2020-12-08 NOTE — ED Provider Notes (Signed)
[facility] EMERGENCY [facility]MENT Provider Note   CSN: [id number] Arrival date & time: 12/08/20  1511     History Chief Complaint  Patient presents with   Pneumonia    Crystal Hammond is a 40 y.o. female.   Pneumonia Pertinent negatives include no chest pain, no abdominal pain and no shortness of breath.  40 year old female with a past medical history of obesity presenting to the emergency department with cough.  Patient reports that she was diagnosed with COVID-19 on 11/22/2020.  She states that she has had persistent fatigue since that time but her cough and fever resolved.  She states on 8/17, she was feeling bad again so she went to an urgent care and they diagnosed her with pneumonia on a chest x-ray prescribed her azithromycin.  She states that she had "good days and bad days" with that on 8/23 she was experiencing cough and became febrile again up to a T-max of 102 F so she went to her primary care doctor.  She states that a chest x-ray was showed that her lungs were clear at that time, but her primary care doctor put her on Augmentin and doxycycline which she has been adherent to since that time.  She states that she was called by her primary care doctor today and asked that she was feeling.  She states that she told her she is feeling still pretty poor, so they recommend she come to the ED for evaluation.  She states that her cough is productive with brown sputum several days ago was tinged with blood, but no longer is.  She states that she has bilateral peristernal chest pain, especially when she coughs.  She denies any significant shortness of breath.  She states that she is most concerned because at home pulse oximeter showed that her oxygen saturations were 80% when she laid flat.  She denies any history of PE or DVT.  She denies recent surgeries.  She denies any leg swelling.  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)   Heart murmur    OUTGREW   History of febrile seizure    History of kidney stones    History of pneumonia    Migraines    OCD (obsessive compulsive disorder)    Oral herpes    Pneumonia    PONV (postoperative nausea and vomiting)    Postpartum depression    Pre-diabetes    PTSD (post-traumatic stress disorder)    Recurrent UTI     Patient Active Problem List   Diagnosis Date Noted   Cough 12/07/2020   COVID-19 virus infection 11/30/2020   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   Itching 07/22/2020   Acute bronchitis, viral 05/25/2020   Congestion of paranasal sinus 05/25/2020   Vitamin D deficiency 01/26/2020   Incisional hernia 12/11/2019   Acute bronchitis with bronchospasm 09/18/2019   Prediabetes 07/26/2019   GAD (generalized anxiety disorder) 06/25/2019   Adult ADHD 06/25/2019   Depressive disorder 06/25/2019   Diet controlled gestational diabetes mellitus (GDM) in third trimester 05/27/2017   Family history of congenital anomalies 05/27/2017   Herpes 05/27/2017   Hx of cesarean section 05/27/2017   Migraine without aura, not intractable 05/27/2017   Agoraphobia 05/30/2016   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   History of abnormal cervical  Pap smear 01/31/2016   History of kidney stones 01/31/2016    Past Surgical History:  Procedure Laterality Date   ABDOMINAL SURGERY  2012   Endometrial Surgery - went through c-section scar   CESAREAN SECTION  2019   CESAREAN SECTION  2007   CESAREAN SECTION  2014   CESAREAN SECTION  2016   GASTROSCHISIS CLOSURE     LEEP     x 2   VENTRAL HERNIA REPAIR N/A 12/11/2019   Procedure: HERNIA REPAIR VENTRAL ADULT;  Surgeon: Carolan Shiver, MD;  Location: [facility] ORS;  Service: General;  Laterality: N/A;   WISDOM TOOTH EXTRACTION     XI ROBOTIC ASSISTED VENTRAL HERNIA N/A 12/11/2019   Procedure: XI ROBOTIC ASSISTED VENTRAL  HERNIA Lap vs open;  Surgeon: Carolan Shiver, MD;  Location: [facility] ORS;  Service: General;  Laterality: N/A;     OB History     Gravida  5   Para  4   Term  4   Preterm      AB  1   Living  4      SAB  1   IAB      Ectopic      Multiple      Live Births  4           Family History  Problem Relation Age of Onset   Depression Mother    Alcohol abuse Mother    Depression Father    Alcohol abuse Father    Bipolar disorder Brother    Drug abuse Brother    ADD / ADHD Brother    ADD / ADHD Son    ADD / ADHD Son    Breast cancer Neg Hx    Ovarian cancer Neg Hx     Social History   Tobacco Use   Smoking status: Former    Packs/day: 0.30    Years: 3.00    Pack years: 0.90    Types: Cigarettes    Quit date: 2013    Years since quitting: 9.6   Smokeless tobacco: Never  Vaping Use   Vaping Use: Never used  Substance Use Topics   Alcohol use: Yes    Alcohol/week: 2.0 standard drinks    Types: 2 Cans of beer per week   Drug use: Never    Home Medications Prior to Admission medications   Medication Sig Start Date End Date Taking? Authorizing Provider  albuterol (VENTOLIN HFA) 108 (90 Base) MCG/ACT inhaler Inhale 2 puffs into the lungs every 6 (six) hours as needed for wheezing or shortness of breath. 11/04/20   Margaretann Loveless, PA-C  amoxicillin-clavulanate (AUGMENTIN) 875-125 MG tablet Take 1 tablet by mouth 2 (two) times daily. 12/06/20   [name], MD  amphetamine-dextroamphetamine (ADDERALL XR) 30 MG 24 hr capsule TAKE 1 CAPSULE BY MOUTH ONCE DAILY EVERY MORNING. 07/14/20 01/10/21  Pucilowski, Olgierd A, MD  amphetamine-dextroamphetamine (ADDERALL XR) 30 MG 24 hr capsule TAKE 1 CAPSULE BY MOUTH ONCE DAILY EVERY MORNING. FILL 07/24/20 07/14/20 01/10/21  Pucilowski, Olgierd A, MD  amphetamine-dextroamphetamine (ADDERALL XR) 30 MG 24 hr capsule TAKE 1 CAPSULE BY MOUTH DAILY EVERY MORNING. FILL 08/23/20 07/14/20 01/10/21  Pucilowski, Olgierd A, MD   amphetamine-dextroamphetamine (ADDERALL) 10 MG tablet TAKE 1 TABLET BY MOUTH DAILY AT 4 PM. 07/14/20 01/10/21  Pucilowski, Olgierd A, MD  amphetamine-dextroamphetamine (ADDERALL) 10 MG tablet TAKE 1 TABLET BY MOUTH DAILY AT 4 PM. 07/14/20 01/10/21  Pucilowski, Roosvelt Maser, MD  amphetamine-dextroamphetamine (ADDERALL) 10  MG tablet TAKE 1 TABLET BY MOUTH DAILY AT 4 PM. FILL 08/23/20 07/14/20 01/10/21  Pucilowski, Roosvelt Maser, MD  doxycycline (VIBRA-TABS) 100 MG tablet Take 1 tablet (100 mg total) by mouth 2 (two) times daily. 12/06/20   [name], MD  fluticasone (FLOVENT HFA) 110 MCG/ACT inhaler Inhale 2 puffs into the lungs 2 (two) times daily. 11/23/20   Margaretann Loveless, PA-C  HYDROcodone bit-homatropine (HYCODAN) 5-1.5 MG/5ML syrup Take 5 mLs by mouth every 8 (eight) hours as needed for cough (sedation caution). 12/06/20   [name], MD  ipratropium-albuterol (DUONEB) 0.5-2.5 (3) MG/3ML SOLN Take 3 mLs by nebulization every 6 (six) hours as needed. 11/23/20   Margaretann Loveless, PA-C  mometasone (ELOCON) 0.1 % lotion APPLY TO SCALP LET SIT OVERNIGHT & RINSE OUT IN THE AM, USE AT NIGHT X2WKS DECREASE TO 2 TIMES PER WEEK UNTIL CLEAR, THEN AS NEEDED FOR FLARES 06/15/20 06/15/21  Deirdre Evener, MD    Allergies    Other, Phenazopyridine, Benadryl [diphenhydramine], Prednisone, and Pyridium [phenazopyridine hcl]  Review of Systems   Review of Systems  Constitutional:  Positive for activity change, appetite change, fatigue and fever. Negative for chills.  HENT:  Negative for ear pain and sore throat.   Eyes:  Negative for pain and visual disturbance.  Respiratory:  Positive for cough and chest tightness. Negative for shortness of breath.   Cardiovascular:  Negative for chest pain and palpitations.  Gastrointestinal:  Negative for abdominal pain and vomiting.  Genitourinary:  Negative for dysuria and hematuria.  Musculoskeletal:  Negative for arthralgias and back pain.  Skin:  Negative for  color change and rash.  Neurological:  Negative for seizures and syncope.  All other systems reviewed and are negative.  Physical Exam Updated Vital Signs BP 110/71 (BP Location: Left Arm)   Pulse 90   Temp 98.5 F (36.9 C) (Oral)   Resp (!) 22   SpO2 98%   Physical Exam Vitals and nursing note reviewed.  Constitutional:      General: She is not in acute distress.    Appearance: She is well-developed. She is obese. She is not ill-appearing or toxic-appearing.  HENT:     Head: Normocephalic and atraumatic.  Eyes:     Conjunctiva/sclera: Conjunctivae normal.  Cardiovascular:     Rate and Rhythm: Normal rate and regular rhythm.     Heart sounds: No murmur heard. Pulmonary:     Effort: Pulmonary effort is normal. No respiratory distress.     Breath sounds: Normal breath sounds. No stridor. No wheezing, rhonchi or rales.  Abdominal:     Palpations: Abdomen is soft.     Tenderness: There is no abdominal tenderness. There is no guarding or rebound.  Musculoskeletal:     Cervical back: Neck supple.  Skin:    General: Skin is warm and dry.     Capillary Refill: Capillary refill takes less than 2 seconds.  Neurological:     Mental Status: She is alert and oriented to person, place, and time.    ED Results / Procedures / Treatments   Labs (all labs ordered are listed, but only abnormal results are displayed) Labs Reviewed  BASIC METABOLIC PANEL - Abnormal; Notable for the following components:      Result Value   Glucose, Bld 102 (*)    All other components within normal limits  CBC WITH DIFFERENTIAL/PLATELET - Abnormal; Notable for the following components:   WBC 14.7 (*)    Neutro  Abs 9.6 (*)    Abs Immature Granulocytes 0.39 (*)    All other components within normal limits  I-STAT BETA HCG BLOOD, ED ([facility], [facility], [facility] ONLY)    EKG EKG Interpretation  Date/Time:  Thursday December 08 2020 16:08:38 EDT Ventricular Rate:  99 PR Interval:  130 QRS Duration: 74 QT  Interval:  348 QTC Calculation: 446 R Axis:   78 Text Interpretation: Normal sinus rhythm Non-specific ST-t changes No previous tracing Confirmed by Cathren Laine ([id number]) on 12/08/2020 6:42:10 PM  Radiology No results found.  Procedures Procedures   Medications Ordered in ED Medications - No data to display  ED Course  I have reviewed the triage vital signs and the nursing notes.  Pertinent labs & imaging results that were available during my care of the patient were reviewed by me and considered in my medical decision making (see chart for details).    MDM Rules/Calculators/A&P                           40 year old female with above past medical history presenting to the emergency department with persistent cough, shortness of breath, chest pain in the setting of recent COVID-19 diagnosis and recent community-acquired pneumonia diagnosis.  Vital signs reviewed, she is afebrile with a normal oxygen saturation on room air.  Slightly tachypneic.  Physical exam is notable for a nonproductive cough, lungs clear to auscultation bilaterally.  Differential includes refractory community-acquired pneumonia, persistent COVID-19 symptoms, other viral syndrome, pulmonary embolism, costochondritis, pleurisy.  The patient is not tachycardic.  She is not hypoxic here.  She has no leg swelling.  No history of PE or DVT.  The patient does not describe chest pain, but is on both sides of her sternum and essentially only present when coughing, and seems much more consistent with pleurisy.  I do not believe that she has a pulmonary embolism and I do not believe that imaging for this diagnosis is currently indicated.  She is currently on Augmentin and doxycycline pleat she still has a mild leukocytosis of 14, but over all improved from her leukocytosis of 16 2 days ago.  I believe that this is an appropriate antibiotic regimen.  Her symptoms seem most consistent with slowly but gradually resolving symptoms from  her multiple recent lung infections.  We ambulated the patient here around the department and she had completely normal oxygen saturations with this.  Although they are concerned about that her pulse oximeter reading low at home, she does not appear to be hypoxic here and I do not believe that admission or supplemental oxygen is currently indicated.  I believe that they are taking appropriate care measures at home.  I believe that she is stable for discharge from the emergency department at this time.  Patient is comfortable with the plan.  I encouraged close primary care follow-up.  Strict return precautions provided and verbal and written format.  Final Clinical Impression(s) / ED Diagnoses Final diagnoses:  Community acquired pneumonia, unspecified laterality    Rx / DC Orders ED Discharge Orders     None        Lenard Lance, MD 12/08/20 2140    Cathren Laine, MD 12/08/20 [phone]

## 2020-12-08 NOTE — Telephone Encounter (Signed)
Update on pt 12/08/20; today heart rate staying between 110-123.pt cannot ck BP now; pt has prod cough with brown phlegm, no blood seen. Pt started augmentin and doxycycline on 12/06/20; pt does not think she has fever; body is sore allover and today mid chest is really sore;SOB same as when seen;inhalers and Hycodan help the cough but pt has coughed non stop since talking with me on phone. Pt having to prop her self up; pulse ox is 92% when sitting but if pt lays down pulse ox goes to 88%. Pt said the wheezing is the same as when seen on 12/06/20. Pt used nebulizer at 6 AM and it helped for short time. Pt is sleeping in short spurts with head elevated. Pt said she does not feel any better than when seen on 12/06/20. Dr Para March recommends pt should go to ED. Pt was + covid on 11/22/20.Advised pt and Crystal Hammond (DPR signed) and pt said she will go to [facility] ED today; pt has to get her mom to watch her children. Sending note to Dr Para March.

## 2020-12-08 NOTE — Telephone Encounter (Signed)
Dr. Para March requested this morning for Triage to call and check on patient. She was seen 12/06/20 by him. Patient tested positive for covid 11/22/20 and went to UC on 11/30/20. Had fever and cough when seen by Dr. Para March; concern with possible pneumonia. Please call and check on patient today.

## 2020-12-08 NOTE — ED Provider Notes (Signed)
Emergency Medicine Provider Triage Evaluation Note  ZERENITY BOWRON , a 40 y.o. female  was evaluated in triage.  Pt complains of sob.  Review of Systems  Positive: Sob, cough, gen weakness, covid+  Negative: N/v/d, dysuria  Physical Exam  BP 105/73 (BP Location: Left Arm)   Pulse 94   Temp 98.8 F (37.1 C)   Resp 20   SpO2 99%  Gen:   Awake, no distress   Resp:  Normal effort  MSK:   Moves extremities without difficulty  Other:    Medical Decision Making  Medically screening exam initiated at 4:24 PM.  Appropriate orders placed.  LUSINE CORLETT was informed that the remainder of the evaluation will be completed by another provider, this initial triage assessment does not replace that evaluation, and the importance of remaining in the ED until their evaluation is complete.  Pt diagnosed with COVID 3 weeks ago, has had persistent worsening cough, sob, and home O2 documented in the 80s for the past few days.  Has been on multiple rounds of abx and prednisone without improvement.  No prior hx of PE/DVT.  Have been vaccinated for covid without booster.  Works in [employer].    Fayrene Helper, PA-C 12/08/20 1626    Koleen Distance, MD 12/08/20 [phone]

## 2020-12-08 NOTE — Telephone Encounter (Addendum)
Agree. Thanks. Will await ER note. 

## 2020-12-09 ENCOUNTER — Telehealth: Payer: Self-pay

## 2020-12-09 NOTE — Telephone Encounter (Signed)
FMLA paperwork completed and faxed  Copy given to Amalia Hailey (pt husband)  Copy for scan   Copy retained by me

## 2020-12-12 NOTE — Telephone Encounter (Signed)
FMLA paperwork updated,completed and faxed   Copy given to Amalia Hailey (pt husband)   Copy for scan    Copy retained by me

## 2020-12-12 NOTE — Telephone Encounter (Signed)
Emailed document to [email or web address] also per pts husband

## 2020-12-15 ENCOUNTER — Encounter: Payer: Self-pay | Admitting: Family Medicine

## 2020-12-15 ENCOUNTER — Other Ambulatory Visit: Payer: Self-pay

## 2020-12-15 ENCOUNTER — Ambulatory Visit (INDEPENDENT_AMBULATORY_CARE_PROVIDER_SITE_OTHER): Payer: 59 | Admitting: Family Medicine

## 2020-12-15 VITALS — BP 112/68 | HR 107 | Temp 96.9°F | Ht 60.0 in | Wt 181.0 lb

## 2020-12-15 DIAGNOSIS — R059 Cough, unspecified: Secondary | ICD-10-CM

## 2020-12-15 DIAGNOSIS — R0602 Shortness of breath: Secondary | ICD-10-CM | POA: Diagnosis not present

## 2020-12-15 NOTE — Patient Instructions (Signed)
Go to the lab on the way out.   If you have mychart we'll likely use that to update you.    Take care.  Glad to see you. Let me know if I need to do extra paperwork about your sick time.

## 2020-12-15 NOTE — Progress Notes (Signed)
This visit occurred during the SARS-CoV-2 public health emergency.  Safety protocols were in place, including screening questions prior to the visit, additional usage of staff PPE, and extensive cleaning of exam room while observing appropriate contact time as indicated for disinfecting solutions.  She is still coughing.  Per patient, her leave at work got denied.  She is still coughing and is unable to work at this point due to the cough.  She is still getting SOB, waking SOB.  Pulse elevation occ noted.  Having to use SABA prn/frequently.  Still with sputum, yellow.  She feels like she is still wheezing.  Fatigued walking up stairs.  Still having chills and aches.  She had trouble laying flat, from cough.  No fevers variable pulse ox at home.   Meds, vitals, and allergies reviewed.   ROS: Per HPI unless specifically indicated in ROS section   Nad Ncat Coughing triggered by talking in the exam. Neck supple, no LA Rrr Ctab No wheeze.   Abdomen soft. Extremities well perfused.

## 2020-12-16 ENCOUNTER — Telehealth: Payer: Self-pay

## 2020-12-16 LAB — COMPREHENSIVE METABOLIC PANEL
ALT: 12 U/L (ref 0–35)
AST: 14 U/L (ref 0–37)
Albumin: 3.8 g/dL (ref 3.5–5.2)
Alkaline Phosphatase: 45 U/L (ref 39–117)
BUN: 6 mg/dL (ref 6–23)
CO2: 29 mEq/L (ref 19–32)
Calcium: 8.8 mg/dL (ref 8.4–10.5)
Chloride: 102 mEq/L (ref 96–112)
Creatinine, Ser: 0.66 mg/dL (ref 0.40–1.20)
GFR: 111.18 mL/min (ref >60.00)
Glucose, Bld: 105 mg/dL — ABNORMAL HIGH (ref 70–99)
Potassium: 3.9 mEq/L (ref 3.5–5.1)
Sodium: 137 mEq/L (ref 135–145)
Total Bilirubin: 0.4 mg/dL (ref 0.2–1.2)
Total Protein: 6.8 g/dL (ref 6.0–8.3)

## 2020-12-16 LAB — CBC WITH DIFFERENTIAL/PLATELET
Basophils Absolute: 0.1 10*3/uL (ref 0.0–0.1)
Basophils Relative: 0.6 % (ref 0.0–3.0)
Eosinophils Absolute: 0.1 10*3/uL (ref 0.0–0.7)
Eosinophils Relative: 1.2 % (ref 0.0–5.0)
HCT: 35 % — ABNORMAL LOW (ref 36.0–46.0)
Hemoglobin: 11.3 g/dL — ABNORMAL LOW (ref 12.0–15.0)
Lymphocytes Relative: 21.9 % (ref 12.0–46.0)
Lymphs Abs: 2.4 10*3/uL (ref 0.7–4.0)
MCHC: 32.3 g/dL (ref 30.0–36.0)
MCV: 82.3 fl (ref 78.0–100.0)
Monocytes Absolute: 0.7 10*3/uL (ref 0.1–1.0)
Monocytes Relative: 5.9 % (ref 3.0–12.0)
Neutro Abs: 7.9 10*3/uL — ABNORMAL HIGH (ref 1.4–7.7)
Neutrophils Relative %: 70.4 % (ref 43.0–77.0)
Platelets: 311 10*3/uL (ref 150.0–400.0)
RBC: 4.25 Mil/uL (ref 3.87–5.11)
RDW: 14.2 % (ref 11.5–15.5)
WBC: 11.2 10*3/uL — ABNORMAL HIGH (ref 4.0–10.5)

## 2020-12-16 LAB — BRAIN NATRIURETIC PEPTIDE: Pro B Natriuretic peptide (BNP): 13 pg/mL (ref 0.0–100.0)

## 2020-12-16 LAB — D-DIMER, QUANTITATIVE: D-Dimer, Quant: 0.26 mcg/mL FEU (ref <0.50)

## 2020-12-16 MED ORDER — HYDROCODONE BIT-HOMATROP MBR 5-1.5 MG/5ML PO SOLN
5.0000 mL | Freq: Three times a day (TID) | ORAL | 0 refills | Status: DC | PRN
Start: 2020-12-16 — End: 2020-12-29

## 2020-12-16 NOTE — Addendum Note (Signed)
Addended by: [name] on: 12/16/2020 01:53 PM   Modules accepted: Orders

## 2020-12-16 NOTE — Telephone Encounter (Signed)
Patient was seen yesterday for ongoing SOB and cough. Patients cough is still very bad and was wanting to see about getting refill on the cough medicine she was given before?

## 2020-12-16 NOTE — Telephone Encounter (Signed)
Sent. Thanks.   

## 2020-12-16 NOTE — Telephone Encounter (Signed)
Patient aware rx sent  

## 2020-12-18 NOTE — Assessment & Plan Note (Signed)
At this point likely with postinfectious cough but we need to exclude heart failure and thromboembolic disease by checking BNP in D-dimer respectively.  Risk of false positive discussed with patient.  Still okay for outpatient follow-up.  Continue cough suppressant in the meantime, rest and fluids.  I can work on additional paperwork for her for job if needed.  She is unable to work at this point given how much she is coughing.  She still okay for outpatient follow-up but she is not appropriate for work.

## 2020-12-29 ENCOUNTER — Telehealth: Payer: Self-pay

## 2020-12-29 ENCOUNTER — Other Ambulatory Visit: Payer: Self-pay

## 2020-12-29 MED ORDER — AMPHETAMINE-DEXTROAMPHETAMINE 10 MG PO TABS
10.0000 mg | ORAL_TABLET | Freq: Every day | ORAL | 0 refills | Status: DC
  Filled 2020-12-29: qty 30, 30d supply, fill #0

## 2020-12-29 MED ORDER — AMPHETAMINE-DEXTROAMPHET ER 30 MG PO CP24
30.0000 mg | ORAL_CAPSULE | Freq: Every day | ORAL | 0 refills | Status: DC
  Filled 2020-12-29 – 2020-12-30 (×2): qty 30, 30d supply, fill #0

## 2020-12-29 MED ORDER — HYDROCODONE BIT-HOMATROP MBR 5-1.5 MG/5ML PO SOLN
5.0000 mL | Freq: Three times a day (TID) | ORAL | 0 refills | Status: DC | PRN
  Filled 2020-12-29: qty 75, 5d supply, fill #0

## 2020-12-29 NOTE — Telephone Encounter (Signed)
Patient aware rxs were sent.

## 2020-12-29 NOTE — Addendum Note (Signed)
Addended by: [name] on: 12/29/2020 01:57 PM   Modules accepted: Orders

## 2020-12-29 NOTE — Telephone Encounter (Signed)
Patient needs refill on Adderall XL 30 mg caps and 10 mg tablets.   LOV - 12/15/20 NOV - not scheduled  RF - 07/14/20  Patient also wants to see about getting refill on cough medication as her cough has not gotten better yet. Rxs need to be sent to [facility] pharmacy.

## 2020-12-29 NOTE — Telephone Encounter (Signed)
Sent.  If the cough is worse, then needs eval.  If gradually getting better (even if slowly), then okay to continue with the cough medicine.  Thanks.

## 2020-12-30 ENCOUNTER — Other Ambulatory Visit: Payer: Self-pay

## 2020-12-30 MED FILL — Mometasone Furoate Solution 0.1% (Lotion): CUTANEOUS | 14 days supply | Qty: 60 | Fill #0 | Status: AC

## 2021-01-05 ENCOUNTER — Encounter: Payer: Self-pay | Admitting: Family Medicine

## 2021-01-09 NOTE — Telephone Encounter (Signed)
Patient made f/u appt for next week. Patient wanted this appt to be rechecked.

## 2021-01-09 NOTE — Telephone Encounter (Signed)
Please check with patient.  If she feels well enough to go back to work, then she shouldn't need an OV.  Thanks.

## 2021-01-13 ENCOUNTER — Other Ambulatory Visit: Payer: Self-pay

## 2021-01-13 ENCOUNTER — Ambulatory Visit (INDEPENDENT_AMBULATORY_CARE_PROVIDER_SITE_OTHER): Payer: 59 | Admitting: Family Medicine

## 2021-01-13 VITALS — BP 120/72 | HR 116 | Temp 98.0°F | Ht 60.0 in | Wt 181.0 lb

## 2021-01-13 DIAGNOSIS — F329 Major depressive disorder, single episode, unspecified: Secondary | ICD-10-CM

## 2021-01-13 DIAGNOSIS — G43009 Migraine without aura, not intractable, without status migrainosus: Secondary | ICD-10-CM | POA: Diagnosis not present

## 2021-01-13 DIAGNOSIS — G43109 Migraine with aura, not intractable, without status migrainosus: Secondary | ICD-10-CM | POA: Diagnosis not present

## 2021-01-13 DIAGNOSIS — R059 Cough, unspecified: Secondary | ICD-10-CM

## 2021-01-13 DIAGNOSIS — R9389 Abnormal findings on diagnostic imaging of other specified body structures: Secondary | ICD-10-CM | POA: Diagnosis not present

## 2021-01-13 DIAGNOSIS — F419 Anxiety disorder, unspecified: Secondary | ICD-10-CM

## 2021-01-13 DIAGNOSIS — Z029 Encounter for administrative examinations, unspecified: Secondary | ICD-10-CM

## 2021-01-13 DIAGNOSIS — Z23 Encounter for immunization: Secondary | ICD-10-CM

## 2021-01-13 DIAGNOSIS — F32A Depression, unspecified: Secondary | ICD-10-CM

## 2021-01-13 MED ORDER — SUMATRIPTAN SUCCINATE 50 MG PO TABS
50.0000 mg | ORAL_TABLET | ORAL | 1 refills | Status: DC | PRN
  Filled 2021-01-13: qty 9, 15d supply, fill #0

## 2021-01-13 MED ORDER — CLOBETASOL PROPIONATE 0.05 % EX SHAM
MEDICATED_SHAMPOO | CUTANEOUS | 0 refills | Status: DC
  Filled 2021-01-13: qty 118, 14d supply, fill #0

## 2021-01-13 NOTE — Progress Notes (Signed)
This visit occurred during the SARS-CoV-2 public health emergency.  Safety protocols were in place, including screening questions prior to the visit, additional usage of staff PPE, and extensive cleaning of exam room while observing appropriate contact time as indicated for disinfecting solutions.  Cough is almost totally gone.  She clearly feels better.  Anxiety is higher and noted psoriasis outbreak on her scalp.  No SI/HI.  D/w pt about restarting counseling.  Referral placed.  She is having more migraines with aura in the meantime and we talked about neurology referral.  H/o photo and phonophobia noted with headaches.  She is applying to [employer] school.  She isn't aware of any exclusions or disqualifications, d/w pt. see forms.  Fluid hepatitis B vaccine done today.  This is her third dose of hepatitis B vaccine.  D/w pt about repeat CXR given borderline cardiomegaly.  She likely had relative decrease in inspiratory volume related to a deep breath triggering a cough which may have led to the issue previously noted with borderline cardiomegaly.  Discussed with patient.  Meds, vitals, and allergies reviewed.   ROS: Per HPI unless specifically indicated in ROS section   Nad Ncat except for psoriatic changes noted on the scalp.   No fluctuant mass. Neck supplel, no LA Rrr Ctab Abd soft, not ttp.  Ext without edema Skin well perfused Recheck pulse 90. CN 2-12 wnl B, S/S/DTR wnl x4  30 minutes were devoted to patient care in this encounter (this includes time spent reviewing the patient's file/history, interviewing and examining the patient, counseling/reviewing plan with patient).

## 2021-01-13 NOTE — Patient Instructions (Addendum)
We'll call about seeing counseling and neurology.   Use the shampoo in the meantime and see if that helps.   Flu and Hep B shot today.  Try imitrex for the migraines.   Recheck CXR in about 1 month.    Take care.  Glad to see you.

## 2021-01-15 DIAGNOSIS — Z029 Encounter for administrative examinations, unspecified: Secondary | ICD-10-CM | POA: Insufficient documentation

## 2021-01-15 DIAGNOSIS — Z0000 Encounter for general adult medical examination without abnormal findings: Secondary | ICD-10-CM | POA: Insufficient documentation

## 2021-01-15 NOTE — Assessment & Plan Note (Signed)
Discussed trial of Imitrex with routine cautions given to patient.  She will update me as needed.

## 2021-01-15 NOTE — Assessment & Plan Note (Signed)
Forms done for school.  Flu vaccine and third hepatitis B vaccine done at office visit.

## 2021-01-15 NOTE — Assessment & Plan Note (Addendum)
Clearly better.  Work note done.  She will update me as needed.  See above discussion regarding x-ray.  We can recheck follow-up x-ray to make sure she does not have persistent cardiomegaly noted.

## 2021-01-15 NOTE — Assessment & Plan Note (Addendum)
Discussed options and she will restart with counseling.  Referral placed.  Still okay for outpatient follow-up.
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
## 2021-02-07 ENCOUNTER — Ambulatory Visit (INDEPENDENT_AMBULATORY_CARE_PROVIDER_SITE_OTHER): Payer: 59 | Admitting: Psychiatry

## 2021-02-07 ENCOUNTER — Other Ambulatory Visit: Payer: Self-pay

## 2021-02-07 ENCOUNTER — Other Ambulatory Visit: Payer: Self-pay | Admitting: Family Medicine

## 2021-02-07 ENCOUNTER — Encounter: Payer: Self-pay | Admitting: Psychiatry

## 2021-02-07 VITALS — BP 108/66 | HR 68 | Ht 60.0 in | Wt 182.0 lb

## 2021-02-07 DIAGNOSIS — G43109 Migraine with aura, not intractable, without status migrainosus: Secondary | ICD-10-CM

## 2021-02-07 MED ORDER — ZOLMITRIPTAN 5 MG NA SOLN
1.0000 | NASAL | 2 refills | Status: DC | PRN
  Filled 2021-02-07: qty 12, 30d supply, fill #0

## 2021-02-07 MED ORDER — NORTRIPTYLINE HCL 25 MG PO CAPS
25.0000 mg | ORAL_CAPSULE | Freq: Every day | ORAL | 3 refills | Status: DC
  Filled 2021-02-07: qty 30, 30d supply, fill #0

## 2021-02-07 NOTE — Telephone Encounter (Signed)
Refill request for ADDERALL XR 30 MG 24 hr capsule  LOV - 01/13/21 Next OV - not scheduled Last refill - 12/29/20 #30/0

## 2021-02-07 NOTE — Patient Instructions (Addendum)
Start nortriptyline 25 mg nightly at bedtime for headache prevention Start Zomig nasal spray as needed for migraine. Use 1 spray at the onset of migraine. If headache recurs or does not fully resolve, you may take a second dose after 2 hours. Please avoid taking more than 2 days per week or 10 days per month to avoid rebound headaches.

## 2021-02-08 ENCOUNTER — Other Ambulatory Visit: Payer: Self-pay

## 2021-02-08 MED FILL — Amphetamine-Dextroamphetamine Cap ER 24HR 30 MG: ORAL | 30 days supply | Qty: 30 | Fill #0 | Status: AC

## 2021-02-08 NOTE — Telephone Encounter (Signed)
Sent. Thanks.   

## 2021-02-09 ENCOUNTER — Ambulatory Visit (INDEPENDENT_AMBULATORY_CARE_PROVIDER_SITE_OTHER): Payer: 59 | Admitting: Psychology

## 2021-02-09 DIAGNOSIS — F4323 Adjustment disorder with mixed anxiety and depressed mood: Secondary | ICD-10-CM

## 2021-02-17 ENCOUNTER — Other Ambulatory Visit: Payer: Self-pay

## 2021-02-21 ENCOUNTER — Other Ambulatory Visit: Payer: Self-pay

## 2021-02-21 ENCOUNTER — Ambulatory Visit (INDEPENDENT_AMBULATORY_CARE_PROVIDER_SITE_OTHER): Payer: 59 | Admitting: Family Medicine

## 2021-02-21 ENCOUNTER — Encounter: Payer: Self-pay | Admitting: Family Medicine

## 2021-02-21 VITALS — BP 114/80 | HR 112 | Temp 97.6°F | Ht 60.0 in | Wt 182.0 lb

## 2021-02-21 DIAGNOSIS — R109 Unspecified abdominal pain: Secondary | ICD-10-CM | POA: Insufficient documentation

## 2021-02-21 DIAGNOSIS — R197 Diarrhea, unspecified: Secondary | ICD-10-CM | POA: Diagnosis not present

## 2021-02-21 DIAGNOSIS — R1084 Generalized abdominal pain: Secondary | ICD-10-CM | POA: Diagnosis not present

## 2021-02-21 LAB — CBC WITH DIFFERENTIAL/PLATELET
Basophils Absolute: 0 10*3/uL (ref 0.0–0.1)
Basophils Relative: 0.2 % (ref 0.0–3.0)
Eosinophils Absolute: 0.3 10*3/uL (ref 0.0–0.7)
Eosinophils Relative: 3.9 % (ref 0.0–5.0)
HCT: 36.5 % (ref 36.0–46.0)
Hemoglobin: 11.9 g/dL — ABNORMAL LOW (ref 12.0–15.0)
Lymphocytes Relative: 28.9 % (ref 12.0–46.0)
Lymphs Abs: 2.5 10*3/uL (ref 0.7–4.0)
MCHC: 32.5 g/dL (ref 30.0–36.0)
MCV: 81.6 fl (ref 78.0–100.0)
Monocytes Absolute: 0.5 10*3/uL (ref 0.1–1.0)
Monocytes Relative: 6.4 % (ref 3.0–12.0)
Neutro Abs: 5.1 10*3/uL (ref 1.4–7.7)
Neutrophils Relative %: 60.6 % (ref 43.0–77.0)
Platelets: 308 10*3/uL (ref 150.0–400.0)
RBC: 4.47 Mil/uL (ref 3.87–5.11)
RDW: 14.4 % (ref 11.5–15.5)
WBC: 8.5 10*3/uL (ref 4.0–10.5)

## 2021-02-21 LAB — COMPREHENSIVE METABOLIC PANEL
ALT: 15 U/L (ref 0–35)
AST: 15 U/L (ref 0–37)
Albumin: 4 g/dL (ref 3.5–5.2)
Alkaline Phosphatase: 51 U/L (ref 39–117)
BUN: 8 mg/dL (ref 6–23)
CO2: 29 mEq/L (ref 19–32)
Calcium: 9.1 mg/dL (ref 8.4–10.5)
Chloride: 102 mEq/L (ref 96–112)
Creatinine, Ser: 0.65 mg/dL (ref 0.40–1.20)
GFR: 111.45 mL/min (ref >60.00)
Glucose, Bld: 94 mg/dL (ref 70–99)
Potassium: 3.9 mEq/L (ref 3.5–5.1)
Sodium: 137 mEq/L (ref 135–145)
Total Bilirubin: 0.3 mg/dL (ref 0.2–1.2)
Total Protein: 7.2 g/dL (ref 6.0–8.3)

## 2021-02-21 LAB — LIPASE: Lipase: 13 U/L (ref 11.0–59.0)

## 2021-02-21 MED ORDER — ONDANSETRON HCL 8 MG PO TABS
8.0000 mg | ORAL_TABLET | Freq: Three times a day (TID) | ORAL | 1 refills | Status: DC | PRN
  Filled 2021-02-21: qty 20, 7d supply, fill #0

## 2021-02-21 MED ORDER — FAMOTIDINE 20 MG PO TABS
20.0000 mg | ORAL_TABLET | Freq: Every day | ORAL | 0 refills | Status: DC
  Filled 2021-02-21: qty 30, 30d supply, fill #0

## 2021-02-21 NOTE — Assessment & Plan Note (Signed)
With diarrhea and nausea  H/o multiple complex abd surgeries -need to watch carefully  See A/P for diarrhea Labs ordered

## 2021-02-21 NOTE — Progress Notes (Signed)
Subjective:    Patient ID: Crystal Hammond, female    DOB: [birth year], 40 y.o.   MRN: [id number]  This visit occurred during the SARS-CoV-2 public health emergency.  Safety protocols were in place, including screening questions prior to the visit, additional usage of staff PPE, and extensive cleaning of exam room while observing appropriate contact time as indicated for disinfecting solutions.   HPI Pt presents with nausea and loose stool  Wt Readings from Last 3 Encounters:  02/21/21 182 lb (82.6 kg)  02/07/21 182 lb (82.6 kg)  01/13/21 181 lb (82.1 kg)   35.54 kg/m  Symptoms since Sunday  Ate ham/sweet potatoes and mac/cheese prior   Woke up sick the next day   Nausea and no appetite Not vomiting   Abd pain - in the middle of her abdomen Dull pain  No sharp or burning pain   No chance she is pregnant  No migraine right now   Tried brat diet   Loose stools- 10-15 minutes after she eats  No blood in stool  Gets some cramping just before bm   Drinking only water Trying hard to stay hydrated   Aches : all over  Very achy on Sunday  No fever at all   Did sweat more than usual at work /no chills    No cough or nasal symptoms  Throat was dry feeling   At home, 3 of 4 kids got the flu  Exp to covid at the hospital   At home covid test was negative     BP Readings from Last 3 Encounters:  02/21/21 114/80  02/07/21 108/66  01/13/21 120/72   Pulse Readings from Last 3 Encounters:  02/21/21 (!) 112  02/07/21 68  01/13/21 (!) 116  Per pt-pulse has remained high since she had covid and pna this summer Pepto helped just a little   History of multiple GI surgeries  Hernia surgery-never healed properly  Has scars that don't fully heal Also gastrochisis   Lab Results  Component Value Date   WBC 11.2 (H) 12/15/2020   HGB 11.3 (L) 12/15/2020   HCT 35.0 (L) 12/15/2020   MCV 82.3 12/15/2020   PLT 311.0 12/15/2020   Patient Active Problem List    Diagnosis Date Noted   Diarrhea 02/21/2021   Abdominal pain 02/21/2021   Administrative encounter 01/15/2021   Cough 12/07/2020   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   Itching 07/22/2020   Congestion of paranasal sinus 05/25/2020   Vitamin D deficiency 01/26/2020   Incisional hernia 12/11/2019   Acute bronchitis with bronchospasm 09/18/2019   Prediabetes 07/26/2019   GAD (generalized anxiety disorder) 06/25/2019   Adult ADHD 06/25/2019   Depressive disorder 06/25/2019   Diet controlled gestational diabetes mellitus (GDM) in third trimester 05/27/2017   Family history of congenital anomalies 05/27/2017   Herpes 05/27/2017   Hx of cesarean section 05/27/2017   Migraine with aura 05/27/2017   Agoraphobia 05/30/2016   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   History of abnormal cervical Pap smear 01/31/2016   History of kidney stones 01/31/2016   Past Medical History:  Diagnosis Date   Anxiety    Cervical dysplasia    Complication of anesthesia    LOW BLOOD PRESSURE   Depression    Endometriosis    Gastroschisis [birth year]1982 (birth)   Heart murmur    OUTGREW   History of febrile seizure    History of kidney stones    History of pneumonia  Migraines    OCD (obsessive compulsive disorder)    Oral herpes    Pneumonia    PONV (postoperative nausea and vomiting)    Postpartum depression    Pre-diabetes    PTSD (post-traumatic stress disorder)    Recurrent UTI    Past Surgical History:  Procedure Laterality Date   ABDOMINAL SURGERY  2012   Endometrial Surgery - went through c-section scar   CESAREAN SECTION  2019   CESAREAN SECTION  2007   CESAREAN SECTION  2014   CESAREAN SECTION  2016   GASTROSCHISIS CLOSURE     LEEP     x 2   VENTRAL HERNIA REPAIR N/A 12/11/2019   Procedure: HERNIA REPAIR VENTRAL ADULT;  Surgeon: Herbert Pun, MD;  Location: [facility];  Service: General;  Laterality: N/A;   WISDOM TOOTH EXTRACTION     XI ROBOTIC ASSISTED VENTRAL HERNIA N/A  12/11/2019   Procedure: XI ROBOTIC ASSISTED VENTRAL HERNIA Lap vs open;  Surgeon: Herbert Pun, MD;  Location: [facility];  Service: General;  Laterality: N/A;   Social History   Tobacco Use   Smoking status: Former    Packs/day: 0.30    Years: 3.00    Pack years: 0.90    Types: Cigarettes    Quit date: 2013    Years since quitting: 9.8   Smokeless tobacco: Never  Vaping Use   Vaping Use: Never used  Substance Use Topics   Alcohol use: Yes    Alcohol/week: 2.0 standard drinks    Types: 2 Cans of beer per week   Drug use: Never   Family History  Problem Relation Age of Onset   Depression Mother    Alcohol abuse Mother    Hernia Mother    Rashes / Skin problems Mother    Thyroid disease Mother    Anxiety disorder Mother    Depression Father    Alcohol abuse Father    Gout Father    Diabetes Father    Bipolar disorder Brother    Drug abuse Brother    ADD / ADHD Brother    ADD / ADHD Son    ADD / ADHD Son    Breast cancer Neg Hx    Ovarian cancer Neg Hx    Allergies  Allergen Reactions   Other Swelling    SHELLFISH    Phenazopyridine Hives and Rash   Benadryl [Diphenhydramine]    Prednisone     Intolerant- skin feels hot   Pyridium [Phenazopyridine Hcl]    Current Outpatient Medications on File Prior to Visit  Medication Sig Dispense Refill   albuterol (VENTOLIN HFA) 108 (90 Base) MCG/ACT inhaler Inhale 2 puffs into the lungs every 6 (six) hours as needed for wheezing or shortness of breath. 18 g 0   amphetamine-dextroamphetamine (ADDERALL XR) 30 MG 24 hr capsule Take 1 capsule (30 mg total) by mouth daily. 30 capsule 0   amphetamine-dextroamphetamine (ADDERALL) 10 MG tablet Take 1 tablet (10 mg total) by mouth daily. 30 tablet 0   Clobetasol Propionate 0.05 % shampoo Apply thin film to dry scalp once daily (maximum dose: 50 mL/week); leave in place for 15 minutes, then add water, lather, and rinse thoroughly. 118 mL 0   mometasone (ELOCON) 0.1 % lotion  APPLY TO SCALP LET SIT OVERNIGHT & RINSE OUT IN THE AM, USE AT NIGHT X2WKS DECREASE TO 2 TIMES PER WEEK UNTIL CLEAR, THEN AS NEEDED FOR FLARES 60 mL 2   zolmitriptan (ZOMIG) 5 MG nasal  solution Place 1 spray into the nose as needed for migraine. May repeat a dose in 2 hours if headache persists. 12 each 2   No current facility-administered medications on file prior to visit.     Review of Systems  Constitutional:  Positive for fatigue. Negative for activity change, appetite change, chills, fever and unexpected weight change.  HENT:  Negative for congestion, ear pain, rhinorrhea, sinus pressure and sore throat.   Eyes:  Negative for pain, redness and visual disturbance.  Respiratory:  Negative for cough, shortness of breath and wheezing.   Cardiovascular:  Negative for chest pain and palpitations.  Gastrointestinal:  Positive for abdominal pain, diarrhea and nausea. Negative for abdominal distention, anal bleeding, blood in stool, constipation, rectal pain and vomiting.  Endocrine: Negative for polydipsia and polyuria.  Genitourinary:  Negative for dysuria, frequency and urgency.  Musculoskeletal:  Negative for arthralgias, back pain and myalgias.  Skin:  Negative for pallor and rash.  Allergic/Immunologic: Negative for environmental allergies.  Neurological:  Negative for dizziness, syncope and headaches.  Hematological:  Negative for adenopathy. Does not bruise/bleed easily.  Psychiatric/Behavioral:  Negative for decreased concentration and dysphoric mood. The patient is not nervous/anxious.       Objective:   Physical Exam Constitutional:      General: She is not in acute distress.    Appearance: Normal appearance. She is well-developed. She is obese. She is ill-appearing. She is not diaphoretic.  HENT:     Head: Normocephalic and atraumatic.     Right Ear: Tympanic membrane, ear canal and external ear normal.     Left Ear: Tympanic membrane, ear canal and external ear normal.      Nose: Nose normal.     Mouth/Throat:     Mouth: Mucous membranes are moist.     Pharynx: Oropharynx is clear. No oropharyngeal exudate or posterior oropharyngeal erythema.  Eyes:     General: No scleral icterus.    Conjunctiva/sclera: Conjunctivae normal.     Pupils: Pupils are equal, round, and reactive to light.  Neck:     Thyroid: No thyromegaly.     Vascular: No carotid bruit or JVD.  Cardiovascular:     Rate and Rhythm: Regular rhythm. Tachycardia present.     Heart sounds: Normal heart sounds.    No gallop.  Pulmonary:     Effort: Pulmonary effort is normal. No respiratory distress.     Breath sounds: Normal breath sounds. No stridor. No wheezing, rhonchi or rales.  Abdominal:     General: Bowel sounds are normal. There is no distension or abdominal bruit.     Palpations: Abdomen is soft. There is no mass.     Tenderness: There is generalized abdominal tenderness. There is no guarding or rebound. Negative signs include Murphy's sign and McBurney's sign.     Comments: Generalized abd tenderness  (notable in epigastrium) No pain with movement  More nausea when lying flat    Musculoskeletal:     Cervical back: Normal range of motion and neck supple.     Right lower leg: No edema.     Left lower leg: No edema.  Lymphadenopathy:     Cervical: No cervical adenopathy.  Skin:    General: Skin is warm and dry.     Coloration: Skin is not jaundiced or pale.     Findings: No rash.     Comments: Brisk cap refill  Neurological:     Mental Status: She is alert.  Coordination: Coordination normal.     Deep Tendon Reflexes: Reflexes are normal and symmetric. Reflexes normal.  Psychiatric:        Mood and Affect: Mood normal.          Assessment & Plan:   Problem List Items Addressed This Visit       Other   Diarrhea - Primary    Frequent loose stool with nausea and some abd pain with cramps  Day 2 -3 and no vomiting  In the setting of past abd surgeries and likely  adhesions Also h/o gastroschisis Complex history reviewed in the record today Discussed imp of preventing dehydration  Will monitor carefully  Lab for cbc, cmet and lipase ordered  Consider stool studies if not improving in several days Most likely viral gastroenteritis  pepcid 20 mg daily px to setting stomach and zofran 8 mg tid prn for nausea  Handouts given      Relevant Orders   CBC with Differential/Platelet   Comprehensive metabolic panel   Abdominal pain    With diarrhea and nausea  H/o multiple complex abd surgeries -need to watch carefully  See A/P for diarrhea Labs ordered       Relevant Orders   CBC with Differential/Platelet   Comprehensive metabolic panel   Lipase

## 2021-02-21 NOTE — Patient Instructions (Addendum)
Rehydrate with water or electrolyte drinks  Sips/ not gulps   Pepto is ok if helpful  Pepcid 20 mg daily to calm down acid  Zofran as needed   If you eat- stick to bland food/ BRAT diet  Watch for signs of dehydration -dry mouth/cramps/high pulse  If suddenly worse or worse abdominal pain -go to the ER and let us know   Labs today

## 2021-02-21 NOTE — Assessment & Plan Note (Addendum)
Frequent loose stool with nausea and some abd pain with cramps  Day 2 -3 and no vomiting  In the setting of past abd surgeries and likely adhesions Also h/o gastroschisis Complex history reviewed in the record today Discussed imp of preventing dehydration  Will monitor carefully  Lab for cbc, cmet and lipase ordered  Consider stool studies if not improving in several days Most likely viral gastroenteritis  pepcid 20 mg daily px to setting stomach and zofran 8 mg tid prn for nausea  Handouts given

## 2021-02-24 ENCOUNTER — Ambulatory Visit (INDEPENDENT_AMBULATORY_CARE_PROVIDER_SITE_OTHER): Payer: 59 | Admitting: Psychology

## 2021-02-24 ENCOUNTER — Other Ambulatory Visit: Payer: Self-pay

## 2021-02-24 DIAGNOSIS — F4323 Adjustment disorder with mixed anxiety and depressed mood: Secondary | ICD-10-CM | POA: Diagnosis not present

## 2021-03-13 ENCOUNTER — Other Ambulatory Visit: Payer: Self-pay

## 2021-03-13 ENCOUNTER — Telehealth: Payer: Self-pay | Admitting: Psychiatry

## 2021-03-13 ENCOUNTER — Other Ambulatory Visit: Payer: Self-pay | Admitting: Psychiatry

## 2021-03-13 MED ORDER — NORTRIPTYLINE HCL 25 MG PO CAPS
25.0000 mg | ORAL_CAPSULE | Freq: Every day | ORAL | 3 refills | Status: DC
  Filled 2021-03-13: qty 30, 30d supply, fill #0

## 2021-03-13 NOTE — Telephone Encounter (Signed)
I called the pt and advised we have sent the refill for the nortriptyline into the pharmacy and the PA for the Zomig. I expressed apologies on the delay for this, I advised we had not received a PA request from the pharmacy.  (Key: [id number])  [employer] is reviewing your PA request. You may close this dialog, return to your dashboard, and perform other tasks.  To check for an update later, open this request again from your dashboard. If [employer] has not replied within 24 hours for urgent requests or within 48 hours for standard requests, please contact [employer] at [phone].

## 2021-03-13 NOTE — Telephone Encounter (Signed)
Pt called stating that the pharmacy informed her that her nortriptyline 25 mg was discontinued but she states that is wrong and is needing a refill. Pt also stated that she had sent in a request for the Zomig to be replaced with another medication due to ins not covering it and this has still not been done. Please advise.

## 2021-03-14 ENCOUNTER — Other Ambulatory Visit: Payer: Self-pay

## 2021-03-14 ENCOUNTER — Ambulatory Visit
Admission: RE | Admit: 2021-03-14 | Discharge: 2021-03-14 | Disposition: A | Discharge status: Home / Self Care | Payer: 59 | Source: Ambulatory Visit | Attending: Family Medicine | Admitting: Family Medicine

## 2021-03-14 ENCOUNTER — Other Ambulatory Visit: Payer: 59

## 2021-03-14 ENCOUNTER — Ambulatory Visit
Admission: RE | Admit: 2021-03-14 | Discharge: 2021-03-14 | Disposition: A | Discharge status: Home / Self Care | Payer: 59 | Attending: Family Medicine | Admitting: Family Medicine

## 2021-03-14 DIAGNOSIS — I517 Cardiomegaly: Secondary | ICD-10-CM | POA: Diagnosis not present

## 2021-03-14 DIAGNOSIS — R9389 Abnormal findings on diagnostic imaging of other specified body structures: Secondary | ICD-10-CM | POA: Diagnosis not present

## 2021-03-14 DIAGNOSIS — J189 Pneumonia, unspecified organism: Secondary | ICD-10-CM | POA: Diagnosis not present

## 2021-03-15 ENCOUNTER — Ambulatory Visit (INDEPENDENT_AMBULATORY_CARE_PROVIDER_SITE_OTHER): Payer: 59 | Admitting: Psychology

## 2021-03-15 DIAGNOSIS — F4323 Adjustment disorder with mixed anxiety and depressed mood: Secondary | ICD-10-CM

## 2021-03-16 ENCOUNTER — Other Ambulatory Visit: Payer: Self-pay

## 2021-03-17 ENCOUNTER — Other Ambulatory Visit: Payer: Self-pay

## 2021-03-17 ENCOUNTER — Encounter: Payer: Self-pay | Admitting: *Deleted

## 2021-03-17 ENCOUNTER — Ambulatory Visit (INDEPENDENT_AMBULATORY_CARE_PROVIDER_SITE_OTHER): Payer: 59 | Admitting: Family Medicine

## 2021-03-17 ENCOUNTER — Encounter: Payer: Self-pay | Admitting: Family Medicine

## 2021-03-17 VITALS — BP 122/78 | HR 112 | Temp 98.2°F | Ht 60.0 in | Wt 182.0 lb

## 2021-03-17 DIAGNOSIS — R000 Tachycardia, unspecified: Secondary | ICD-10-CM

## 2021-03-17 DIAGNOSIS — Z20822 Contact with and (suspected) exposure to covid-19: Secondary | ICD-10-CM

## 2021-03-17 DIAGNOSIS — R0602 Shortness of breath: Secondary | ICD-10-CM

## 2021-03-17 LAB — T4, FREE: Free T4: 1.11 ng/dL (ref 0.60–1.60)

## 2021-03-17 LAB — TSH: TSH: 2.01 u[IU]/mL (ref 0.35–5.50)

## 2021-03-17 MED ORDER — FLUTICASONE PROPIONATE HFA 110 MCG/ACT IN AERO
1.0000 | INHALATION_SPRAY | Freq: Two times a day (BID) | RESPIRATORY_TRACT | 12 refills | Status: DC
  Filled 2021-03-17: qty 12, 30d supply, fill #0

## 2021-03-17 MED ORDER — ALBUTEROL SULFATE HFA 108 (90 BASE) MCG/ACT IN AERS
2.0000 | INHALATION_SPRAY | Freq: Four times a day (QID) | RESPIRATORY_TRACT | 0 refills | Status: AC | PRN
  Filled 2021-03-17: qty 18, 25d supply, fill #0

## 2021-03-17 NOTE — Progress Notes (Signed)
This visit occurred during the SARS-CoV-2 public health emergency.  Safety protocols were in place, including screening questions prior to the visit, additional usage of staff PPE, and extensive cleaning of exam room while observing appropriate contact time as indicated for disinfecting solutions.  Follow up from cough.  Prev CXR d/w pt.  She still has SOB with exertion, requiring use of SABA.  Her chest feels "sore" with temporary relief with SABA.  FH asthma noted.  She doesn't have formal dx of asthma.  She has h/o mult PNAs over the years.    Sweating, noted inc in heart rate.  Now with resting heart rate in the 110s.  Had tolerated adderall in the past w/o tachycardia.  She has FH thyroid disease.  She is fatigued.    Scalp is less irritated but not yet resolved.    She couldn't get zomig nasal spray covered.  Couldn't afford it o/w.  She has been taking nortriptyline at night with dec in HA frequency.    Meds, vitals, and allergies reviewed.   ROS: Per HPI unless specifically indicated in ROS section   GEN: nad, alert and oriented HEENT: ncat, mild scalp irritation noted. NECK: supple w/o LA CV: rrr.   PULM: ctab, no inc wob ABD: soft, +bs EXT: no edema SKIN:  Well-perfused.  30 minutes were devoted to patient care in this encounter (this includes time spent reviewing the patient's file/history, interviewing and examining the patient, counseling/reviewing plan with patient).

## 2021-03-17 NOTE — Patient Instructions (Addendum)
Go to the lab on the way out.   If you have mychart we'll likely use that to update you.    Take care.  Glad to see you. We'll call about seeing pulmonary.  I would try using flovent in the meantime.  Rinse after use.

## 2021-03-19 DIAGNOSIS — R000 Tachycardia, unspecified: Secondary | ICD-10-CM | POA: Insufficient documentation

## 2021-03-19 DIAGNOSIS — R0602 Shortness of breath: Secondary | ICD-10-CM | POA: Insufficient documentation

## 2021-03-19 NOTE — Assessment & Plan Note (Signed)
Discussed options.  Also with fatigue and family history of thyroid disease.  See notes on thyroid test today.  Rationale discussed with patient.  Refer to pulmonary.  Previous chest x-ray discussed with patient  Add on Flovent.  Continue as needed albuterol use.  She agrees with plan.

## 2021-03-19 NOTE — Assessment & Plan Note (Signed)
Unclear source.  EKG discussed with patient at office visit.  Refer to pulmonary.  She did not have history of tachycardia with current medications in the past.

## 2021-03-20 LAB — THYROID PEROXIDASE ANTIBODY: Thyroperoxidase Ab SerPl-aCnc: 1 IU/mL (ref <9)

## 2021-03-22 ENCOUNTER — Ambulatory Visit (INDEPENDENT_AMBULATORY_CARE_PROVIDER_SITE_OTHER): Payer: 59 | Admitting: Student

## 2021-03-22 ENCOUNTER — Other Ambulatory Visit: Payer: Self-pay

## 2021-03-22 ENCOUNTER — Encounter: Payer: Self-pay | Admitting: Student

## 2021-03-22 VITALS — BP 112/80 | HR 102 | Temp 98.1°F | Ht 60.0 in | Wt 183.4 lb

## 2021-03-22 DIAGNOSIS — J189 Pneumonia, unspecified organism: Secondary | ICD-10-CM

## 2021-03-22 DIAGNOSIS — R0609 Other forms of dyspnea: Secondary | ICD-10-CM | POA: Diagnosis not present

## 2021-03-22 DIAGNOSIS — R0683 Snoring: Secondary | ICD-10-CM | POA: Diagnosis not present

## 2021-03-22 NOTE — Patient Instructions (Addendum)
-   breathing tests in 4 weeks. Need to stop flovent minimum of 1 week before breathing tests, ideally 2 weeks. Ideally also avoid albuterol minimum of 2 days before breathing tests - immunoglobulin levels today, IgE - home sleep study

## 2021-03-22 NOTE — Progress Notes (Signed)
Synopsis: Referred for dyspnea by [name], MD  Subjective:   PATIENT ID: Crystal Hammond GENDER: female DOB: March 16, 1981, MRN: [id number]  Chief Complaint  Patient presents with   Consult    Pt states SOB   40yF with history of anxiety, endometriosis, covid-19 infection 11/22/20 for second time who is referred by PCP for dyspnea. She had been vaccinated for covid-19.   Had bout of pneumonia after her second covid-19 infection. Never hospitalized.   She has had several admissions in past for pneumonia, including as a child.  Some suspicion for asthma, started on flovent 1 puff BID - has been on it for a few days. Redid carpeting, isolated pets (dogs, cats) in bottom of house. She doesn't have much of a cough. She does have constant central chest burning sensation, not necessarily with deep breath. ALbuterol seems to be the only medication that's helped so far. No associated throat tightness, hoarseness. She doesn't have overtly symptomatic reflux. She does have some post-nasal drip.  Prednisone worsened her rosacea substantially and she didn't notice if it made any difference with her breathing.   Has had some low oxygen saturation at night when she's checked it (as low as 85-88%).   Weight has been stable over years.   She snores, perhaps loud enough to hear through a door. She doesn't truly seem to have excessive daytime sleepiness. She has had a couple episodes of PND.  Otherwise pertinent review of systems is negative.  Son has OSA, asthma, brother with asthma. 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 She has lived in [place] before - [place], moved to [place] 2017. No recent travel. Smoking 2003-2006, did have significant secondhand smoke exposure. No vaping.     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)   Heart murmur    OUTGREW   History of febrile seizure    History of kidney stones    History of pneumonia    Migraines    OCD (obsessive compulsive disorder)    Oral herpes    Pneumonia    PONV (postoperative nausea and vomiting)    Postpartum depression    Pre-diabetes    PTSD (post-traumatic stress disorder)    Recurrent UTI      Family History  Problem Relation Age of Onset   Depression Mother    Alcohol abuse Mother    Hernia Mother    Rashes / Skin problems Mother    Thyroid disease Mother    Anxiety disorder Mother    Depression Father    Alcohol abuse Father    Gout Father    Diabetes Father    Bipolar disorder Brother    Drug abuse Brother    ADD / ADHD Brother    ADD / ADHD Son    ADD / ADHD Son    Breast cancer Neg Hx    Ovarian cancer Neg Hx      Past Surgical History:  Procedure Laterality Date   ABDOMINAL SURGERY  2012   Endometrial Surgery - went through c-section scar   CESAREAN SECTION  2019   CESAREAN SECTION  2007   CESAREAN SECTION  2014   CESAREAN SECTION  2016   GASTROSCHISIS CLOSURE  LEEP     x 2   VENTRAL HERNIA REPAIR N/A 12/11/2019   Procedure: HERNIA REPAIR VENTRAL ADULT;  Surgeon: Carolan Shiver, MD;  Location: [facility] ORS;  Service: General;  Laterality: N/A;   WISDOM TOOTH EXTRACTION     XI ROBOTIC ASSISTED VENTRAL HERNIA N/A 12/11/2019   Procedure: XI ROBOTIC ASSISTED VENTRAL HERNIA Lap vs open;  Surgeon: Carolan Shiver, MD;  Location: [facility] OR[facility];  Service: General;  Laterality: N/A;    Social History   Socioeconomic History   Marital status: Married    Spouse name: Not on file   Number of children: 4   Years of education: Not on file   Highest education level: Not on file  Occupational History   Occupation: nurse tech    Employer: [employer]  Tobacco Use   Smoking status: Former    Packs/day: 0.30    Years: 3.00    Pack years: 0.90    Types: Cigarettes    Quit date: 2013    Years since  quitting: 9.9   Smokeless tobacco: Never  Vaping Use   Vaping Use: Never used  Substance and Sexual Activity   Alcohol use: Yes    Alcohol/week: 2.0 standard drinks    Types: 2 Cans of beer per week   Drug use: Never   Sexual activity: Yes    Birth control/protection: None  Other Topics Concern   Not on file  Social History Narrative   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 Three sons (13, 6, 4) and one daughter (1 yo). Second oldest has autism spectrum disorder and two oldest ADHD.   Right handed   Caffeine- 1 cup per day    Social Determinants of Health   Financial Resource Strain: Not on file  Food Insecurity: Not on file  Transportation Needs: Not on file  Physical Activity: Not on file  Stress: Not on file  Social Connections: Not on file  Intimate Partner Violence: Not on file     Allergies  Allergen Reactions   Other Swelling    SHELLFISH    Phenazopyridine Hives and Rash   Benadryl [Diphenhydramine]    Prednisone     Intolerant- skin feels hot     Outpatient Medications Prior to Visit  Medication Sig Dispense Refill   albuterol (VENTOLIN HFA) 108 (90 Base) MCG/ACT inhaler Inhale 2 puffs into the lungs every 6 (six) hours as needed for wheezing or shortness of breath. 18 g 0   amphetamine-dextroamphetamine (ADDERALL XR) 30 MG 24 hr capsule Take 1 capsule (30 mg total) by mouth daily. 30 capsule 0   amphetamine-dextroamphetamine (ADDERALL) 10 MG tablet Take 1 tablet (10 mg total) by mouth daily. 30 tablet 0   fluticasone (FLOVENT HFA) 110 MCG/ACT inhaler Inhale 1 puff into the lungs in the morning and at bedtime. 12 g 12   nortriptyline (PAMELOR) 25 MG capsule Take 1 capsule (25 mg total) by mouth at bedtime. 30 capsule 3   No facility-administered medications prior to visit.       Objective:   Physical Exam:  General appearance: 40 y.o., female, NAD, conversant  Eyes: anicteric sclerae; PERRL, tracking appropriately HENT:  NCAT; MMM Neck: Trachea midline; no lymphadenopathy, no JVD Lungs: CTAB, no crackles, no wheeze, with normal respiratory effort CV: RRR, no murmur  Abdomen: Soft, non-tender; non-distended, BS present  Extremities: No peripheral edema, warm Skin: Normal turgor and texture; no rash Psych: Appropriate affect Neuro: Alert and oriented to  person and place, no focal deficit     Vitals:   03/22/21 1357  BP: 112/80  Pulse: (!) 102  Temp: 98.1 F (36.7 C)  TempSrc: Oral  SpO2: 98%  Weight: 183 lb 6.4 oz (83.2 kg)  Height: 5' (1.524 m)   98% on RA BMI Readings from Last 3 Encounters:  03/22/21 35.82 kg/m  03/17/21 35.54 kg/m  02/21/21 35.54 kg/m   Wt Readings from Last 3 Encounters:  03/22/21 183 lb 6.4 oz (83.2 kg)  03/17/21 182 lb (82.6 kg)  02/21/21 182 lb (82.6 kg)     CBC    Component Value Date/Time   WBC 8.5 02/21/2021 0904   RBC 4.47 02/21/2021 0904   HGB 11.9 (L) 02/21/2021 0904   HCT 36.5 02/21/2021 0904   PLT 308.0 02/21/2021 0904   MCV 81.6 02/21/2021 0904   MCH 26.8 12/08/2020 1629   MCHC 32.5 02/21/2021 0904   RDW 14.4 02/21/2021 0904   LYMPHSABS 2.5 02/21/2021 0904   MONOABS 0.5 02/21/2021 0904   EOSABS 0.3 02/21/2021 0904   BASOSABS 0.0 02/21/2021 0904    Eos 300  12/15/20 BNP, d-dimer WNL  Chest Imaging:  CXR 03/14/21 reviewed by me unremarkable  CT stone study 12/18/19 lung bases reviewed by me unremarkable  Pulmonary Functions Testing Results: No flowsheet data found.     Assessment & Plan:   # DOE Unclear etiology. Covid-19 long haul vs asthma whether related to covid-19 or not vs untreated sleep-disordered breathing, deconditioning.  # Snoring # Paroxysmal nocturnal dyspnea # Nocturnal hypoxia Low-intermediate suspicion for sleep-disordered breathing. She's willing to undergo home sleep study, less willing to do in-lab study. Excessive daytime sleepiness may be masked by adderall use.  # Recurrent  pneumonias  Plan: -immunoglobulin levels given recurrent pneumonias, IgE given concern for asthma and pt's concern she may have allergic profile - PFTs next visit - instructed to hold flovent ideally for 2 weeks and at least 1 week prior to PFTs, albuterol for at least a couple days beforehand - home sleep study - ok to continue flovent rinsing mouth afterward, prn albuterol for now   RTC 4 weeks with PFT either myself or APP  I spent 47 minutes dedicated to the care of this patient on the date of this encounter to include pre-visit review of records, face-to-face time with the patient discussing conditions above, post visit ordering of testing, clinical documentation with the electronic health record, making appropriate referrals as documented, and communicating necessary findings to members of the patients care team.    Omar Person, MD [facility] 03/22/2021 2:03 PM

## 2021-03-23 LAB — IGE: IgE (Immunoglobulin E), Serum: 11 kU/L (ref <=114)

## 2021-03-23 LAB — IGG, IGA, IGM
IgG (Immunoglobin G), Serum: 1377 mg/dL (ref 600–1640)
IgM, Serum: 80 mg/dL (ref 50–300)
Immunoglobulin A: 220 mg/dL (ref 47–310)

## 2021-03-24 ENCOUNTER — Encounter: Payer: Self-pay | Admitting: Student

## 2021-03-24 NOTE — Telephone Encounter (Signed)
NM please advise. Thanks  

## 2021-04-01 ENCOUNTER — Encounter: Payer: Self-pay | Admitting: Family Medicine

## 2021-04-04 ENCOUNTER — Telehealth (INDEPENDENT_AMBULATORY_CARE_PROVIDER_SITE_OTHER): Payer: 59 | Admitting: Family Medicine

## 2021-04-04 ENCOUNTER — Encounter: Payer: Self-pay | Admitting: Family Medicine

## 2021-04-04 ENCOUNTER — Other Ambulatory Visit: Payer: Self-pay

## 2021-04-04 DIAGNOSIS — U071 COVID-19: Secondary | ICD-10-CM | POA: Diagnosis not present

## 2021-04-04 MED ORDER — NIRMATRELVIR/RITONAVIR (PAXLOVID)TABLET
3.0000 | ORAL_TABLET | Freq: Two times a day (BID) | ORAL | 0 refills | Status: AC
  Filled 2021-04-04: qty 30, 5d supply, fill #0

## 2021-04-04 NOTE — Progress Notes (Addendum)
Patient ID: Crystal Hammond, female    DOB: 03/02/1981, 40 y.o.   MRN: [id number]  Virtual visit completed through MyChart, a video enabled telemedicine application. Due to national recommendations of social distancing due to COVID-19, a virtual visit is felt to be most appropriate for this patient at this time. Reviewed limitations, risks, security and privacy concerns of performing a virtual visit and the availability of in person appointments. I also reviewed that there may be a patient responsible charge related to this service. The patient agreed to proceed.   Patient location: home Provider location: [facility], office Persons participating in this virtual visit: patient, provider   If any vitals were documented, they were collected by patient at home unless specified below.    Pulse (!) 111    Temp 97.9 F (36.6 C)    Ht 5' (1.524 m)    LMP 02/15/2021    SpO2 96%    BMI 35.82 kg/m    CC: COVID infection Subjective:   HPI: Crystal Hammond is a 41 y.o. female presenting on 04/04/2021 for Covid Positive (C/o ST, SOB, cough and body aches.  Sxs started 03/31/21.   Pos home COVID test-04/01/21.  Tried Dayquil- caused heartburn/indigestion. )   First day of symptoms: 03/31/2021 Tested COVID positive: 04/01/2021  Current symptoms: ST, HA, fatigue, ongoing exertional dyspnea since prior COVID infection, cough and body aches, chills, decreased taste/smell.  No: fevers, abd pain, nausea, diarrhea Treatments to date: dayquil - stopped due to indigestion.  Risk factors include: h/o recurrent pneumonias, ?asthma, depression, BMI >30  COVID vaccination status: Pfizer 08/2019, 09/2019, no boosters  Prior COVID infection x2, latest 11/2020 complicated by pneumonia, with residual symptoms of exertional dyspnea, established with [facility] treated with albuterol PRN, with planned PFTs for next month. Previous prednisone didn't help dyspnea, but worsened rosacea.  She is CNA.       Relevant past medical, surgical, family and social history reviewed and updated as indicated. Interim medical history since our last visit reviewed. Allergies and medications reviewed and updated. Outpatient Medications Prior to Visit  Medication Sig Dispense Refill   albuterol (VENTOLIN HFA) 108 (90 Base) MCG/ACT inhaler Inhale 2 puffs into the lungs every 6 (six) hours as needed for wheezing or shortness of breath. 18 g 0   amphetamine-dextroamphetamine (ADDERALL XR) 30 MG 24 hr capsule Take 1 capsule (30 mg total) by mouth daily. 30 capsule 0   amphetamine-dextroamphetamine (ADDERALL) 10 MG tablet Take 1 tablet (10 mg total) by mouth daily. 30 tablet 0   fluticasone (FLOVENT HFA) 110 MCG/ACT inhaler Inhale 1 puff into the lungs in the morning and at bedtime. 12 g 12   nortriptyline (PAMELOR) 25 MG capsule Take 1 capsule (25 mg total) by mouth at bedtime. 30 capsule 3   No facility-administered medications prior to visit.     Per HPI unless specifically indicated in ROS section below Review of Systems Objective:  Pulse (!) 111    Temp 97.9 F (36.6 C)    Ht 5' (1.524 m)    LMP 02/15/2021    SpO2 96%    BMI 35.82 kg/m   Wt Readings from Last 3 Encounters:  03/22/21 183 lb 6.4 oz (83.2 kg)  03/17/21 182 lb (82.6 kg)  02/21/21 182 lb (82.6 kg)       Physical exam: Gen: alert, NAD, not ill appearing Pulm: speaks in complete sentences without increased work of breathing Psych: normal mood, normal thought content  Results for orders placed or performed in visit on 03/22/21  IgE  Result Value Ref Range   IgE (Immunoglobulin E), Serum 11 <OR=114 kU/L  IgG, IgA, IgM  Result Value Ref Range   Immunoglobulin A 220 47 - 310 mg/dL   IgG (Immunoglobin G), Serum 1,377 600 - 1,640 mg/dL   IgM, Serum 80 50 - 786 mg/dL   Lab Results  Component Value Date   CREATININE 0.65 02/21/2021   BUN 8 02/21/2021   NA 137 02/21/2021   K 3.9 02/21/2021   CL 102 02/21/2021   CO2 29  02/21/2021  GFR = 111  Assessment & Plan:   Problem List Items Addressed This Visit     COVID-19 virus infection    Reviewed currently approved EUA treatments.  Reviewed expected course of illness, anticipated course of recovery, as well as red flags to suggest COVID pneumonia and/or to seek urgent in-person care. Reviewed latest CDC isolation/quarantine guidelines.  Encouraged fluids and rest. Reviewed further supportive care measures at home including vit C 500mg  bid, vit D 2000 IU daily, zinc 100mg  daily, tylenol PRN, pepcid 20mg  BID PRN.  Discussed ok to use albuterol inhaler PRN.  Recommend:  Full dose paxlovid Paxlovid drug interactions:  Flovent - likely no clinically relevant interaction however according to NIH drug-drug interaction guide.   Advised she contact pulm to notify them about recurrent COVID infection as they may want to postpone PFTs scheduled for Jan 2023.       Relevant Medications   nirmatrelvir/ritonavir EUA (PAXLOVID) 20 x 150 MG & 10 x 100MG  TABS     Meds ordered this encounter  Medications   nirmatrelvir/ritonavir EUA (PAXLOVID) 20 x 150 MG & 10 x 100MG  TABS    Sig: Take 3 tablets by mouth 2 (two) times daily for 5 days. (Take nirmatrelvir 150 mg two tablets twice daily for 5 days and ritonavir 100 mg one tablet twice daily for 5 days) Patient GFR is 111    Dispense:  30 tablet    Refill:  0   No orders of the defined types were placed in this encounter.   I discussed the assessment and treatment plan with the patient. The patient was provided an opportunity to ask questions and all were answered. The patient agreed with the plan and demonstrated an understanding of the instructions. The patient was advised to call back or seek an in-person evaluation if the symptoms worsen or if the condition fails to improve as anticipated.  Follow up plan: No follow-ups on file.  , MD

## 2021-04-04 NOTE — Assessment & Plan Note (Addendum)
Reviewed currently approved EUA treatments.  Reviewed expected course of illness, anticipated course of recovery, as well as red flags to suggest COVID pneumonia and/or to seek urgent in-person care. Reviewed latest CDC isolation/quarantine guidelines.  Encouraged fluids and rest. Reviewed further supportive care measures at home including vit C 500mg  bid, vit D 2000 IU daily, zinc 100mg  daily, tylenol PRN, pepcid 20mg  BID PRN.  Discussed ok to use albuterol inhaler PRN.  Recommend:  Full dose paxlovid Paxlovid drug interactions:  Flovent - likely no clinically relevant interaction however according to NIH drug-drug interaction guide.   Advised she contact pulm to notify them about recurrent COVID infection as they may want to postpone PFTs scheduled for Jan 2023.

## 2021-04-06 ENCOUNTER — Ambulatory Visit: Payer: 59 | Admitting: Psychology

## 2021-04-19 ENCOUNTER — Ambulatory Visit (INDEPENDENT_AMBULATORY_CARE_PROVIDER_SITE_OTHER): Payer: 59 | Admitting: Primary Care

## 2021-04-19 ENCOUNTER — Other Ambulatory Visit: Payer: Self-pay

## 2021-04-19 ENCOUNTER — Encounter: Payer: Self-pay | Admitting: Primary Care

## 2021-04-19 ENCOUNTER — Ambulatory Visit (INDEPENDENT_AMBULATORY_CARE_PROVIDER_SITE_OTHER): Payer: 59 | Admitting: Student

## 2021-04-19 VITALS — BP 100/60 | HR 102 | Temp 98.2°F | Ht 60.0 in | Wt 186.0 lb

## 2021-04-19 DIAGNOSIS — Z9189 Other specified personal risk factors, not elsewhere classified: Secondary | ICD-10-CM | POA: Diagnosis not present

## 2021-04-19 DIAGNOSIS — R0609 Other forms of dyspnea: Secondary | ICD-10-CM

## 2021-04-19 DIAGNOSIS — R0602 Shortness of breath: Secondary | ICD-10-CM

## 2021-04-19 DIAGNOSIS — J4520 Mild intermittent asthma, uncomplicated: Secondary | ICD-10-CM

## 2021-04-19 LAB — PULMONARY FUNCTION TEST
DL/VA % pred: 113 %
DL/VA: 5.24 ml/min/mmHg/L
DLCO cor % pred: 95 %
DLCO cor: 18.35 ml/min/mmHg
DLCO unc % pred: 91 %
DLCO unc: 17.44 ml/min/mmHg
FEF 25-75 Post: 2.92 L/sec
FEF 25-75 Pre: 2.64 L/sec
FEF2575-%Change-Post: 10 %
FEF2575-%Pred-Post: 98 %
FEF2575-%Pred-Pre: 88 %
FEV1-%Change-Post: 1 %
FEV1-%Pred-Post: 79 %
FEV1-%Pred-Pre: 78 %
FEV1-Post: 2.15 L
FEV1-Pre: 2.11 L
FEV1FVC-%Change-Post: 2 %
FEV1FVC-%Pred-Pre: 103 %
FEV6-%Change-Post: -1 %
FEV6-%Pred-Post: 76 %
FEV6-%Pred-Pre: 76 %
FEV6-Post: 2.43 L
FEV6-Pre: 2.46 L
FEV6FVC-%Change-Post: 0 %
FEV6FVC-%Pred-Post: 101 %
FEV6FVC-%Pred-Pre: 101 %
FVC-%Change-Post: -1 %
FVC-%Pred-Post: 75 %
FVC-%Pred-Pre: 75 %
FVC-Post: 2.43 L
FVC-Pre: 2.46 L
Post FEV1/FVC ratio: 88 %
Post FEV6/FVC ratio: 100 %
Pre FEV1/FVC ratio: 86 %
Pre FEV6/FVC Ratio: 100 %
RV % pred: 79 %
RV: 1.07 L
TLC % pred: 80 %
TLC: 3.57 L

## 2021-04-19 LAB — NITRIC OXIDE: FeNO level (ppb): 15

## 2021-04-19 NOTE — Progress Notes (Signed)
PFT done today. 

## 2021-04-19 NOTE — Assessment & Plan Note (Signed)
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 No evidence of obstructive lung disease which would be seen in asthma or COPD. Causes of restrictive lung disease can be interstitial but there is not evidence of this because diffusion capacity is normal and chest xray showed clear lungs. Other causes can be obesity, kyphosis or neuromuscular but she has no muscule weakness so this is unlikely. I do want to work up the mild right hemidiaphragm elevation seen on CXR as this can cause shortness of breath. If diaphragm is paralysis she may be aspirating. Advised patient she can stop Flovent as there was no clinical benefit form [place]. Ok to continue Albuterol rescue inhaler 2 puffs every 4-6 hours as needed for shortness of breath or wheezing. Other testing we can consider if needed in future if dyspnea persists are CT chest and/or echocardiogram   Orders: Sniff test re: shortness of breath  Follow-up: First available with Dr. Thora Lance (end of January he has openings)

## 2021-04-19 NOTE — Assessment & Plan Note (Signed)
-   Awaiting Home sleep study to be completed

## 2021-04-19 NOTE — Patient Instructions (Addendum)
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 No evidence of obstructive lung disease which would be seen in asthma or COPD. Causes of restrictive lung disease can be interstitial but there is not evidence of this because your diffusion capacity is normal and chest xray showed clear lungs.  Other causes can be extrathoracic such as obesity or kyphosis. Lastly is neuromuscular but you have no muscule weakness so this is unlikely.   I do want to work up the mild right hemidiaphragm elevation seen on CXR as this can cause shortness of breath. We also need to have home sleep study done to rule in/out sleep apnea which cause cause underlying conditions such as pulmonary hypertension which can also cause shortness of breath  You can stop Flovent as you saw no clinical benefit. Continue Albuterol rescue inhaler 2 puffs every 4-6 hours as needed for shortness of breath or wheezing   Other testing we can consider if needed in future if dyspnea persists are CT chest and/or echocardiogram   Orders: Sniff test re: shortness of breath  Follow-up: First available with Dr. Verlee Monte (end of January he has openings)

## 2021-04-19 NOTE — Progress Notes (Signed)
 @Patient  ID: Crystal Hammond, female    DOB: 10/09/1980, 41 y.o.   MRN: [id number]  Chief Complaint  Patient presents with   Follow-up    Pft results    Referring provider: Joaquim Namuncan, Graham S, MD  HPI: 41 year old female, former smoker. PMH significant for dyspnea on exertion, COVID-19, ADHD, pre-diabetes. Patient of Dr. Thora LanceMeier, seen for initial consult on 03/22/21.   Previous [facility] pulmonary encounter: 03/22/21- Dr. Thora LanceMeier  40yF with history of anxiety, endometriosis, covid-19 infection 11/22/20 for second time who is referred by PCP for dyspnea. She had been vaccinated for covid-19.   Had bout of pneumonia after her second covid-19 infection. Never hospitalized.   She has had several admissions in past for pneumonia, including as a child.  Some suspicion for asthma, started on flovent 1 puff BID - has been on it for a few days. Redid carpeting, isolated pets (dogs, cats) in bottom of house. She doesn't have much of a cough. She does have constant central chest burning sensation, not necessarily with deep breath. ALbuterol seems to be the only medication that's helped so far. No associated throat tightness, hoarseness. She doesn't have overtly symptomatic reflux. She does have some post-nasal drip.  Prednisone worsened her rosacea substantially and she didn't notice if it made any difference with her breathing.   Has had some low oxygen saturation at night when she's checked it (as low as 85-88%).   Weight has been stable over years.   She snores, perhaps loud enough to hear through a door. She doesn't truly seem to have excessive daytime sleepiness. She has had a couple episodes of PND.  Otherwise pertinent review of systems is negative.  Son has OSA, asthma, brother with asthma. 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 She has lived in [place], moved to [place]. No recent travel. Smoking 2003-2006, did have significant secondhand smoke  exposure. No vaping.   04/19/2021- Interim hx  Patient presents today for follow-up dyspnea on exertion. She has had shortness of breath symptoms since she was a child. This particularly worsened since having covid for the second time back in August 2022. Since then she got covid for a third time in December. She has associated chest burning/tightness and intermittent cough. She has not noticed any clinical benefit from flovent. She does see some improvement when using albuterol. Weather and temperature worse her symptoms. She has hx re-occuring upper respiratory infections/pneumonia. She does report coughing after eating at times. Her weight has been stable over the last several years. PFTs were completed today which showed mild restriction with normal diffusion capacity.  Pulmonary function testing 04/19/20 FVC 2.43 (75%), FEV1 2.15 (79%), ratio 88, TLC 80%, DLCOcor 18.35 (95%) / Mild restriction without diffusion defect. No BD response   04/19/2021 FENO >> 15   Imaging: 03/14/21 CXR >> Midline trachea. Normal heart size and mediastinal contours. No pleural effusion or pneumothorax. Mild right hemidiaphragm elevation. Clear lungs  Labs: 03/22/21 IGE>> 11  02/21/21 CBC>> Hgb 11.9  Allergies  Allergen Reactions   Other Swelling    SHELLFISH    Phenazopyridine Hives and Rash   Benadryl [Diphenhydramine]    Prednisone     Intolerant- skin feels hot    Immunization History  Administered Date(s) Administered   Hepatitis B 04/27/2020, 05/31/2020   Hepatitis B, adult 01/13/2021   Influenza,inj,Quad PF,6+ Mos 01/31/2016, 05/27/2017, 12/31/2017, 01/23/2019, 01/25/2020, 01/13/2021   PFIZER(Purple  Top)SARS-COV-2 Vaccination 09/01/2019, 09/22/2019   Tdap 10/11/2017    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)   Heart murmur    OUTGREW   History of  febrile seizure    History of kidney stones    History of pneumonia    Migraines    OCD (obsessive compulsive disorder)    Oral herpes    Pneumonia    PONV (postoperative nausea and vomiting)    Postpartum depression    Pre-diabetes    PTSD (post-traumatic stress disorder)    Recurrent UTI     Tobacco History: Social History   Tobacco Use  Smoking Status Former   Packs/day: 0.30   Years: 3.00   Pack years: 0.90   Types: Cigarettes   Quit date: 2013   Years since quitting: 10.0  Smokeless Tobacco Never   Counseling given: Not Answered   Outpatient Medications Prior to Visit  Medication Sig Dispense Refill   albuterol (VENTOLIN HFA) 108 (90 Base) MCG/ACT inhaler Inhale 2 puffs into the lungs every 6 (six) hours as needed for wheezing or shortness of breath. 18 g 0   amphetamine-dextroamphetamine (ADDERALL XR) 30 MG 24 hr capsule Take 1 capsule (30 mg total) by mouth daily. 30 capsule 0   amphetamine-dextroamphetamine (ADDERALL) 10 MG tablet Take 1 tablet (10 mg total) by mouth daily. 30 tablet 0   fluticasone (FLOVENT HFA) 110 MCG/ACT inhaler Inhale 1 puff into the lungs in the morning and at bedtime. 12 g 12   nortriptyline (PAMELOR) 25 MG capsule Take 1 capsule (25 mg total) by mouth at bedtime. 30 capsule 3   No facility-administered medications prior to visit.   Review of Systems  Review of Systems  Constitutional: Negative.   HENT: Negative.    Respiratory:  Positive for cough, chest tightness and shortness of breath.   Cardiovascular: Negative.     Physical Exam  BP 100/60 (BP Location: Left Arm, Cuff Size: Normal)    Pulse (!) 102    Temp 98.2 F (36.8 C) (Oral)    Ht 5' (1.524 m)    Wt 186 lb (84.4 kg)    SpO2 99%    BMI 36.33 kg/m  Physical Exam Constitutional:      General: She is not in acute distress.    Appearance: Normal appearance. She is obese. She is not ill-appearing.  HENT:     Head: Normocephalic and atraumatic.     Mouth/Throat:      Mouth: Mucous membranes are moist.     Pharynx: Oropharynx is clear.  Cardiovascular:     Rate and Rhythm: Normal rate and regular rhythm.  Pulmonary:     Effort: Pulmonary effort is normal.     Breath sounds: Normal breath sounds. No wheezing, rhonchi or rales.  Musculoskeletal:        General: Normal range of motion.     Cervical back: Normal range of motion and neck supple.  Skin:    General: Skin is warm and dry.  Neurological:     General: No focal deficit present.     Mental Status: She is alert and oriented to person, place, and time. Mental status is at baseline.  Psychiatric:        Mood and Affect: Mood normal.        Behavior:  Behavior normal.        Thought Content: Thought content normal.        Judgment: Judgment normal.     Lab Results:  CBC    Component Value Date/Time   WBC 8.5 02/21/2021 0904   RBC 4.47 02/21/2021 0904   HGB 11.9 (L) 02/21/2021 0904   HCT 36.5 02/21/2021 0904   PLT 308.0 02/21/2021 0904   MCV 81.6 02/21/2021 0904   MCH 26.8 12/08/2020 1629   MCHC 32.5 02/21/2021 0904   RDW 14.4 02/21/2021 0904   LYMPHSABS 2.5 02/21/2021 0904   MONOABS 0.5 02/21/2021 0904   EOSABS 0.3 02/21/2021 0904   BASOSABS 0.0 02/21/2021 0904    BMET    Component Value Date/Time   NA 137 02/21/2021 0904   K 3.9 02/21/2021 0904   CL 102 02/21/2021 0904   CO2 29 02/21/2021 0904   GLUCOSE 94 02/21/2021 0904   BUN 8 02/21/2021 0904   CREATININE 0.65 02/21/2021 0904   CALCIUM 9.1 02/21/2021 0904   GFRNONAA >60 12/08/2020 1629   GFRAA >60 12/27/2019 1757    BNP No results found for: BNP  ProBNP    Component Value Date/Time   PROBNP 13.0 12/15/2020 1545    Imaging: No results found.   Assessment & Plan:   SOBOE (shortness of breath on exertion) Pulmonary function testing showed mild restrictive lung disease. No evidence of obstructive lung disease which would be seen in asthma or COPD. Causes of restrictive lung disease can be interstitial but  there is not evidence of this because diffusion capacity is normal and chest xray showed clear lungs. Other causes can be obesity, kyphosis or neuromuscular but she has no muscule weakness so this is unlikely. I do want to work up the mild right hemidiaphragm elevation seen on CXR as this can cause shortness of breath. If diaphragm is paralysis she may be aspirating. Advised patient she can stop Flovent as there was no clinical benefit form [place]. Ok to continue Albuterol rescue inhaler 2 puffs every 4-6 hours as needed for shortness of breath or wheezing. Other testing we can consider if needed in future if dyspnea persists are CT chest and/or echocardiogram   Orders: Sniff test re: shortness of breath  Follow-up: First available with Dr. Thora Lance (end of January he has openings)   At risk for sleep apnea - Awaiting Home sleep study to be completed   40 mins spent on case: > 50% face to face  Glenford Bayley, NP 04/19/2021

## 2021-04-20 ENCOUNTER — Other Ambulatory Visit: Payer: Self-pay | Admitting: Family Medicine

## 2021-04-21 ENCOUNTER — Other Ambulatory Visit: Payer: Self-pay

## 2021-04-21 ENCOUNTER — Other Ambulatory Visit: Payer: Self-pay | Admitting: Family Medicine

## 2021-04-21 NOTE — Telephone Encounter (Signed)
Refill request for amphetamine-dextroamphetamine (ADDERALL XR) 30 MG 24 hr capsule  LOV - 04/04/21 Next OV - 06/02/21 Last refill - 02/08/21 #30/0

## 2021-04-21 NOTE — Telephone Encounter (Signed)
Duplicate request

## 2021-04-23 MED FILL — Amphetamine-Dextroamphetamine Cap ER 24HR 30 MG: ORAL | 30 days supply | Qty: 30 | Fill #0 | Status: AC

## 2021-04-24 ENCOUNTER — Other Ambulatory Visit: Payer: Self-pay

## 2021-04-24 ENCOUNTER — Ambulatory Visit: Payer: 59

## 2021-05-03 ENCOUNTER — Encounter: Payer: Self-pay | Admitting: *Deleted

## 2021-05-03 DIAGNOSIS — Z0182 Encounter for allergy testing: Secondary | ICD-10-CM

## 2021-05-07 IMAGING — CT CT RENAL STONE PROTOCOL
2 of 4 series · 16 of 46 positions shown, 18 images · non-contrast
Comparison: 11/25/2019 CT

CLINICAL DATA: 39-year-old female with recent ventral hernia
surgery 1 week ago. Continued abdominal pain and increased drainage
from suction drain.

EXAM:
CT ABDOMEN AND PELVIS WITHOUT CONTRAST
TECHNIQUE: Multidetector CT imaging of the abdomen and pelvis was performed
following the standard protocol without IV contrast.

[Series 2: stone full standard · axial · 0.64mm/px · z∈[-322,+58]mm · 13 of 84 slices shown, 15 images]
[im 4/84  soft-tissue]
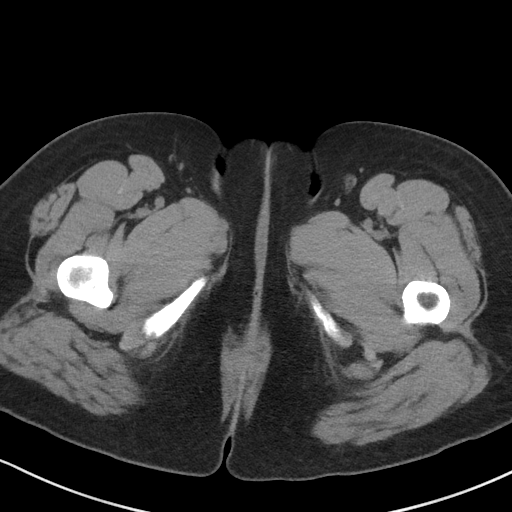
[im 4/84  bone]
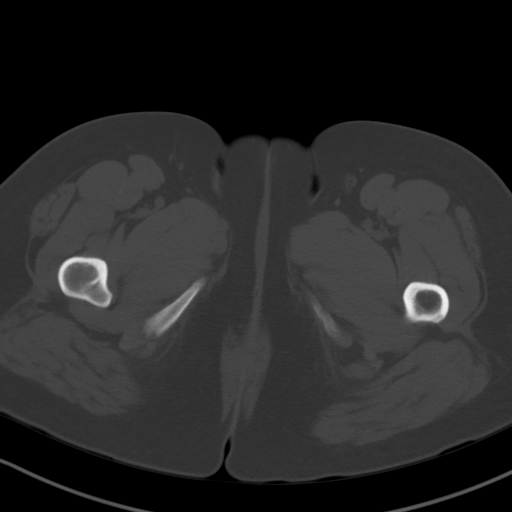
[im 11/84  soft-tissue]
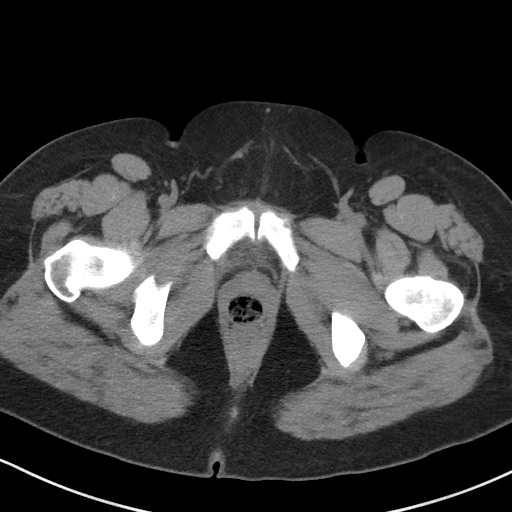
[im 18/84  soft-tissue]
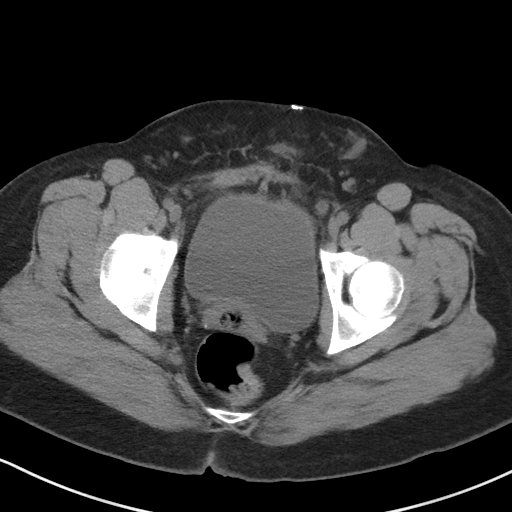
[im 25/84  soft-tissue]
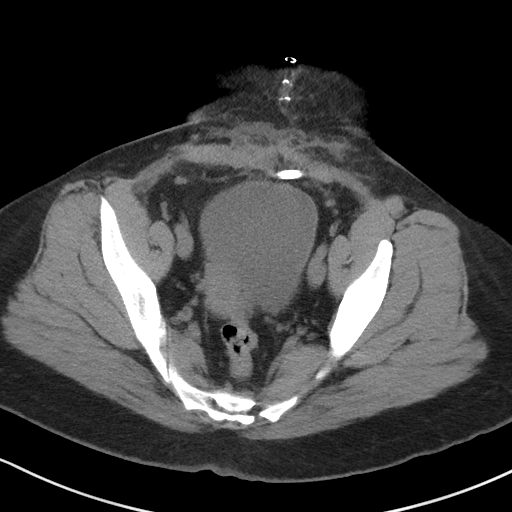
[im 28/84  soft-tissue]
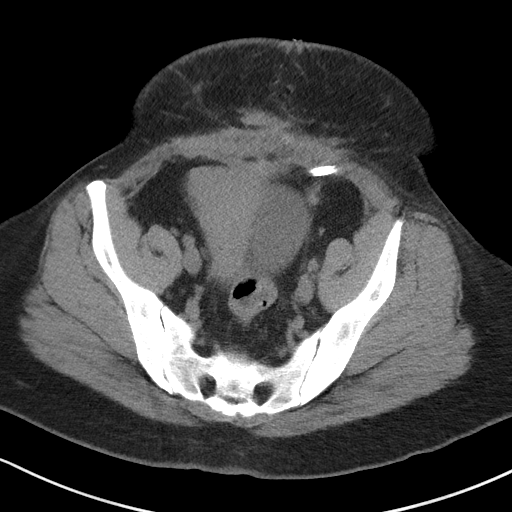
[im 35/84  soft-tissue]
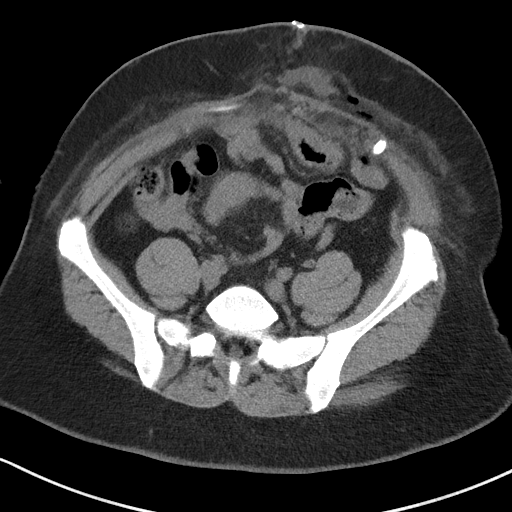
[im 42/84  soft-tissue]
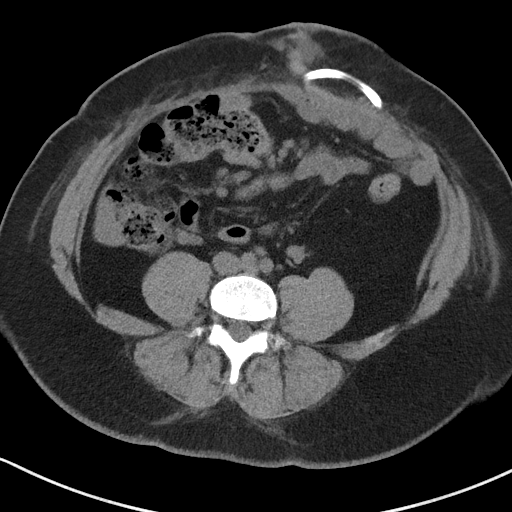
[im 49/84  soft-tissue]
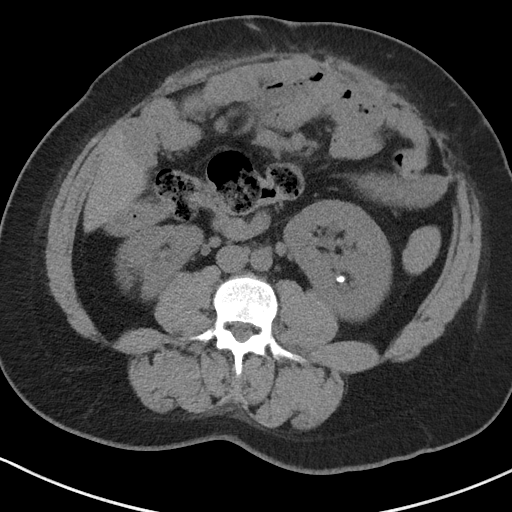
[im 56/84  soft-tissue]
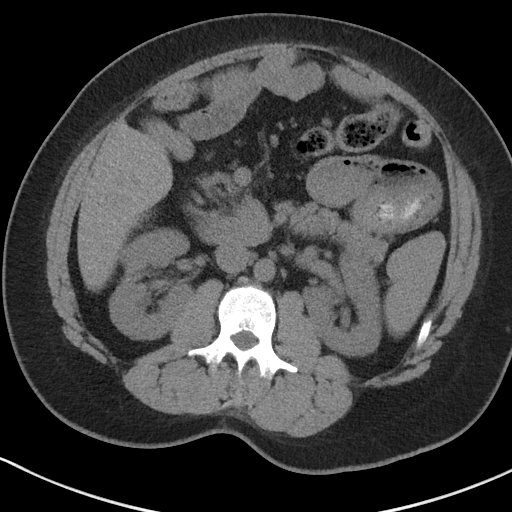
[im 56/84  bone]
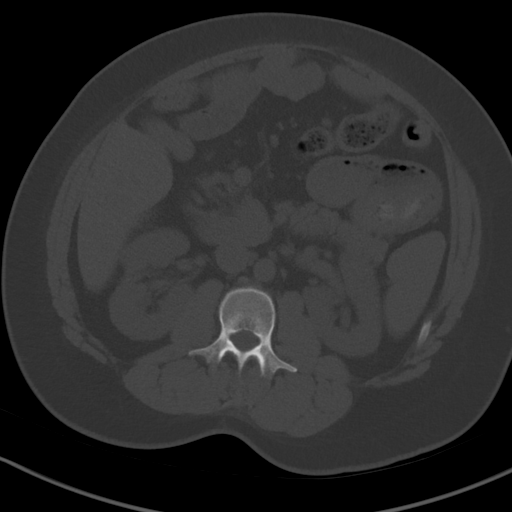
[im 59/84  soft-tissue]
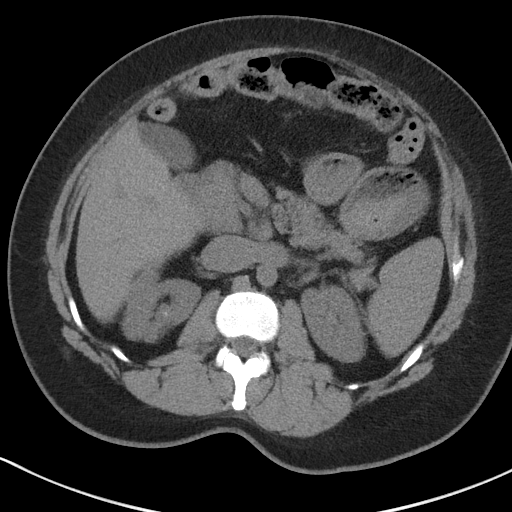
[im 66/84  soft-tissue]
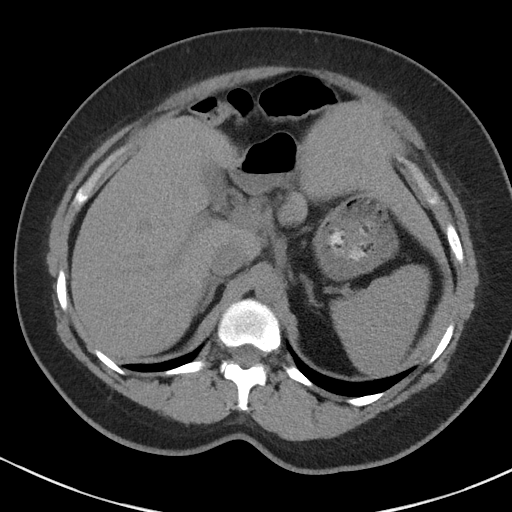
[im 73/84  soft-tissue]
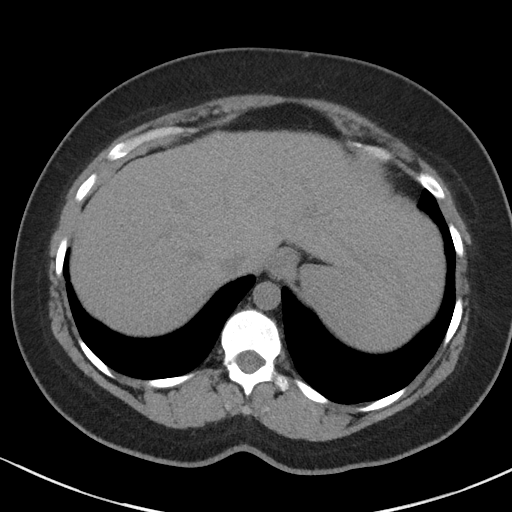
[im 80/84  soft-tissue]
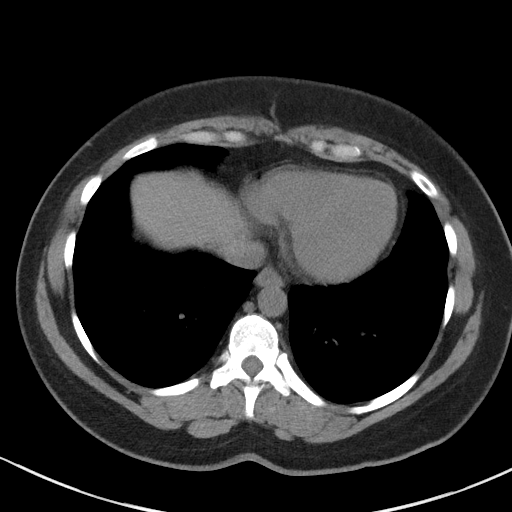

[Series 5: coronal · coronal · 0.81mm/px · 3 of 153 slices shown]
[im 51/153  soft-tissue]
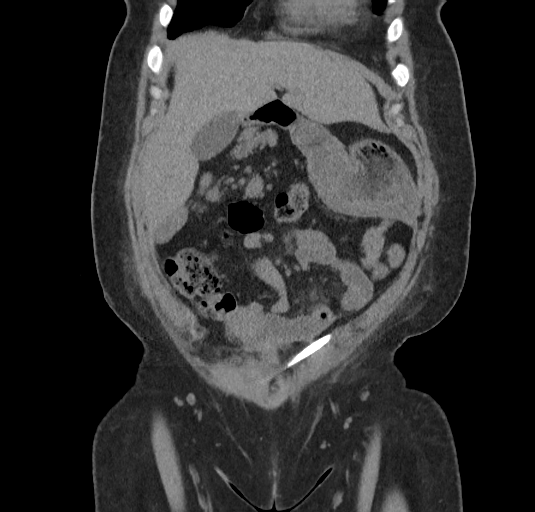
[im 68/153  soft-tissue]
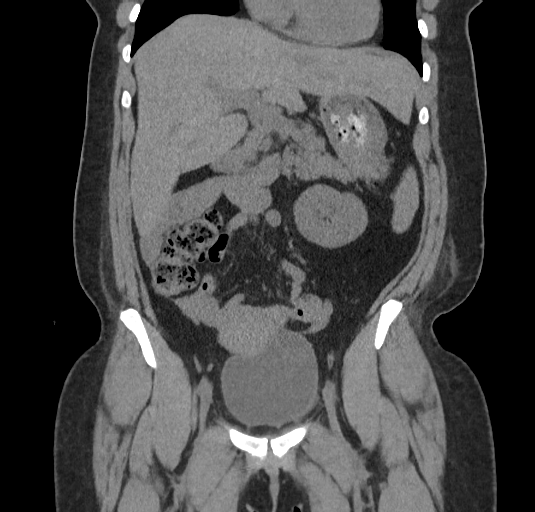
[im 85/153  soft-tissue]
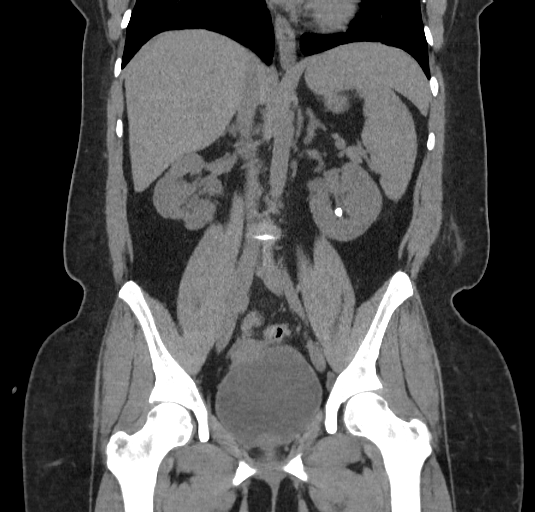

[16 of 46 positions shown; findings below may reference images not displayed]

FINDINGS: Please note that parenchymal abnormalities may be missed without
intravenous contrast.

Lower chest: No acute abnormality

Hepatobiliary: The liver and gallbladder are unremarkable. No
biliary dilatation.

Pancreas: Unremarkable

Spleen: Unremarkable

Adrenals/Urinary Tract: Punctate nonobstructing bilateral renal
calculi are again noted as well as a 6 mm mid LEFT renal calculus.
There is no evidence of hydronephrosis or ureteral calculi.

The adrenal glands and bladder are unremarkable.

Stomach/Bowel: Stomach is within normal limits. No evidence of bowel
wall thickening, distention, or inflammatory changes.

Vascular/Lymphatic: No significant vascular findings are present.
Mildly prominent mesenteric lymph nodes are unchanged and may be
reactive.

Reproductive: Uterus and bilateral adnexa are unremarkable.

Other: Ventral hernia repair changes and drain are noted within the
anterior abdomen/pelvis without definite recurrent hernia. Stranding
and small foci of gas within the anterior subcutaneous tissues are
noted. No discrete focal collection/abscess is identified.

There is no evidence of ascites or pneumoperitoneum.

Musculoskeletal: No acute or suspicious bony abnormalities
identified.
IMPRESSION: 1. Ventral hernia repair changes and drain within the anterior
abdomen/pelvis without definite recurrent hernia. Stranding and
small foci of gas within the anterior subcutaneous tissues may be
related to recent surgical changes but correlate clinically with
infection. No discrete abscess.
2. Nonobstructing bilateral renal calculi.

## 2021-05-12 ENCOUNTER — Other Ambulatory Visit: Payer: Self-pay

## 2021-05-24 ENCOUNTER — Encounter: Payer: Self-pay | Admitting: Psychiatry

## 2021-05-24 ENCOUNTER — Other Ambulatory Visit: Payer: Self-pay

## 2021-05-24 ENCOUNTER — Ambulatory Visit: Payer: 59 | Admitting: Psychiatry

## 2021-05-24 VITALS — BP 122/72 | HR 76 | Ht 60.0 in | Wt 185.0 lb

## 2021-05-24 DIAGNOSIS — G43109 Migraine with aura, not intractable, without status migrainosus: Secondary | ICD-10-CM | POA: Diagnosis not present

## 2021-05-24 MED ORDER — NORTRIPTYLINE HCL 25 MG PO CAPS
25.0000 mg | ORAL_CAPSULE | Freq: Every day | ORAL | 3 refills | Status: DC
  Filled 2021-05-24: qty 30, 30d supply, fill #0
  Filled 2021-07-03: qty 30, 30d supply, fill #1
  Filled 2021-08-16: qty 30, 30d supply, fill #2
  Filled 2021-09-17: qty 30, 30d supply, fill #3

## 2021-05-24 MED ORDER — ZOLMITRIPTAN 5 MG PO TABS
5.0000 mg | ORAL_TABLET | ORAL | 3 refills | Status: DC | PRN
  Filled 2021-05-24: qty 9, 28d supply, fill #0

## 2021-05-24 MED ORDER — PROPRANOLOL HCL 20 MG PO TABS
20.0000 mg | ORAL_TABLET | Freq: Two times a day (BID) | ORAL | 3 refills | Status: DC
  Filled 2021-05-24: qty 60, 30d supply, fill #0
  Filled 2021-07-03: qty 60, 30d supply, fill #1
  Filled 2021-08-22: qty 60, 30d supply, fill #2
  Filled 2021-09-17: qty 60, 30d supply, fill #3

## 2021-05-24 MED ORDER — PROPRANOLOL HCL 20 MG PO TABS
20.0000 mg | ORAL_TABLET | Freq: Three times a day (TID) | ORAL | 3 refills | Status: DC
  Filled 2021-05-24: qty 60, 20d supply, fill #0

## 2021-05-24 NOTE — Progress Notes (Signed)
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  At her last visit she was started on nortriptyline for headache prevention and Zomig nasal spray for rescue.  Interval History: Headaches have improved slightly since her last visit. She is continuing to get daily headaches but is having migraines less frequently. She went from 2-3 migraines per month to 1-2 per month.  She is tolerating nortriptyline well without side effects. Ran out a couple of days ago and has noticed her headaches starting to get worse.  Imitrex caused dizziness and cognitive changes. She is just taking Tylenol for rescue currently.  Headache days per month: 30 Headache free days per month: 0  Current Headache Regimen: Preventative: nortriptyline 25 mg QHS Abortive: tylenol   Prior Therapies                                  Sumatriptan 50 mg PRN - drowsiness, dizziness Zomig Flexeril 5 mg PRN Nortriptyline 25 mg QHS Topamax contraindicated due to kidney stones  Physical Exam:   Vital Signs: BP 122/72    Pulse 76    Ht 5' (1.524 m)    Wt 185 lb (83.9 kg)    BMI 36.13 kg/m  GENERAL:  well appearing, in no acute distress, alert  SKIN:  Color, texture, turgor normal. No rashes or lesions HEAD:  Normocephalic/atraumatic. RESP: normal respiratory effort MSK:  No gross joint deformities.   NEUROLOGICAL: Mental Status: Alert, oriented to person, place and time, Follows commands, and Speech fluent and appropriate. Cranial Nerves: PERRL, face symmetric, no dysarthria, hearing grossly intact Motor: moves all extremities equally Gait: normal-based.  IMPRESSION: 41 year old female with a history of GAD, ADHD, prediabetes, kidney stones who presents for follow up of migraines. She has had only mild improvement since starting daily nortriptyline. She would prefer  to continue nortriptyline at this time. Will add propranolol for headache prevention. Zomig nasal spray denied, will attempt to get Zomig tablet approved by insurance for rescue.  PLAN: -Prevention: Continue nortriptyline 25 mg QHS. Add propranolol 20 mg BID -Rescue: Start Zomig 5 mg PRN -next steps: consider weaning nortriptyline if able to control headaches with propranolol   Follow-up: 3 months  I spent a total of 21 minutes on the date of the service. Headache education was done. Discussed treatment options including preventive and acute medications. Discussed medication side effects, adverse reactions and drug interactions. Written educational materials and patient instructions outlining all of the above were given.  Genia Harold, MD 05/24/21 9:19 AM

## 2021-05-24 NOTE — Patient Instructions (Addendum)
Start propranolol 20 mg twice a day Continue nortriptyline 25 mg at bedtime Start Zomig as needed for migraine. Take at the onset of migraine. If headache recurs or does not fully resolve, you may take a second dose after 2 hours. Please avoid taking more than 2 days per week

## 2021-05-24 NOTE — Telephone Encounter (Addendum)
Denied on April 25, 2021 This request has not been approved. We were asked to perform a prior authorization for coverage of the drug or product listed at the top of this letter under your pharmacy benefit. We denied this request because your plan does not allow certain drugs or classes of drugs to be covered under your pharmacy benefit.

## 2021-05-26 NOTE — Telephone Encounter (Signed)
Pt would like a referral for allergy skin testing - has been seen by Dr Gwen Pounds at [facility] in [place].

## 2021-05-28 NOTE — Telephone Encounter (Signed)
Referral placed  Thanks!

## 2021-06-02 ENCOUNTER — Encounter: Payer: 59 | Admitting: Family Medicine

## 2021-06-12 ENCOUNTER — Telehealth: Payer: Self-pay | Admitting: Primary Care

## 2021-06-23 ENCOUNTER — Ambulatory Visit: Payer: 59

## 2021-06-23 ENCOUNTER — Other Ambulatory Visit: Payer: Self-pay

## 2021-06-23 DIAGNOSIS — G4733 Obstructive sleep apnea (adult) (pediatric): Secondary | ICD-10-CM

## 2021-06-23 DIAGNOSIS — R0683 Snoring: Secondary | ICD-10-CM

## 2021-06-28 DIAGNOSIS — G4733 Obstructive sleep apnea (adult) (pediatric): Secondary | ICD-10-CM | POA: Diagnosis not present

## 2021-06-29 ENCOUNTER — Encounter: Payer: Self-pay | Admitting: Family Medicine

## 2021-06-30 ENCOUNTER — Other Ambulatory Visit: Payer: Self-pay

## 2021-06-30 ENCOUNTER — Other Ambulatory Visit: Payer: Self-pay | Admitting: Family Medicine

## 2021-06-30 MED ORDER — VALACYCLOVIR HCL 1 G PO TABS
2000.0000 mg | ORAL_TABLET | Freq: Two times a day (BID) | ORAL | 0 refills | Status: AC
  Filled 2021-06-30: qty 20, 30d supply, fill #0

## 2021-07-03 ENCOUNTER — Other Ambulatory Visit: Payer: Self-pay

## 2021-07-07 ENCOUNTER — Ambulatory Visit (INDEPENDENT_AMBULATORY_CARE_PROVIDER_SITE_OTHER): Payer: 59 | Admitting: Family Medicine

## 2021-07-07 ENCOUNTER — Other Ambulatory Visit: Payer: Self-pay

## 2021-07-07 ENCOUNTER — Encounter: Payer: Self-pay | Admitting: Family Medicine

## 2021-07-07 VITALS — BP 116/70 | HR 95 | Temp 98.4°F | Ht 60.0 in | Wt 189.0 lb

## 2021-07-07 DIAGNOSIS — Z0000 Encounter for general adult medical examination without abnormal findings: Secondary | ICD-10-CM

## 2021-07-07 DIAGNOSIS — F909 Attention-deficit hyperactivity disorder, unspecified type: Secondary | ICD-10-CM

## 2021-07-07 DIAGNOSIS — Z7189 Other specified counseling: Secondary | ICD-10-CM

## 2021-07-07 DIAGNOSIS — G5600 Carpal tunnel syndrome, unspecified upper limb: Secondary | ICD-10-CM

## 2021-07-07 DIAGNOSIS — G4733 Obstructive sleep apnea (adult) (pediatric): Secondary | ICD-10-CM

## 2021-07-07 DIAGNOSIS — G43109 Migraine with aura, not intractable, without status migrainosus: Secondary | ICD-10-CM

## 2021-07-07 DIAGNOSIS — F32A Depression, unspecified: Secondary | ICD-10-CM

## 2021-07-07 MED ORDER — AMPHETAMINE-DEXTROAMPHET ER 30 MG PO CP24
ORAL_CAPSULE | ORAL | 0 refills | Status: DC
  Filled 2021-07-07: qty 30, 30d supply, fill #0

## 2021-07-07 MED ORDER — AMPHETAMINE-DEXTROAMPHETAMINE 10 MG PO TABS
10.0000 mg | ORAL_TABLET | Freq: Every day | ORAL | 0 refills | Status: DC
  Filled 2021-07-07: qty 30, 30d supply, fill #0

## 2021-07-07 NOTE — Progress Notes (Signed)
This visit occurred during the SARS-CoV-2 public health emergency.  Safety protocols were in place, including screening questions prior to the visit, additional usage of staff PPE, and extensive cleaning of exam room while observing appropriate contact time as indicated for disinfecting solutions. ? ?CPE- See plan.  Routine anticipatory guidance given to patient.  See health maintenance.  The possibility exists that previously documented standard health maintenance information may have been brought forward from a previous encounter into this note.  If needed, that same information has been updated to reflect the current situation based on today's encounter.   ? ?Tetanus 2019 ?Flu prev done.  ?PNA not due  ?Shingles not due.   ?Covid vaccine prev done.  ?Pap up to date.  ?Colon screening not due.  ?Living will d/w pt.  Mother designated if patient were incapacitated. ?Diet and exercise d/w pt.   ? ?Resting tachycardia noted initially.  Recheck 95.  Still on propranolol.   ? ?She is working with home health.  D/w pt.  Stressors d/w pt.  She was prev in therapy but didn't fit well with prev counselor.   She is safe at home but she is getting separated.  No SI/HI.  She hasn't d/w her kids about pending separation.  ? ?She is off adderall in the meantime.  She noted occ L eyelid twitch/blink but not attributed to med since she is off currently.  Adderall prev helped with concentration at work and school.   ? ?She is still having sx from CTS.  She failed splinting.  D/w pt about seeing ortho.   ? ?She is having recurrent migraines and she may need FMLA forms filled out for that.  She didn't tolerate zomig.  ? ?D/w pt about OSA and sleep test results.   Still fatigued.   ? ?PMH and SH reviewed ? ?Meds, vitals, and allergies reviewed.  ? ?ROS: Per HPI.  Unless specifically indicated otherwise in HPI, the patient denies: ? ?General: fever. ?Eyes: acute vision changes ?ENT: sore throat ?Cardiovascular: chest  pain ?Respiratory: SOB ?GI: vomiting ?GU: dysuria ?Musculoskeletal: acute back pain ?Derm: acute rash ?Neuro: acute motor dysfunction ?Psych: worsening mood ?Endocrine: polydipsia ?Heme: bleeding ?Allergy: hayfever ? ?GEN: nad, alert and oriented ?HEENT: ncat ?NECK: supple w/o LA ?CV: rrr. ?PULM: ctab, no inc wob ?ABD: soft, +bs ?EXT: no edema ?SKIN: well perfused.  ?

## 2021-07-07 NOTE — Patient Instructions (Addendum)
Please call pulmonary next week if they don't call you first.  ?We'll check on a counseling appointment.  ?Restarted adderall in the meantime.   ?Take care.  Glad to see you. ?If you need FMLA papers done then please send them to me.   ?We'll call about seeing ortho.   ?

## 2021-07-09 DIAGNOSIS — Z7189 Other specified counseling: Secondary | ICD-10-CM | POA: Insufficient documentation

## 2021-07-09 DIAGNOSIS — G5600 Carpal tunnel syndrome, unspecified upper limb: Secondary | ICD-10-CM | POA: Insufficient documentation

## 2021-07-09 DIAGNOSIS — G4733 Obstructive sleep apnea (adult) (pediatric): Secondary | ICD-10-CM | POA: Insufficient documentation

## 2021-07-09 NOTE — Assessment & Plan Note (Signed)
They can make sense to get her sleep apnea treated first.  This may affect her migraines.  If she needs FMLA filled out she can let me know. ?

## 2021-07-09 NOTE — Assessment & Plan Note (Signed)
Failed splinting.  Refer to Ortho. ?

## 2021-07-09 NOTE — Assessment & Plan Note (Signed)
Living will d/w pt. Mother designated if patient were incapacitated.  

## 2021-07-09 NOTE — Assessment & Plan Note (Signed)
Tetanus 2019 ?Flu prev done.  ?PNA not due  ?Shingles not due.   ?Covid vaccine prev done.  ?Pap up to date.  ?Colon screening not due.  ?Living will d/w pt.  Mother designated if patient were incapacitated. ?Diet and exercise d/w pt.   ?

## 2021-07-09 NOTE — Assessment & Plan Note (Signed)
I asked her to call about follow-up with pulmonary next week if they do not call her first.  See after visit summary. ?

## 2021-07-09 NOTE — Assessment & Plan Note (Signed)
She is working with home health.  D/w pt.  Stressors d/w pt.  She was prev in therapy but didn't fit well with prev counselor.   She is safe at home but she is getting separated.  No SI/HI.  She hasn't d/w her kids about pending separation.  ? ?She is off adderall in the meantime.  She noted occ L eyelid twitch/blink but not attributed to med since she is off currently.  Adderall prev helped with concentration at work and school.   ? ?Refer back to counseling and restart Adderall.  Routine cautions given to patient. ?

## 2021-07-14 ENCOUNTER — Telehealth: Payer: Self-pay | Admitting: Student

## 2021-07-14 DIAGNOSIS — G4733 Obstructive sleep apnea (adult) (pediatric): Secondary | ICD-10-CM

## 2021-07-14 NOTE — Telephone Encounter (Signed)
Can you order autopap 5-15 with ramp and humidification, clinic visit within a couple months after starting? If she has any questions about it I'm happy to get in touch! ?

## 2021-07-14 NOTE — Telephone Encounter (Signed)
I called and spoke with the pt and notified that we will be ordering her CPAP and will need ov 31-90 days after she starts using CPAP. This is for insurance purposes and she states will call to schedule this visit once she obtains machine. She is aware to call sooner if any questions or issues. Order sent to [facility]. ?

## 2021-07-14 NOTE — Telephone Encounter (Signed)
-----   Message from [name], MD sent at 07/07/2021  4:13 PM EDT ----- ?Regarding: sleep study results. ?Saw patient today.  Please have your staff check with her about CPAP options.  I thank you for your help.  ? ?Crystal Hammond ? ? ?

## 2021-07-26 NOTE — Telephone Encounter (Signed)
Closing encounter since there is no documentation.  ?

## 2021-07-28 ENCOUNTER — Encounter: Payer: Self-pay | Admitting: Allergy

## 2021-07-28 ENCOUNTER — Ambulatory Visit: Payer: 59 | Admitting: Allergy

## 2021-07-28 VITALS — BP 102/78 | HR 92 | Temp 97.8°F | Resp 20 | Ht 60.0 in | Wt 189.2 lb

## 2021-07-28 DIAGNOSIS — L299 Pruritus, unspecified: Secondary | ICD-10-CM | POA: Diagnosis not present

## 2021-07-28 DIAGNOSIS — J301 Allergic rhinitis due to pollen: Secondary | ICD-10-CM | POA: Diagnosis not present

## 2021-07-28 DIAGNOSIS — T781XXA Other adverse food reactions, not elsewhere classified, initial encounter: Secondary | ICD-10-CM

## 2021-07-28 DIAGNOSIS — T781XXD Other adverse food reactions, not elsewhere classified, subsequent encounter: Secondary | ICD-10-CM

## 2021-07-28 DIAGNOSIS — R0602 Shortness of breath: Secondary | ICD-10-CM | POA: Diagnosis not present

## 2021-07-28 NOTE — Patient Instructions (Addendum)
Chronic itching ?Environmental allergies ?Adverse food reaction ?Shortness of breath ? ?- Testing today showed: grasses.  Negative to shellfish and common foods.  ?- Copy of test results provided.  ?- Avoidance measures provided. ? ?- Recommend taking:  ?- Xyzal (levocetirizine) 5mg  tablet once 1-2 times a day with Pepcid 20mg  1 tab 1-2 times a day for high-dose antihistamine regimen.  This can help determine if itching is histamine driven ?- try Sarna lotion.  This is OTC and can help itch that is neurogenic in nature ?- Continue nasal saline rinses as needed to remove allergens from the nasal cavities as well as help with mucous clearance (this is especially helpful to do before the nasal sprays are given) ?- Recommend you do use Flovent 2 puffs twice a day for control of shortness of breath.  ?- Have access to albuterol inhaler 2 puffs every 4-6 hours as needed for cough/wheeze/shortness of breath/chest tightness.  May use 15-20 minutes prior to activity.   Monitor frequency of use.   ? - keep your follow-up with pulmonology for additional planned work-up ? ?- if you become interested in eating shellfish let know and can obtain IgE levels and possible food challenge in-office ?- continue avoidance of shellfish ?- have access to self-injectable epinephrine (Epipen or AuviQ) 0.3mg  at all times ?- follow emergency action plan in case of allergic reaction ? ?Follow-up in 4-6 months or sooner if needed ?

## 2021-07-28 NOTE — Progress Notes (Signed)
? ? ?New Patient Note ? ?RE: DIYORA BRODBECK MRN: [id number] DOB: 1980-07-29 ?Date of Office Visit: 07/28/2021 ? ?Primary care provider: [name], MD ? ?Chief Complaint: itching ? ?History of present illness: ?Crystal Hammond is a 41 y.o. female presenting today for evaluation of pruritus.  ? ?She would like allergy testing. ?She states she has had itching all the time for "well over a year".  She was sent to a dermatologist and she thought she was going to have allergy testing there but did not.  She states she was diagnosed with psoriasis (mostly scalp and uses shampoo/oils) and rosacea and sting. She also has skin tags. ?The itching is all over and all the time.  Doesn't change seasonally.  Not seeing rash. The itch does seem to be worse in the home vs outside.  No one else is in her home has itching or similar symptoms. She has tried keeping pets out of the bedroom which has not helped.  She has tried changing detergents/soaps/body products which has not helped.  No prior travel.  No changes with weight.  Currently receiving any new lumps or bumps.  No change in appetite, no diarrhea or constipation. She was prescribed hydroxyzine a while ago and it helped some; does not recall it making her sleepy.  She has not altered her diet due to the itch and has not noted any foods that seem to make her itchier. ? ?She also reports shortness of breath.  She is currently following with pulmonary.  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 She state she had asthma testing and per the testing does not show she has asthma.  She was diagnosed with sleep apnea and is awaiting cpap. ?She has used albuterol inhaler.  She also has used Flovent inhaler however when that test for asthma became negative she did not see the need to keep using an asthma based medicine.  She does feel like the Flovent was helpful for her shortness of breath symptoms.  She has had cardiac work-up that work-up that was negative.   ? ?She states when she was a child she had a poison ivy outbreak and given Benadryl at that time and then recalls be told she was allergic to it and to never taken it again.  But she is not sure whether if she is allergic to Benadryl or not but she avoids it. ? ?Prednisone is also listed in her allergy list as she reports when she took it she felt like her skin was very hot and flushed. ? ?She has environmental allergies. She reports sinus pressure, congestion, post-nasal drip, sneezing.  She will perform nasal saline spray.  She does run humidifier in the home.  She has used flonase/nasal sprays but states drains down throat and makes her gag thus does not like using nasal sprays.  She has tried claritin, Human resources officer, zyrtec and they made her drowsy.   ?She lived in [place] for about 10 years prior to moving to [place] 6 years ago and her allergy symptoms are a lot worse here.  ? ?With shellfish (clams, oysters) she states her tongue tingles swells and this has been an issue for past 6 years.  She avoids all shellfish.  She does not have access to an epinephrine device. ? ?She does see neurology for history of migraines. ? ?Review of systems: ?Review of Systems  ?Constitutional: Negative.   ?HENT:    ?     See HPI  ?Eyes:  Negative.   ?Respiratory:    ?     See HPI  ?Cardiovascular: Negative.   ?Gastrointestinal: Negative.   ?Musculoskeletal: Negative.   ?Skin:   ?     See HPI  ?Allergic/Immunologic: Negative.   ?Neurological: Negative.   ? ?All other systems negative unless noted above in HPI ? ?Past medical history: ?Past Medical History:  ?Diagnosis Date  ? Anxiety   ? Cervical dysplasia   ? Complication of anesthesia   ? LOW BLOOD PRESSURE  ? Depression   ? Endometriosis   ? 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 Heart murmur   ? OUTGREW  ? History of febrile seizure   ? History of kidney stones   ? History of pneumonia   ? Migraines   ? OCD (obsessive compulsive disorder)   ? Oral herpes   ? Pneumonia   ? PONV (postoperative  nausea and vomiting)   ? Postpartum depression   ? Pre-diabetes   ? Psoriasis   ? PTSD (post-traumatic stress disorder)   ? Recurrent UTI   ? Rosacea   ? ? ?Past surgical history: ?Past Surgical History:  ?Procedure Laterality Date  ? ABDOMINAL SURGERY  2012  ? Endometrial Surgery - went through c-section scar  ? CESAREAN SECTION  2019  ? CESAREAN SECTION  2007  ? CESAREAN SECTION  2014  ? CESAREAN SECTION  2016  ? ENDOMETRIAL BIOPSY  2011  ? GASTROSCHISIS CLOSURE    ? LEEP    ? x 2  ? VENTRAL HERNIA REPAIR N/A 12/11/2019  ? Procedure: HERNIA REPAIR VENTRAL ADULT;  Surgeon: Herbert Pun, MD;  Location: [facility] ORS;  Service: General;  Laterality: N/A;  ? WISDOM TOOTH EXTRACTION    ? XI ROBOTIC ASSISTED VENTRAL HERNIA N/A 12/11/2019  ? Procedure: XI ROBOTIC ASSISTED VENTRAL HERNIA Lap vs open;  Surgeon: Herbert Pun, MD;  Location: [facility] ORS;  Service: General;  Laterality: N/A;  ? ? ?Family history:  ?Family History  ?Problem Relation Age of Onset  ? Depression Mother   ? Alcohol abuse Mother   ? Hernia Mother   ? Rashes / Skin problems Mother   ? Thyroid disease Mother   ? Anxiety disorder Mother   ? Depression Father   ? Alcohol abuse Father   ? Gout Father   ? Diabetes Father   ? Asthma Brother   ? Bipolar disorder Brother   ? Drug abuse Brother   ? ADD / ADHD Brother   ? Breast cancer Maternal Aunt   ? Lung cancer Maternal Aunt   ? Colon cancer Paternal Grandmother   ? Allergic rhinitis Son   ? Asthma Son   ? ADD / ADHD Son   ? Allergic rhinitis Son   ? ADD / ADHD Son   ? Ovarian cancer Neg Hx   ? ? ?Social history: ?Lives in a home without carpeting with gas and electric heating and central cooling.  Cats, dogs, fish in the home.  There is no concern for water damage, mildew or roaches in the home. ?Occupational History  ? Occupation: Chartered certified accountant  ?  Employer: [employer]  ?Tobacco Use  ? Smoking status: Former  ?  Packs/day: 0.30  ?  Years: 3.00  ?  Pack years: 0.90  ?  Types: Cigarettes  ?  Quit  date: 2013  ?  Years since quitting: 10.2  ?  Passive exposure: Past  ? Smokeless tobacco: Never  ?Vaping Use  ? Vaping Use: Never used  ? ? ? ?  Medication List: ?Current Outpatient Medications  ?Medication Sig Dispense Refill  ? albuterol (VENTOLIN HFA) 108 (90 Base) MCG/ACT inhaler Inhale 2 puffs into the lungs every 6 (six) hours as needed for wheezing or shortness of breath. 18 g 0  ? amphetamine-dextroamphetamine (ADDERALL XR) 30 MG 24 hr capsule Take 1 capsule (30 mg total) by mouth daily. 30 capsule 0  ? amphetamine-dextroamphetamine (ADDERALL) 10 MG tablet Take 1 tablet (10 mg total) by mouth daily. 30 tablet 0  ? nortriptyline (PAMELOR) 25 MG capsule Take 1 capsule (25 mg total) by mouth at bedtime. 30 capsule 3  ? propranolol (INDERAL) 20 MG tablet Take 1 tablet (20 mg total) by mouth 2 (two) times daily. 60 tablet 3  ? valACYclovir (VALTREX) 1000 MG tablet Take 2 tablets (2,000 mg total) by mouth 2 (two) times daily for 1 day. Disp #20 for 5 total courses. 20 tablet 0  ? fluticasone (FLOVENT HFA) 110 MCG/ACT inhaler Inhale 1 puff into the lungs in the morning and at bedtime. (Patient not taking: Reported on 07/28/2021) 12 g 12  ? ?No current facility-administered medications for this visit.  ? ? ?Known medication allergies: ?Allergies  ?Allergen Reactions  ? Other Swelling  ?  SHELLFISH ?  ? Phenazopyridine Hives and Rash  ? Benadryl [Diphenhydramine]   ? Prednisone   ?  Intolerant- skin feels hot  ? Zomig [Zolmitriptan]   ?  Diffuse aches.    ? ? ? ?Physical examination: ?Blood pressure 102/78, pulse 92, temperature 97.8 ?F (36.6 ?C), temperature source Temporal, resp. rate 20, height 5' (1.524 m), weight 189 lb 3.2 oz (85.8 kg), SpO2 99 %. ? ?General: Alert, interactive, in no acute distress. ?HEENT: PERRLA, TMs pearly gray, turbinates non-edematous without discharge, post-pharynx non erythematous. ?Neck: Supple without lymphadenopathy. ?Lungs: Clear to auscultation without wheezing, rhonchi or rales. {no  increased work of breathing. ?CV: Normal S1, S2 without murmurs. ?Abdomen: Nondistended, nontender. ?Skin: Warm and dry, without lesions or rashes. ?Extremities:  No clubbing, cyanosis or edema. ?Neur

## 2021-07-31 ENCOUNTER — Encounter: Payer: Self-pay | Admitting: Orthopedic Surgery

## 2021-07-31 ENCOUNTER — Ambulatory Visit (INDEPENDENT_AMBULATORY_CARE_PROVIDER_SITE_OTHER): Payer: 59 | Admitting: Orthopedic Surgery

## 2021-07-31 DIAGNOSIS — G5603 Carpal tunnel syndrome, bilateral upper limbs: Secondary | ICD-10-CM | POA: Insufficient documentation

## 2021-07-31 NOTE — H&P (View-Only) (Signed)
? ?Office Visit Note ?  ?Patient: Crystal Hammond           ?Date of Birth: 04/30/1980           ?MRN: [id number] ?Visit Date: 07/31/2021 ?             ?Requested by: Duncan, Graham S, MD ?[address] ?[place],  [place] [address] ?PCP: Duncan, Graham S, MD ? ? ?Assessment & Plan: ?Visit Diagnoses:  ?1. Bilateral carpal tunnel syndrome   ? ? ?Plan:  ?We discussed the diagnosis, prognosis, and both conservative and operative treatment options for carpal tunnel syndrome. ? ?After our discussion, the patient has elected to proceed with carpal tunnel release.  We reviewed the benefits of surgery and the potential risks including, but not limited to, persistent symptoms, infection, damage to nearby nerves and blood vessels, delayed wound healing, need for additional surgeries.   ? ?All patient concerns and questions were addressed. ? ?A surgical date will be confirmed with the patient.   ? ?Follow-Up Instructions: No follow-ups on file.  ? ?Orders:  ?No orders of the defined types were placed in this encounter. ? ?No orders of the defined types were placed in this encounter. ? ? ? ? Procedures: ?No procedures performed ? ? ?Clinical Data: ?No additional findings. ? ? ?Subjective: ?Chief Complaint  ?Patient presents with  ? Right Hand - Numbness, Pain  ?  RIGHT handed, Pain: 10/10 at times, onset x 9 years, worsening, told it was pregnancy induced  ? Left Hand - Numbness, Pain  ? ? ?This is a 41-year-old right-hand-dominant female presents with numbness and paresthesia involving both hands.  This started 9 years ago with her first pregnancy.  She delivered her last child 3 years ago and her symptoms of progressively worsened since then.  She describes numbness and tingling in the thumb, index, middle, and ring fingers.  Small finger is never involved.  She has numbness both during the day and at night.  She wakes almost nightly with symptoms.  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  She  describes difficulty with opening lids or other fine motor tasks.  She has worn numerous different braces over the years with minimal symptom relief. ? ?Nocturnal Symptoms: Yes, nightly ?Median Nerve Distribution: Yes ?Electrodiagnostic studies: No ?Treatment to date: Night splints ? ?Risk Factors ?Diabetes: Neg ?Hypothyroid: Neg ?C-spine: Neg ?Inflammatory: Neg ?History of trauma: Neg ? ?  ? ? ? ?Review of Systems ? ? ?Objective: ?Vital Signs: BP 111/76 (BP Location: Left Arm, Patient Position: Sitting)   Pulse 67   Ht 5' (1.524 m)   Wt 189 lb 3.2 oz (85.8 kg)   BMI 36.95 kg/m?  ? ?Physical Exam ?Constitutional:   ?   Appearance: Normal appearance.  ?Cardiovascular:  ?   Rate and Rhythm: Normal rate.  ?   Pulses: Normal pulses.  ?Pulmonary:  ?   Effort: Pulmonary effort is normal.  ?Skin: ?   General: Skin is warm and dry.  ?   Capillary Refill: Capillary refill takes less than 2 seconds.  ?Neurological:  ?   Mental Status: She is alert.  ? ? ?Right Hand Exam  ? ?Tenderness  ?The patient is experiencing no tenderness.  ? ?Range of Motion  ?The patient has normal right wrist ROM.  ? ?Other  ?Erythema: absent ?Sensation: normal ?Pulse: present ? ?Comments:  Posive Tinel, Phalen, and Durkan signs.  2PD 5mm in all fingers.    5/5 thenar motor strength without atrophy.  ? ? ?Left Hand Exam  ? ?Tenderness  ?The patient is experiencing no tenderness.  ? ?Range of Motion  ?The patient has normal left wrist ROM. ? ?Other  ?Erythema: absent ?Sensation: normal ?Pulse: present ? ?Comments:  Posive Tinel, Phalen, and Durkan signs.  2PD 5mm in all fingers.  5/5 thenar motor strength without atrophy.  ? ? ? ? ?Specialty Comments:  ?No specialty comments available. ? ?Imaging: ?No results found. ? ? ?PMFS History: ?Patient Active Problem List  ? Diagnosis Date Noted  ? Bilateral carpal tunnel syndrome 07/31/2021  ? Advance care planning 07/09/2021  ? Carpal tunnel syndrome 07/09/2021  ? OSA (obstructive sleep apnea) 07/09/2021   ? SOBOE (shortness of breath on exertion) 03/19/2021  ? Tachycardia 03/19/2021  ? Abdominal pain 02/21/2021  ? Routine general medical examination at a health care facility 01/15/2021  ? 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 Itching 07/22/2020  ? Vitamin D deficiency 01/26/2020  ? Incisional hernia 12/11/2019  ? Prediabetes 07/26/2019  ? GAD (generalized anxiety disorder) 06/25/2019  ? Adult ADHD 06/25/2019  ? Depressive disorder 06/25/2019  ? Family history of congenital anomalies 05/27/2017  ? Herpes 05/27/2017  ? Hx of cesarean section 05/27/2017  ? Migraine with aura 05/27/2017  ? Agoraphobia 05/30/2016  ? History of abnormal cervical Pap smear 01/31/2016  ? History of kidney stones 01/31/2016  ? ?Past Medical History:  ?Diagnosis Date  ? Anxiety   ? Cervical dysplasia   ? Complication of anesthesia   ? LOW BLOOD PRESSURE  ? Depression   ? Endometriosis   ? 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 Heart murmur   ? OUTGREW  ? History of febrile seizure   ? History of kidney stones   ? History of pneumonia   ? Migraines   ? OCD (obsessive compulsive disorder)   ? Oral herpes   ? Pneumonia   ? PONV (postoperative nausea and vomiting)   ? Postpartum depression   ? Pre-diabetes   ? Psoriasis   ? PTSD (post-traumatic stress disorder)   ? Recurrent UTI   ? Rosacea   ?  ?Family History  ?Problem Relation Age of Onset  ? Depression Mother   ? Alcohol abuse Mother   ? Hernia Mother   ? Rashes / Skin problems Mother   ? Thyroid disease Mother   ? Anxiety disorder Mother   ? Depression Father   ? Alcohol abuse Father   ? Gout Father   ? Diabetes Father   ? Asthma Brother   ? Bipolar disorder Brother   ? Drug abuse Brother   ? ADD / ADHD Brother   ? Breast cancer Maternal Aunt   ? Lung cancer Maternal Aunt   ? Colon cancer Paternal Grandmother   ? Allergic rhinitis Son   ? Asthma Son   ? ADD / ADHD Son   ? Allergic rhinitis Son   ? ADD / ADHD Son   ? Ovarian cancer Neg Hx   ?  ?Past Surgical History:  ?Procedure Laterality Date  ?  ABDOMINAL SURGERY  2012  ? Endometrial Surgery - went through c-section scar  ? CESAREAN SECTION  2019  ? CESAREAN SECTION  2007  ? CESAREAN SECTION  2014  ? CESAREAN SECTION  2016  ? ENDOMETRIAL BIOPSY  2011  ? GASTROSCHISIS CLOSURE    ? LEEP    ? x 2  ? VENTRAL HERNIA REPAIR N/A 12/11/2019  ? Procedure: HERNIA REPAIR   VENTRAL ADULT;  Surgeon: Cintron-Diaz, Edgardo, MD;  Location: [facility];  Service: General;  Laterality: N/A;  ? WISDOM TOOTH EXTRACTION    ? XI ROBOTIC ASSISTED VENTRAL HERNIA N/A 12/11/2019  ? Procedure: XI ROBOTIC ASSISTED VENTRAL HERNIA Lap vs open;  Surgeon: Cintron-Diaz, Edgardo, MD;  Location: [facility] ORS;  Service: General;  Laterality: N/A;  ? ?Social History  ? ?Occupational History  ? Occupation: nurse tech  ?  Employer: [employer]  ?Tobacco Use  ? Smoking status: Former  ?  Packs/day: 0.30  ?  Years: 3.00  ?  Pack years: 0.90  ?  Types: Cigarettes  ?  Quit date: 2013  ?  Years since quitting: 10.2  ?  Passive exposure: Past  ? Smokeless tobacco: Never  ?Vaping Use  ? Vaping Use: Never used  ?Substance and Sexual Activity  ? Alcohol use: Yes  ?  Alcohol/week: 2.0 standard drinks  ?  Types: 2 Cans of beer per week  ? Drug use: Never  ? Sexual activity: Yes  ?  Birth control/protection: None  ? ? ? ? ? ? ?

## 2021-07-31 NOTE — Progress Notes (Signed)
? ?Office Visit Note ?  ?Patient: Crystal Hammond           ?Date of Birth: 1981-01-31           ?MRN: [id number] ?Visit Date: 07/31/2021 ?             ?Requested by: Crystal Ghent, MD ?[place] ?[address] [place] [address],  [place] [address] ?PCP: Crystal Ghent, MD ? ? ?Assessment & Plan: ?Visit Diagnoses:  ?1. Bilateral carpal tunnel syndrome   ? ? ?Plan:  ?We discussed the diagnosis, prognosis, and both conservative and operative treatment options for carpal tunnel syndrome. ? ?After our discussion, the patient has elected to proceed with carpal tunnel release.  We reviewed the benefits of surgery and the potential risks including, but not limited to, persistent symptoms, infection, damage to nearby nerves and blood vessels, delayed wound healing, need for additional surgeries.   ? ?All patient concerns and questions were addressed. ? ?A surgical date will be confirmed with the patient.   ? ?Follow-Up Instructions: No follow-ups on file.  ? ?Orders:  ?No orders of the defined types were placed in this encounter. ? ?No orders of the defined types were placed in this encounter. ? ? ? ? Procedures: ?No procedures performed ? ? ?Clinical Data: ?No additional findings. ? ? ?Subjective: ?Chief Complaint  ?Patient presents with  ? Right Hand - Numbness, Pain  ?  RIGHT handed, Pain: 10/10 at times, onset x 9 years, worsening, told it was pregnancy induced  ? Left Hand - Numbness, Pain  ? ? ?This is a 41 year old right-hand-dominant female presents with numbness and paresthesia involving both hands.  This started 9 years ago with her first pregnancy.  She delivered her last child 3 years ago and her symptoms of progressively worsened since then.  She describes numbness and tingling in the thumb, index, middle, and ring fingers.  Small finger is never involved.  She has numbness both during the day and at night.  She wakes almost nightly with symptoms.  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  She  describes difficulty with opening lids or other fine motor tasks.  She has worn numerous different braces over the years with minimal symptom relief. ? ?Nocturnal Symptoms: Yes, nightly ?Median Nerve Distribution: Yes ?Electrodiagnostic studies: No ?Treatment to date: Night splints ? ?Risk Factors ?Diabetes: Neg ?Hypothyroid: Neg ?C-spine: Neg ?Inflammatory: Neg ?History of trauma: Neg ? ?  ? ? ? ?Review of Systems ? ? ?Objective: ?Vital Signs: BP 111/76 (BP Location: Left Arm, Patient Position: Sitting)   Pulse 67   Ht 5' (1.524 m)   Wt 189 lb 3.2 oz (85.8 kg)   BMI 36.95 kg/m?  ? ?Physical Exam ?Constitutional:   ?   Appearance: Normal appearance.  ?Cardiovascular:  ?   Rate and Rhythm: Normal rate.  ?   Pulses: Normal pulses.  ?Pulmonary:  ?   Effort: Pulmonary effort is normal.  ?Skin: ?   General: Skin is warm and dry.  ?   Capillary Refill: Capillary refill takes less than 2 seconds.  ?Neurological:  ?   Mental Status: She is alert.  ? ? ?Right Hand Exam  ? ?Tenderness  ?The patient is experiencing no tenderness.  ? ?Range of Motion  ?The patient has normal right wrist ROM.  ? ?Other  ?Erythema: absent ?Sensation: normal ?Pulse: present ? ?Comments:  Posive Tinel, Phalen, and Durkan signs.  2PD 63mm in all fingers.  5/5 thenar motor strength without atrophy.  ? ? ?Left Hand Exam  ? ?Tenderness  ?The patient is experiencing no tenderness.  ? ?Range of Motion  ?The patient has normal left wrist ROM. ? ?Other  ?Erythema: absent ?Sensation: normal ?Pulse: present ? ?Comments:  Posive Tinel, Phalen, and Durkan signs.  2PD 54mm in all fingers.  5/5 thenar motor strength without atrophy.  ? ? ? ? ?Specialty Comments:  ?No specialty comments available. ? ?Imaging: ?No results found. ? ? ?PMFS History: ?Patient Active Problem List  ? Diagnosis Date Noted  ? Bilateral carpal tunnel syndrome 07/31/2021  ? Advance care planning 07/09/2021  ? Carpal tunnel syndrome 07/09/2021  ? OSA (obstructive sleep apnea) 07/09/2021   ? SOBOE (shortness of breath on exertion) 03/19/2021  ? Tachycardia 03/19/2021  ? Abdominal pain 02/21/2021  ? Routine general medical examination at a health care facility 01/15/2021  ? 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 Itching 07/22/2020  ? Vitamin D deficiency 01/26/2020  ? Incisional hernia 12/11/2019  ? Prediabetes 07/26/2019  ? GAD (generalized anxiety disorder) 06/25/2019  ? Adult ADHD 06/25/2019  ? Depressive disorder 06/25/2019  ? Family history of congenital anomalies 05/27/2017  ? Herpes 05/27/2017  ? Hx of cesarean section 05/27/2017  ? Migraine with aura 05/27/2017  ? Agoraphobia 05/30/2016  ? History of abnormal cervical Pap smear 01/31/2016  ? History of kidney stones 01/31/2016  ? ?Past Medical History:  ?Diagnosis Date  ? Anxiety   ? Cervical dysplasia   ? Complication of anesthesia   ? LOW BLOOD PRESSURE  ? Depression   ? Endometriosis   ? 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 Heart murmur   ? OUTGREW  ? History of febrile seizure   ? History of kidney stones   ? History of pneumonia   ? Migraines   ? OCD (obsessive compulsive disorder)   ? Oral herpes   ? Pneumonia   ? PONV (postoperative nausea and vomiting)   ? Postpartum depression   ? Pre-diabetes   ? Psoriasis   ? PTSD (post-traumatic stress disorder)   ? Recurrent UTI   ? Rosacea   ?  ?Family History  ?Problem Relation Age of Onset  ? Depression Mother   ? Alcohol abuse Mother   ? Hernia Mother   ? Rashes / Skin problems Mother   ? Thyroid disease Mother   ? Anxiety disorder Mother   ? Depression Father   ? Alcohol abuse Father   ? Gout Father   ? Diabetes Father   ? Asthma Brother   ? Bipolar disorder Brother   ? Drug abuse Brother   ? ADD / ADHD Brother   ? Breast cancer Maternal Aunt   ? Lung cancer Maternal Aunt   ? Colon cancer Paternal Grandmother   ? Allergic rhinitis Son   ? Asthma Son   ? ADD / ADHD Son   ? Allergic rhinitis Son   ? ADD / ADHD Son   ? Ovarian cancer Neg Hx   ?  ?Past Surgical History:  ?Procedure Laterality Date  ?  ABDOMINAL SURGERY  2012  ? Endometrial Surgery - went through c-section scar  ? CESAREAN SECTION  2019  ? CESAREAN SECTION  2007  ? CESAREAN SECTION  2014  ? CESAREAN SECTION  2016  ? ENDOMETRIAL BIOPSY  2011  ? GASTROSCHISIS CLOSURE    ? LEEP    ? x 2  ? VENTRAL HERNIA REPAIR N/A 12/11/2019  ? Procedure: HERNIA REPAIR  VENTRAL ADULT;  Surgeon: Herbert Pun, MD;  Location: [facility] ORS;  Service: General;  Laterality: N/A;  ? WISDOM TOOTH EXTRACTION    ? XI ROBOTIC ASSISTED VENTRAL HERNIA N/A 12/11/2019  ? Procedure: XI ROBOTIC ASSISTED VENTRAL HERNIA Lap vs open;  Surgeon: Herbert Pun, MD;  Location: [facility] ORS;  Service: General;  Laterality: N/A;  ? ?Social History  ? ?Occupational History  ? Occupation: Chartered certified accountant  ?  Employer: [employer]  ?Tobacco Use  ? Smoking status: Former  ?  Packs/day: 0.30  ?  Years: 3.00  ?  Pack years: 0.90  ?  Types: Cigarettes  ?  Quit date: 2013  ?  Years since quitting: 10.2  ?  Passive exposure: Past  ? Smokeless tobacco: Never  ?Vaping Use  ? Vaping Use: Never used  ?Substance and Sexual Activity  ? Alcohol use: Yes  ?  Alcohol/week: 2.0 standard drinks  ?  Types: 2 Cans of beer per week  ? Drug use: Never  ? Sexual activity: Yes  ?  Birth control/protection: None  ? ? ? ? ? ? ?

## 2021-08-01 ENCOUNTER — Other Ambulatory Visit: Payer: Self-pay

## 2021-08-01 ENCOUNTER — Encounter (HOSPITAL_BASED_OUTPATIENT_CLINIC_OR_DEPARTMENT_OTHER): Payer: Self-pay | Admitting: Orthopedic Surgery

## 2021-08-07 ENCOUNTER — Ambulatory Visit (HOSPITAL_BASED_OUTPATIENT_CLINIC_OR_DEPARTMENT_OTHER): Payer: 59 | Admitting: Anesthesiology

## 2021-08-07 ENCOUNTER — Other Ambulatory Visit: Payer: Self-pay

## 2021-08-07 ENCOUNTER — Encounter (HOSPITAL_BASED_OUTPATIENT_CLINIC_OR_DEPARTMENT_OTHER): Payer: Self-pay | Admitting: Orthopedic Surgery

## 2021-08-07 ENCOUNTER — Encounter (HOSPITAL_BASED_OUTPATIENT_CLINIC_OR_DEPARTMENT_OTHER)
Admission: RE | Disposition: A | Discharge status: Home / Self Care | Payer: Self-pay | Source: Home / Self Care | Attending: Orthopedic Surgery

## 2021-08-07 ENCOUNTER — Ambulatory Visit (HOSPITAL_BASED_OUTPATIENT_CLINIC_OR_DEPARTMENT_OTHER)
Admission: RE | Admit: 2021-08-07 | Discharge: 2021-08-07 | Disposition: A | Discharge status: Home / Self Care | Payer: 59 | Attending: Orthopedic Surgery | Admitting: Orthopedic Surgery

## 2021-08-07 DIAGNOSIS — G5601 Carpal tunnel syndrome, right upper limb: Secondary | ICD-10-CM

## 2021-08-07 DIAGNOSIS — E669 Obesity, unspecified: Secondary | ICD-10-CM

## 2021-08-07 DIAGNOSIS — F429 Obsessive-compulsive disorder, unspecified: Secondary | ICD-10-CM | POA: Diagnosis not present

## 2021-08-07 DIAGNOSIS — Z6836 Body mass index (BMI) 36.0-36.9, adult: Secondary | ICD-10-CM | POA: Diagnosis not present

## 2021-08-07 DIAGNOSIS — J189 Pneumonia, unspecified organism: Secondary | ICD-10-CM | POA: Diagnosis not present

## 2021-08-07 HISTORY — DX: Sleep apnea, unspecified: G47.30

## 2021-08-07 HISTORY — PX: CARPAL TUNNEL RELEASE: SHX101

## 2021-08-07 LAB — POCT PREGNANCY, URINE: Preg Test, Ur: NEGATIVE

## 2021-08-07 SURGERY — CARPAL TUNNEL RELEASE
Anesthesia: Monitor Anesthesia Care | Site: Hand | Laterality: Right

## 2021-08-07 MED ORDER — OXYCODONE HCL 5 MG/5ML PO SOLN: 5.0000 mg | Freq: Once | ORAL | Status: DC | PRN

## 2021-08-07 MED ORDER — FENTANYL CITRATE (PF) 100 MCG/2ML IJ SOLN
INTRAMUSCULAR | Status: AC
  Filled 2021-08-07: qty 2

## 2021-08-07 MED ORDER — ACETAMINOPHEN 500 MG PO TABS
ORAL_TABLET | ORAL | Status: AC
  Filled 2021-08-07: qty 2

## 2021-08-07 MED ORDER — FENTANYL CITRATE (PF) 100 MCG/2ML IJ SOLN: 25.0000 ug | INTRAMUSCULAR | Status: DC | PRN

## 2021-08-07 MED ORDER — MIDAZOLAM HCL 2 MG/2ML IJ SOLN
INTRAMUSCULAR | Status: AC
Start: 2021-08-07 — End: ?
  Filled 2021-08-07: qty 2

## 2021-08-07 MED ORDER — LIDOCAINE HCL (PF) 1 % IJ SOLN
INTRAMUSCULAR | Status: AC
  Filled 2021-08-07: qty 30

## 2021-08-07 MED ORDER — ONDANSETRON HCL 4 MG/2ML IJ SOLN
INTRAMUSCULAR | Status: DC | PRN
  Administered 2021-08-07: 4 mg via INTRAVENOUS

## 2021-08-07 MED ORDER — ACETAMINOPHEN 500 MG PO TABS
1000.0000 mg | ORAL_TABLET | Freq: Once | ORAL | Status: AC
  Administered 2021-08-07: 1000 mg via ORAL

## 2021-08-07 MED ORDER — MIDAZOLAM HCL 5 MG/5ML IJ SOLN
INTRAMUSCULAR | Status: DC | PRN
  Administered 2021-08-07: 2 mg via INTRAVENOUS

## 2021-08-07 MED ORDER — PROPOFOL 500 MG/50ML IV EMUL
INTRAVENOUS | Status: AC
  Filled 2021-08-07: qty 50

## 2021-08-07 MED ORDER — OXYCODONE HCL 5 MG PO TABS: 5.0000 mg | ORAL_TABLET | Freq: Once | ORAL | Status: DC | PRN

## 2021-08-07 MED ORDER — 0.9 % SODIUM CHLORIDE (POUR BTL) OPTIME
TOPICAL | Status: DC | PRN
  Administered 2021-08-07: 50 mL

## 2021-08-07 MED ORDER — LACTATED RINGERS IV SOLN: INTRAVENOUS | Status: DC

## 2021-08-07 MED ORDER — PROPOFOL 10 MG/ML IV BOLUS
INTRAVENOUS | Status: DC | PRN
  Administered 2021-08-07: 50 mg via INTRAVENOUS

## 2021-08-07 MED ORDER — FENTANYL CITRATE (PF) 100 MCG/2ML IJ SOLN
INTRAMUSCULAR | Status: DC | PRN
  Administered 2021-08-07 (×2): 50 ug via INTRAVENOUS

## 2021-08-07 MED ORDER — OXYCODONE HCL 5 MG PO TABS
5.0000 mg | ORAL_TABLET | Freq: Four times a day (QID) | ORAL | 0 refills | Status: AC | PRN
  Filled 2021-08-07: qty 12, 3d supply, fill #0

## 2021-08-07 MED ORDER — MEPERIDINE HCL 25 MG/ML IJ SOLN: 6.2500 mg | INTRAMUSCULAR | Status: DC | PRN

## 2021-08-07 MED ORDER — AMISULPRIDE (ANTIEMETIC) 5 MG/2ML IV SOLN: 10.0000 mg | Freq: Once | INTRAVENOUS | Status: DC | PRN

## 2021-08-07 MED ORDER — LIDOCAINE HCL 1 % IJ SOLN
INTRAMUSCULAR | Status: DC | PRN
  Administered 2021-08-07: 8 mL

## 2021-08-07 MED ORDER — PROPOFOL 500 MG/50ML IV EMUL
INTRAVENOUS | Status: DC | PRN
  Administered 2021-08-07: 100 ug/kg/min via INTRAVENOUS

## 2021-08-07 SURGICAL SUPPLY — 44 items
APL PRP STRL LF DISP 70% ISPRP (MISCELLANEOUS) ×1
BLADE SURG 15 STRL LF DISP TIS (BLADE) ×1 IMPLANT
BLADE SURG 15 STRL SS (BLADE) ×2
BNDG CMPR 9X4 STRL LF SNTH (GAUZE/BANDAGES/DRESSINGS) ×1
BNDG ELASTIC 3X5.8 VLCR STR LF (GAUZE/BANDAGES/DRESSINGS) ×2 IMPLANT
BNDG ESMARK 4X9 LF (GAUZE/BANDAGES/DRESSINGS) ×2 IMPLANT
BNDG GAUZE ELAST 4 BULKY (GAUZE/BANDAGES/DRESSINGS) ×2 IMPLANT
BNDG PLASTER X FAST 3X3 WHT LF (CAST SUPPLIES) IMPLANT
BNDG PLSTR 9X3 FST ST WHT (CAST SUPPLIES)
CHLORAPREP W/TINT 26 (MISCELLANEOUS) ×2 IMPLANT
CORD BIPOLAR FORCEPS 12FT (ELECTRODE) ×2 IMPLANT
COVER BACK TABLE 60X90IN (DRAPES) ×2 IMPLANT
COVER MAYO STAND STRL (DRAPES) ×2 IMPLANT
CUFF TOURN SGL QUICK 18X4 (TOURNIQUET CUFF) IMPLANT
CUFF TOURN SGL QUICK 24 (TOURNIQUET CUFF)
CUFF TRNQT CYL 24X4X16.5-23 (TOURNIQUET CUFF) IMPLANT
DRAPE EXTREMITY T 121X128X90 (DISPOSABLE) ×2 IMPLANT
DRAPE SURG 17X23 STRL (DRAPES) ×1 IMPLANT
DRAPE U-SHAPE 47X51 STRL (DRAPES) ×1 IMPLANT
GAUZE XEROFORM 1X8 LF (GAUZE/BANDAGES/DRESSINGS) ×1 IMPLANT
GLOVE BIO SURGEON STRL SZ 6.5 (GLOVE) ×2 IMPLANT
GLOVE BIO SURGEON STRL SZ7 (GLOVE) ×2 IMPLANT
GLOVE BIOGEL PI IND STRL 7.0 (GLOVE) ×1 IMPLANT
GLOVE BIOGEL PI INDICATOR 7.0 (GLOVE) ×1
GOWN STRL REUS W/ TWL LRG LVL3 (GOWN DISPOSABLE) ×1 IMPLANT
GOWN STRL REUS W/TWL LRG LVL3 (GOWN DISPOSABLE) ×2
GOWN STRL REUS W/TWL XL LVL3 (GOWN DISPOSABLE) ×2 IMPLANT
NDL HYPO 25X1 1.5 SAFETY (NEEDLE) IMPLANT
NEEDLE HYPO 25X1 1.5 SAFETY (NEEDLE) ×2 IMPLANT
NS IRRIG 1000ML POUR BTL (IV SOLUTION) ×2 IMPLANT
PACK BASIN DAY SURGERY FS (CUSTOM PROCEDURE TRAY) ×2 IMPLANT
PAD CAST 3X4 CTTN HI CHSV (CAST SUPPLIES) ×1 IMPLANT
PADDING CAST COTTON 3X4 STRL (CAST SUPPLIES) ×2
SLEEVE SCD COMPRESS KNEE MED (STOCKING) IMPLANT
SUCTION FRAZIER HANDLE 10FR (MISCELLANEOUS)
SUCTION TUBE FRAZIER 10FR DISP (MISCELLANEOUS) IMPLANT
SUT ETHILON 4 0 PS 2 18 (SUTURE) ×2 IMPLANT
SUT MNCRL AB 3-0 PS2 18 (SUTURE) IMPLANT
SUT VICRYL 4-0 PS2 18IN ABS (SUTURE) IMPLANT
SYR BULB EAR ULCER 3OZ GRN STR (SYRINGE) ×2 IMPLANT
SYR CONTROL 10ML LL (SYRINGE) ×1 IMPLANT
TOWEL GREEN STERILE FF (TOWEL DISPOSABLE) ×3 IMPLANT
TUBE CONNECTING 20X1/4 (TUBING) IMPLANT
UNDERPAD 30X36 HEAVY ABSORB (UNDERPADS AND DIAPERS) ×2 IMPLANT

## 2021-08-07 NOTE — Transfer of Care (Signed)
Immediate Anesthesia Transfer of Care Note ? ?Patient: Crystal Hammond ? ?Procedure(s) Performed: RIGHT CARPAL TUNNEL RELEASE (Right: Hand) ? ?Patient Location: PACU ? ?Anesthesia Type:MAC ? ?Level of Consciousness: awake, alert  and oriented ? ?Airway & Oxygen Therapy: Patient Spontanous Breathing and Patient connected to face mask oxygen ? ?Post-op Assessment: Report given to RN and Post -op Vital signs reviewed and stable ? ?Post vital signs: Reviewed and stable ? ?Last Vitals:  ?Vitals Value Taken Time  ?BP 101/56 08/07/21 1507  ?Temp    ?Pulse 78 08/07/21 1508  ?Resp 22 08/07/21 1508  ?SpO2 100 % 08/07/21 1508  ?Vitals shown include unvalidated device data. ? ?Last Pain:  ?Vitals:  ? 08/07/21 1241  ?TempSrc: Oral  ?PainSc: 0-No pain  ?   ? ?Patients Stated Pain Goal: 5 (08/07/21 1241) ? ?Complications: No notable events documented. ?

## 2021-08-07 NOTE — Interval H&P Note (Signed)
History and Physical Interval Note: ? ?08/07/2021 ?1:13 PM ? ?Crystal Hammond  has presented today for surgery, with the diagnosis of RIGHT CARPAL TUNNEL SYNDROME.  The various methods of treatment have been discussed with the patient and family. After consideration of risks, benefits and other options for treatment, the patient has consented to  Procedure(s): ?RIGHT CARPAL TUNNEL RELEASE (Right) as a surgical intervention.  The patient's history has been reviewed, patient examined, no change in status, stable for surgery.  I have reviewed the patient's chart and labs.  Questions were answered to the patient's satisfaction.   ? ? ? Crystal Hammond ? ? ?

## 2021-08-07 NOTE — Anesthesia Postprocedure Evaluation (Signed)
Anesthesia Post Note ? ?Patient: Crystal Hammond ? ?Procedure(s) Performed: RIGHT CARPAL TUNNEL RELEASE (Right: Hand) ? ?  ? ?Patient location during evaluation: PACU ?Anesthesia Type: MAC ?Level of consciousness: awake and alert ?Pain management: pain level controlled ?Vital Signs Assessment: post-procedure vital signs reviewed and stable ?Respiratory status: spontaneous breathing ?Cardiovascular status: stable ?Anesthetic complications: no ? ? ?No notable events documented. ? ?Last Vitals:  ?Vitals:  ? 08/07/21 1515 08/07/21 1530  ?BP: 104/66 110/66  ?Pulse: 69 72  ?Resp: 20 19  ?Temp:    ?SpO2: 100% 98%  ?  ?Last Pain:  ?Vitals:  ? 08/07/21 1530  ?TempSrc:   ?PainSc: 2   ? ? ?  ?  ?  ?  ?  ?  ? ?Nolon Nations ? ? ? ? ?

## 2021-08-07 NOTE — Op Note (Signed)
? ?  Date of Surgery: 08/07/2021 ? ?INDICATIONS: Patient is a 41 y.o.-year-old female with right carpal tunnel syndrome that has failed conservative management.  Risks, benefits, and alternatives to surgery were again discussed with the patient in the preoperative area. The patient wishes to proceed with surgery.  Informed consent was signed after our discussion.  ? ?PREOPERATIVE DIAGNOSIS:  ?Right carpal tunnel syndrome ? ?POSTOPERATIVE DIAGNOSIS: Same. ? ?PROCEDURE:  ?Right carpal tunnel release ? ? ?SURGEON: Audria Nine, M.D. ? ?ASSIST:  ? ?ANESTHESIA:  Local, MAC ? ?IV FLUIDS AND URINE: See anesthesia. ? ?ESTIMATED BLOOD LOSS: <5 mL. ? ?IMPLANTS: * No implants in log *  ? ?DRAINS: None ? ?COMPLICATIONS: None ? ?DESCRIPTION OF PROCEDURE: The patient was met in the preoperative holding area where the surgical site was marked and the consent form was verified.  The patient was then taken to the operating room and transferred to the operating table.  All bony prominences were well padded.  A tourniquet was applied to the right forearm.  Monitored sedation was induced.   A formal time-out was performed to confirm that this was the correct patient, surgery, side, and site. The operative extremity was prepped and draped in the usual and sterile fashion.   ? ?Following a second timeout, the limb was exsanguinated and the tourniquet inflated to 250 mmHg.  A longitudinal incision was made in line with the radial border of the ring finger from distal to the wrist flexion crease to the intersection of Kaplan's cardinal line.  The skin and subcutaneous tissue was sharply divided.  The longitudinally running palmar fascia was incised.  The thenar musculature was bluntly swept off of the transverse carpal ligament.  The ligament was divided from proximal to distal until the fat surrounding the palmar arch was encountered. Retractors were then placed in the proximal aspect of the wound to visualize the distal antebrachial  fascia.  The fascia was sharply divided under direct visualization.   The wound was then thoroughly irrigated with sterile saline.  The tourniquet was deflated.  Hemostasis was achieved with direct pressure and bipolar electrocautery.  The wound was then closed with 4-0 nylon sutures in a horizontal mattress fashion. The wound was then dressed with xeroform, folded kerlix, and an ace wrap. ? ?The patient was then reversed from anesthesia and transferred to the postoperative bed.  All counts were correct x 2 at the end of the procedure.  The patient was taken to the recovery unit in stable condition.    ? ? ?POSTOPERATIVE PLAN: Patient will be discharged to home with appropriate pain medication and discharge instructions.  I will see her back in the office in 10-14 days for her first postop visit.  ? ?Audria Nine, MD ?3:19 PM  ?

## 2021-08-07 NOTE — Brief Op Note (Signed)
08/07/2021 ? ?3:18 PM ? ?PATIENT:  Alfonse Ras  41 y.o. female ? ?PRE-OPERATIVE DIAGNOSIS:  RIGHT CARPAL TUNNEL SYNDROME ? ?POST-OPERATIVE DIAGNOSIS:  RIGHT CARPAL TUNNEL SYNDROME ? ?PROCEDURE:  Procedure(s): ?RIGHT CARPAL TUNNEL RELEASE (Right) ? ?SURGEON:  Surgeon(s) and Role: ?   * Sherilyn Cooter, MD - Primary ? ?PHYSICIAN ASSISTANT:  ? ?ASSISTANTS: none  ? ?ANESTHESIA:   local and MAC ? ?EBL:  <5 mL  ? ?BLOOD ADMINISTERED:none ? ?DRAINS: none  ? ?LOCAL MEDICATIONS USED:  MARCAINE    and LIDOCAINE  ? ?SPECIMEN:  No Specimen ? ?DISPOSITION OF SPECIMEN:  N/A ? ?COUNTS:  YES ? ?TOURNIQUET:   ?Total Tourniquet Time Documented: ?Forearm (Right) - 17 minutes ?Total: Forearm (Right) - 17 minutes ? ? ?DICTATION: .Dragon Dictation ? ?PLAN OF CARE: Discharge to home after PACU ? ?PATIENT DISPOSITION:  PACU - hemodynamically stable. ?  ?Delay start of Pharmacological VTE agent (>24hrs) due to surgical blood loss or risk of bleeding: not applicable ? ?

## 2021-08-07 NOTE — Anesthesia Preprocedure Evaluation (Addendum)
Anesthesia Evaluation  ?Patient identified by MRN, date of birth, ID band ?Patient awake ? ? ? ?Reviewed: ?Allergy & Precautions, H&P , NPO status , Patient's Chart, lab work & pertinent test results, reviewed documented beta blocker date and time  ? ?History of Anesthesia Complications ?(+) PONV and history of anesthetic complications (PONV, has not previously had scopolamine patch to her knowledge. Also reports "low blood pressure" with anesthesia) ? ?Airway ?Mallampati: III ? ?TM Distance: >3 FB ? ? ? ? Dental ? ?(+) Teeth Intact, Dental Advisory Given ?  ?Pulmonary ?neg sleep apnea, pneumonia, neg COPD, former smoker,  ?  ?breath sounds clear to auscultation ? ? ? ? ? ? Cardiovascular ?(-) angina(-) Past MI and (-) Cardiac Stents (-) dysrhythmias + Valvular Problems/Murmurs  ?Rhythm:regular Rate:Normal ? ? ?  ?Neuro/Psych ? Headaches, PSYCHIATRIC DISORDERS Anxiety Depression OCD  ? GI/Hepatic ?negative GI ROS, Neg liver ROS,   ?Endo/Other  ?negative endocrine ROS ? Renal/GU ?  ? ?  ?Musculoskeletal ? ? Abdominal ?  ?Peds ? Hematology ?negative hematology ROS ?(+)   ?Anesthesia Other Findings ?Very nervous patient ?Obesity ? ?Past Medical History: ?No date: Anxiety ?No date: Cervical dysplasia ?No date: Complication of anesthesia ?    Comment:  LOW BLOOD PRESSURE ?No date: Depression ?No date: Endometriosis ?[birth year] (birth): Gastroschisis ?No date: Heart murmur ?    Comment:  OUTGREW ?No date: History of febrile seizure ?No date: History of kidney stones ?No date: History of pneumonia ?No date: Migraines ?No date: OCD (obsessive compulsive disorder) ?No date: Oral herpes ?No date: Pneumonia ?No date: Postpartum depression ?No date: Pre-diabetes ?No date: PTSD (post-traumatic stress disorder) ?No date: Recurrent UTI ? ?Past Surgical History: ?2012: ABDOMINAL SURGERY ?    Comment:  Endometrial Surgery - went through c-section scar ?2019: CESAREAN SECTION ?2007: CESAREAN SECTION ?2014:  CESAREAN SECTION ?2016: CESAREAN SECTION ?No date: GASTROSCHISIS CLOSURE ?No date: LEEP ?    Comment:  x 2 ?No date: WISDOM TOOTH EXTRACTION ? ? ? ? Reproductive/Obstetrics ?negative OB ROS ? ?  ? ? ? ? ? ? ? ? ? ? ? ? ? ?  ?  ? ? ? ? ? ? ?Anesthesia Physical ? ?Anesthesia Plan ? ?ASA: 2 ? ?Anesthesia Plan: MAC  ? ?Post-op Pain Management: Tylenol PO (pre-op)* and Celebrex PO (pre-op)*  ? ?Induction: Intravenous ? ?PONV Risk Score and Plan: 3 and Ondansetron, Propofol infusion, TIVA, Midazolam and Treatment may vary due to age or medical condition ? ?Airway Management Planned:  ? ?Additional Equipment: None ? ?Intra-op Plan:  ? ?Post-operative Plan:  ? ?Informed Consent: I have reviewed the patients History and Physical, chart, labs and discussed the procedure including the risks, benefits and alternatives for the proposed anesthesia with the patient or authorized representative who has indicated his/her understanding and acceptance.  ? ? ? ?Dental advisory given ? ?Plan Discussed with: CRNA ? ?Anesthesia Plan Comments:   ? ? ? ? ?Anesthesia Quick Evaluation ? ?

## 2021-08-07 NOTE — Discharge Instructions (Addendum)
No tylenol until after 7pm today, if needed. ? ? ?Post Anesthesia Home Care Instructions ? ?Activity: ?Get plenty of rest for the remainder of the day. A responsible individual must stay with you for 24 hours following the procedure.  ?For the next 24 hours, DO NOT: ?-Drive a car ?-Advertising copywriter ?-Drink alcoholic beverages ?-Take any medication unless instructed by your physician ?-Make any legal decisions or sign important papers. ? ?Meals: ?Start with liquid foods such as gelatin or soup. Progress to regular foods as tolerated. Avoid greasy, spicy, heavy foods. If nausea and/or vomiting occur, drink only clear liquids until the nausea and/or vomiting subsides. Call your physician if vomiting continues. ? ?Special Instructions/Symptoms: ?Your throat may feel dry or sore from the anesthesia or the breathing tube placed in your throat during surgery. If this causes discomfort, gargle with warm salt water. The discomfort should disappear within 24 hours. ? ?If you had a scopolamine patch placed behind your ear for the management of post- operative nausea and/or vomiting: ? ?1. The medication in the patch is effective for 72 hours, after which it should be removed.  Wrap patch in a tissue and discard in the trash. Wash hands thoroughly with soap and water. ?2. You may remove the patch earlier than 72 hours if you experience unpleasant side effects which may include dry mouth, dizziness or visual disturbances. ?3. Avoid touching the patch. Wash your hands with soap and water after contact with the patch. ? ? ? ?Waylan Rocher, M.D. ?Hand Surgery ? ?POST-OPERATIVE DISCHARGE INSTRUCTIONS ? ? ?PRESCRIPTIONS: ?- You may have been given a prescription to be taken as directed for post-operative pain control.  You may also take over the counter ibuprofen/aleve and tylenol for pain. Take this as directed on the packaging. Do not exceed 3000 mg tylenol/acetaminophen in 24 hours. ? ?Ibuprofen 600-800 mg (3-4) tablets by  mouth every 6 hours as needed for pain.  ? ?OR ? ?Aleve 2 tablets by mouth every 12 hours (twice daily) as needed for pain. ?  ?AND/OR ? ?Tylenol 1000 mg (2 tablets) every 8 hours as needed for pain. ? ?- Please use your pain medication carefully, as refills are limited and you may not be provided with one.  As stated above, please use over the counter pain medicine - it will also be helpful with decreasing your swelling.  ? ? ?ANESTHESIA: ?-After your surgery, post-surgical discomfort or pain is likely. This discomfort can last several days to a few weeks. At certain times of the day your discomfort may be more intense.  ? ?Did you receive a nerve block?  ? ?- A nerve block can provide pain relief for one hour to two days after your surgery. As long as the nerve block is working, you will experience little or no sensation in the area the surgeon operated on.  ?- As the nerve block wears off, you will begin to experience pain or discomfort. It is very important that you begin taking your prescribed pain medication before the nerve block fully wears off. Treating your pain at the first sign of the block wearing off will ensure your pain is better controlled and more tolerable when full-sensation returns. Do not wait until the pain is intolerable, as the medicine will be less effective. It is better to treat pain in advance than to try and catch up.  ? ?General Anesthesia:  ?If you did not receive a nerve block during your surgery, you will need to start taking your  pain medication shortly after your surgery and should continue to do so as prescribed by your surgeon.   ? ? ?ICE AND ELEVATION: ?- You may use ice for the first 48-72 hours, but it is not critical.   ?- Motion of your fingers is very important to decrease the swelling.  ?- Elevation, as much as possible for the next 48 hours, is critical for decreasing swelling as well as for pain relief. Elevation means when you are seated or lying down, you hand should  be at or above your heart. When walking, the hand needs to be at or above the level of your elbow.  ?- If the bandage gets too tight, it may need to be loosened. Please contact our office and we will instruct you in how to do this.  ? ? ?SURGICAL BANDAGES:  ?- Keep your dressing and/or splint clean and dry at all times.  You can remove your dressing 4 days from now and change with a dry dressing or Band-Aids as needed thereafter. ?- You may place a plastic bag over your bandage to shower, but be careful, do not get your bandages wet.  ?- After the bandages have been removed, it is OK to get the stitches wet in a shower or with hand washing. Do Not soak or submerge the wound yet. Please do not use lotions or creams on the stitches.   ?  ? ?HAND THERAPY:  ?- You may not need any. If you do, we will begin this at your follow up visit in the clinic.  ? ? ?ACTIVITY AND WORK: ?- You are encouraged to move any fingers which are not in the bandage.  ?- Light use of the fingers is allowed to assist the other hand with daily hygiene and eating, but strong gripping or lifting is often uncomfortable and should be avoided.  ?- You might miss a variable period of time from work and hopefully this issue has been discussed prior to surgery. You may not do any heavy work with your affected hand for about 2 weeks.  ? ? ? [facility] ?[address] ?[place],  [place]  [address] ?[phone]  ?    ?

## 2021-08-08 ENCOUNTER — Encounter (HOSPITAL_BASED_OUTPATIENT_CLINIC_OR_DEPARTMENT_OTHER): Payer: Self-pay | Admitting: Orthopedic Surgery

## 2021-08-10 ENCOUNTER — Telehealth: Payer: Self-pay | Admitting: Orthopedic Surgery

## 2021-08-10 NOTE — Telephone Encounter (Signed)
[employer] forms received. To [employer]. 

## 2021-08-11 DIAGNOSIS — G4733 Obstructive sleep apnea (adult) (pediatric): Secondary | ICD-10-CM | POA: Diagnosis not present

## 2021-08-15 ENCOUNTER — Telehealth: Payer: Self-pay | Admitting: Student

## 2021-08-15 NOTE — Telephone Encounter (Signed)
Received CPAP set up fax from [employer]  ? ?DME- [employer]  ? ?CPAP started 08/11/21 ?Airsense 11  ?resmed F20 HER small  ?Pressure 5-15 auto  ? ?Pt will need f/u with provider between 09/11/21-11/09/21  ? ?LMTCB  ?

## 2021-08-16 ENCOUNTER — Other Ambulatory Visit: Payer: Self-pay

## 2021-08-17 ENCOUNTER — Encounter: Payer: Self-pay | Admitting: Orthopedic Surgery

## 2021-08-17 ENCOUNTER — Other Ambulatory Visit: Payer: Self-pay

## 2021-08-17 ENCOUNTER — Ambulatory Visit (INDEPENDENT_AMBULATORY_CARE_PROVIDER_SITE_OTHER): Payer: 59 | Admitting: Orthopedic Surgery

## 2021-08-17 DIAGNOSIS — G5603 Carpal tunnel syndrome, bilateral upper limbs: Secondary | ICD-10-CM

## 2021-08-17 NOTE — Progress Notes (Signed)
 Post-Op Visit Note   Patient: Crystal Hammond           Date of Birth: 09/22/1980           MRN: [id number] Visit Date: 08/17/2021 PCP: Duncan, Graham S, MD   Assessment & Plan:  Chief Complaint:  Chief Complaint  Patient presents with   Right Hand - Routine Post Op   Visit Diagnoses:  1. Bilateral carpal tunnel syndrome     Plan: Patient is 10 days s/p right CTR.  Her numbness has resolved.  She still has some soreness in the thenar eminence.  Her incision is clean, dry, and well approximated.  The sutures were removed.  She would like to proceed with L CTR.  We again discussed the risks of surgery including bleeding, infection, damage to neurovascular structures, incomplete symptom relief, and need for additional surgery.  A surgical date and time will be confirmed with the patient.   Follow-Up Instructions: No follow-ups on file.   Orders:  No orders of the defined types were placed in this encounter.  No orders of the defined types were placed in this encounter.   Imaging: No results found.  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   Itching 07/22/2020   Vitamin D deficiency 01/26/2020   Incisional hernia 12/11/2019   Prediabetes 07/26/2019   GAD (generalized anxiety disorder) 06/25/2019   Adult ADHD 06/25/2019   Depressive disorder 06/25/2019   Family history of congenital anomalies 05/27/2017   Herpes 05/27/2017   Hx of cesarean section 05/27/2017   Migraine with aura 05/27/2017   Agoraphobia 05/30/2016    History of abnormal cervical Pap smear 01/31/2016   History of kidney stones 01/31/2016   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)   Heart murmur    OUTGREW   History of febrile seizure    History of kidney stones    History of pneumonia    Migraines    OCD (obsessive compulsive disorder)    Oral herpes    Pneumonia    PONV (postoperative nausea and vomiting)    Postpartum depression    Pre-diabetes    Psoriasis    PTSD (post-traumatic stress disorder)    Recurrent UTI    Rosacea    Sleep apnea     Family History  Problem Relation Age of Onset   Depression Mother    Alcohol abuse Mother    Hernia Mother    Rashes / Skin problems Mother    Thyroid disease Mother    Anxiety disorder Mother    Depression Father    Alcohol abuse Father    Gout Father    Diabetes Father    Asthma Brother    Bipolar disorder Brother    Drug abuse Brother    ADD / ADHD Brother    Breast cancer Maternal Aunt      Lung cancer Maternal Aunt    Colon cancer Paternal Grandmother    Allergic rhinitis Son    Asthma Son    ADD / ADHD Son    Allergic rhinitis Son    ADD / ADHD Son    Ovarian cancer Neg Hx     Past Surgical History:  Procedure Laterality Date   ABDOMINAL SURGERY  2012   Endometrial Surgery - went through c-section scar   CARPAL TUNNEL RELEASE Right 08/07/2021   Procedure: RIGHT CARPAL TUNNEL RELEASE;  Surgeon: Neetu Carrozza, , MD;  Location: [facility];  Service: Orthopedics;  Laterality: Right;   CESAREAN SECTION  2019   CESAREAN SECTION  2007   CESAREAN SECTION  2014   CESAREAN SECTION  2016   ENDOMETRIAL BIOPSY  2011   GASTROSCHISIS CLOSURE     LEEP     x 2   VENTRAL HERNIA REPAIR N/A 12/11/2019   Procedure: HERNIA REPAIR VENTRAL ADULT;  Surgeon: Cintron-Diaz, Edgardo, MD;  Location: [facility];  Service:  General;  Laterality: N/A;   WISDOM TOOTH EXTRACTION     XI ROBOTIC ASSISTED VENTRAL HERNIA N/A 12/11/2019   Procedure: XI ROBOTIC ASSISTED VENTRAL HERNIA Lap vs open;  Surgeon: Cintron-Diaz, Edgardo, MD;  Location: [facility];  Service: General;  Laterality: N/A;   Social History   Occupational History   Occupation: nurse tech    Employer: [employer]  Tobacco Use   Smoking status: Former    Packs/day: 0.30    Years: 3.00    Pack years: 0.90    Types: Cigarettes    Quit date: 2013    Years since quitting: 10.3    Passive exposure: Past   Smokeless tobacco: Never  Vaping Use   Vaping Use: Never used  Substance and Sexual Activity   Alcohol use: Yes    Alcohol/week: 2.0 standard drinks    Types: 2 Cans of beer per week   Drug use: Never   Sexual activity: Yes    Birth control/protection: None     

## 2021-08-17 NOTE — H&P (View-Only) (Signed)
Post-Op Visit Note   Patient: Crystal Hammond           Date of Birth: 01/19/1981           MRN: [id number] Visit Date: 08/17/2021 PCP: [name], MD   Assessment & Plan:  Chief Complaint:  Chief Complaint  Patient presents with   Right Hand - Routine Post Op   Visit Diagnoses:  1. Bilateral carpal tunnel syndrome     Plan: Patient is 10 days s/p right CTR.  Her numbness has resolved.  She still has some soreness in the thenar eminence.  Her incision is clean, dry, and well approximated.  The sutures were removed.  She would like to proceed with L CTR.  We again discussed the risks of surgery including bleeding, infection, damage to neurovascular structures, incomplete symptom relief, and need for additional surgery.  A surgical date and time will be confirmed with the patient.   Follow-Up Instructions: No follow-ups on file.   Orders:  No orders of the defined types were placed in this encounter.  No orders of the defined types were placed in this encounter.   Imaging: No results found.  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   Itching 07/22/2020   Vitamin D deficiency 01/26/2020   Incisional hernia 12/11/2019   Prediabetes 07/26/2019   GAD (generalized anxiety disorder) 06/25/2019   Adult ADHD 06/25/2019   Depressive disorder 06/25/2019   Family history of congenital anomalies 05/27/2017   Herpes 05/27/2017   Hx of cesarean section 05/27/2017   Migraine with aura 05/27/2017   Agoraphobia 05/30/2016    History of abnormal cervical Pap smear 01/31/2016   History of kidney stones 01/31/2016   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)   Heart murmur    OUTGREW   History of febrile seizure    History of kidney stones    History of pneumonia    Migraines    OCD (obsessive compulsive disorder)    Oral herpes    Pneumonia    PONV (postoperative nausea and vomiting)    Postpartum depression    Pre-diabetes    Psoriasis    PTSD (post-traumatic stress disorder)    Recurrent UTI    Rosacea    Sleep apnea     Family History  Problem Relation Age of Onset   Depression Mother    Alcohol abuse Mother    Hernia Mother    Rashes / Skin problems Mother    Thyroid disease Mother    Anxiety disorder Mother    Depression Father    Alcohol abuse Father    Gout Father    Diabetes Father    Asthma Brother    Bipolar disorder Brother    Drug abuse Brother    ADD / ADHD Brother    Breast cancer Maternal Aunt  Lung cancer Maternal Aunt    Colon cancer Paternal Grandmother    Allergic rhinitis Son    Asthma Son    ADD / ADHD Son    Allergic rhinitis Son    ADD / ADHD Son    Ovarian cancer Neg Hx     Past Surgical History:  Procedure Laterality Date   ABDOMINAL SURGERY  2012   Endometrial Surgery - went through c-section scar   CARPAL TUNNEL RELEASE Right 08/07/2021   Procedure: RIGHT CARPAL TUNNEL RELEASE;  Surgeon: Sherilyn Cooter, MD;  Location: [facility];  Service: Orthopedics;  Laterality: Right;   CESAREAN SECTION  2019   CESAREAN SECTION  2007   CESAREAN SECTION  2014   CESAREAN SECTION  2016   ENDOMETRIAL BIOPSY  2011   GASTROSCHISIS CLOSURE     LEEP     x 2   VENTRAL HERNIA REPAIR N/A 12/11/2019   Procedure: HERNIA REPAIR VENTRAL ADULT;  Surgeon: Herbert Pun, MD;  Location: [facility];  Service:  General;  Laterality: N/A;   WISDOM TOOTH EXTRACTION     XI ROBOTIC ASSISTED VENTRAL HERNIA N/A 12/11/2019   Procedure: XI ROBOTIC ASSISTED VENTRAL HERNIA Lap vs open;  Surgeon: Herbert Pun, MD;  Location: [facility];  Service: General;  Laterality: N/A;   Social History   Occupational History   Occupation: Research scientist (life sciences): [employer]  Tobacco Use   Smoking status: Former    Packs/day: 0.30    Years: 3.00    Pack years: 0.90    Types: Cigarettes    Quit date: 2013    Years since quitting: 10.3    Passive exposure: Past   Smokeless tobacco: Never  Vaping Use   Vaping Use: Never used  Substance and Sexual Activity   Alcohol use: Yes    Alcohol/week: 2.0 standard drinks    Types: 2 Cans of beer per week   Drug use: Never   Sexual activity: Yes    Birth control/protection: None

## 2021-08-18 ENCOUNTER — Other Ambulatory Visit: Payer: Self-pay | Admitting: Family Medicine

## 2021-08-18 ENCOUNTER — Other Ambulatory Visit: Payer: Self-pay

## 2021-08-21 ENCOUNTER — Other Ambulatory Visit: Payer: Self-pay

## 2021-08-21 NOTE — Telephone Encounter (Signed)
Refill request for amphetamine-dextroamphetamine (ADDERALL XR) 30 MG 24 hr capsule ? ?LOV - 07/07/21 ?Next OV - not scheduled ?Last refill - 07/07/21 #30/0 ? ?

## 2021-08-22 ENCOUNTER — Other Ambulatory Visit: Payer: Self-pay

## 2021-08-22 MED FILL — Amphetamine-Dextroamphetamine Cap ER 24HR 30 MG: ORAL | 30 days supply | Qty: 30 | Fill #0 | Status: AC

## 2021-08-23 ENCOUNTER — Other Ambulatory Visit: Payer: Self-pay

## 2021-08-23 MED ORDER — COVID-19 AT HOME ANTIGEN TEST VI KIT
PACK | 0 refills | Status: DC
  Filled 2021-08-23: qty 2, 4d supply, fill #0

## 2021-08-24 ENCOUNTER — Other Ambulatory Visit: Payer: Self-pay

## 2021-08-24 ENCOUNTER — Encounter (HOSPITAL_BASED_OUTPATIENT_CLINIC_OR_DEPARTMENT_OTHER): Payer: Self-pay | Admitting: Orthopedic Surgery

## 2021-08-25 NOTE — Telephone Encounter (Signed)
I called and spoke with the pt and scheduled for Appt with NM for 10/24/21.  ?

## 2021-08-30 ENCOUNTER — Ambulatory Visit (HOSPITAL_BASED_OUTPATIENT_CLINIC_OR_DEPARTMENT_OTHER)
Admission: RE | Admit: 2021-08-30 | Discharge: 2021-08-30 | Disposition: A | Discharge status: Home / Self Care | Payer: 59 | Attending: Orthopedic Surgery | Admitting: Orthopedic Surgery

## 2021-08-30 ENCOUNTER — Encounter (HOSPITAL_BASED_OUTPATIENT_CLINIC_OR_DEPARTMENT_OTHER): Payer: Self-pay | Admitting: Orthopedic Surgery

## 2021-08-30 ENCOUNTER — Other Ambulatory Visit: Payer: Self-pay

## 2021-08-30 ENCOUNTER — Ambulatory Visit (HOSPITAL_BASED_OUTPATIENT_CLINIC_OR_DEPARTMENT_OTHER): Payer: 59 | Admitting: Anesthesiology

## 2021-08-30 ENCOUNTER — Encounter (HOSPITAL_BASED_OUTPATIENT_CLINIC_OR_DEPARTMENT_OTHER)
Admission: RE | Disposition: A | Discharge status: Home / Self Care | Payer: Self-pay | Source: Home / Self Care | Attending: Orthopedic Surgery

## 2021-08-30 DIAGNOSIS — G5602 Carpal tunnel syndrome, left upper limb: Secondary | ICD-10-CM | POA: Diagnosis not present

## 2021-08-30 DIAGNOSIS — E669 Obesity, unspecified: Secondary | ICD-10-CM

## 2021-08-30 DIAGNOSIS — Z6836 Body mass index (BMI) 36.0-36.9, adult: Secondary | ICD-10-CM

## 2021-08-30 DIAGNOSIS — G4730 Sleep apnea, unspecified: Secondary | ICD-10-CM | POA: Diagnosis not present

## 2021-08-30 DIAGNOSIS — Z87891 Personal history of nicotine dependence: Secondary | ICD-10-CM | POA: Insufficient documentation

## 2021-08-30 DIAGNOSIS — F418 Other specified anxiety disorders: Secondary | ICD-10-CM

## 2021-08-30 DIAGNOSIS — G5601 Carpal tunnel syndrome, right upper limb: Secondary | ICD-10-CM

## 2021-08-30 DIAGNOSIS — R7303 Prediabetes: Secondary | ICD-10-CM

## 2021-08-30 HISTORY — PX: CARPAL TUNNEL RELEASE: SHX101

## 2021-08-30 LAB — POCT PREGNANCY, URINE: Preg Test, Ur: NEGATIVE

## 2021-08-30 SURGERY — CARPAL TUNNEL RELEASE
Anesthesia: Monitor Anesthesia Care | Site: Hand | Laterality: Left

## 2021-08-30 MED ORDER — LIDOCAINE 2% (20 MG/ML) 5 ML SYRINGE
INTRAMUSCULAR | Status: DC | PRN
  Administered 2021-08-30: 60 mg via INTRAVENOUS

## 2021-08-30 MED ORDER — MEPERIDINE HCL 25 MG/ML IJ SOLN: 6.2500 mg | INTRAMUSCULAR | Status: DC | PRN

## 2021-08-30 MED ORDER — FENTANYL CITRATE (PF) 100 MCG/2ML IJ SOLN
INTRAMUSCULAR | Status: DC | PRN
Start: 2021-08-30 — End: 2021-08-30
  Administered 2021-08-30 (×2): 50 ug via INTRAVENOUS

## 2021-08-30 MED ORDER — ONDANSETRON HCL 4 MG/2ML IJ SOLN
INTRAMUSCULAR | Status: DC | PRN
Start: 2021-08-30 — End: 2021-08-30
  Administered 2021-08-30: 4 mg via INTRAVENOUS

## 2021-08-30 MED ORDER — ACETAMINOPHEN 160 MG/5ML PO SOLN: 325.0000 mg | ORAL | Status: DC | PRN

## 2021-08-30 MED ORDER — OXYCODONE HCL 5 MG/5ML PO SOLN: 5.0000 mg | Freq: Once | ORAL | Status: DC | PRN

## 2021-08-30 MED ORDER — FENTANYL CITRATE (PF) 100 MCG/2ML IJ SOLN: 25.0000 ug | INTRAMUSCULAR | Status: DC | PRN

## 2021-08-30 MED ORDER — FENTANYL CITRATE (PF) 100 MCG/2ML IJ SOLN
INTRAMUSCULAR | Status: AC
  Filled 2021-08-30: qty 2

## 2021-08-30 MED ORDER — MIDAZOLAM HCL 2 MG/2ML IJ SOLN
INTRAMUSCULAR | Status: AC
  Filled 2021-08-30: qty 2

## 2021-08-30 MED ORDER — PROPOFOL 10 MG/ML IV BOLUS
INTRAVENOUS | Status: DC | PRN
  Administered 2021-08-30: 20 mg via INTRAVENOUS

## 2021-08-30 MED ORDER — LACTATED RINGERS IV SOLN: INTRAVENOUS | Status: DC

## 2021-08-30 MED ORDER — OXYCODONE HCL 5 MG PO TABS: 5.0000 mg | ORAL_TABLET | Freq: Once | ORAL | Status: DC | PRN

## 2021-08-30 MED ORDER — PROPOFOL 500 MG/50ML IV EMUL
INTRAVENOUS | Status: DC | PRN
  Administered 2021-08-30: 200 ug/kg/min via INTRAVENOUS

## 2021-08-30 MED ORDER — KETOROLAC TROMETHAMINE 30 MG/ML IJ SOLN
INTRAMUSCULAR | Status: DC | PRN
  Administered 2021-08-30: 30 mg via INTRAVENOUS

## 2021-08-30 MED ORDER — MIDAZOLAM HCL 5 MG/5ML IJ SOLN
INTRAMUSCULAR | Status: DC | PRN
  Administered 2021-08-30: 2 mg via INTRAVENOUS

## 2021-08-30 MED ORDER — LACTATED RINGERS IV SOLN: INTRAVENOUS | Status: DC | PRN

## 2021-08-30 MED ORDER — 0.9 % SODIUM CHLORIDE (POUR BTL) OPTIME
TOPICAL | Status: DC | PRN
  Administered 2021-08-30: 60 mL

## 2021-08-30 MED ORDER — ONDANSETRON HCL 4 MG/2ML IJ SOLN: 4.0000 mg | Freq: Once | INTRAMUSCULAR | Status: DC | PRN

## 2021-08-30 MED ORDER — OXYCODONE HCL 5 MG PO TABS
5.0000 mg | ORAL_TABLET | Freq: Four times a day (QID) | ORAL | 0 refills | Status: AC | PRN
  Filled 2021-08-30: qty 12, 3d supply, fill #0

## 2021-08-30 MED ORDER — LIDOCAINE HCL (PF) 1 % IJ SOLN
INTRAMUSCULAR | Status: DC | PRN
  Administered 2021-08-30: 10 mL

## 2021-08-30 MED ORDER — KETOROLAC TROMETHAMINE 30 MG/ML IJ SOLN: 30.0000 mg | Freq: Once | INTRAMUSCULAR | Status: DC | PRN

## 2021-08-30 MED ORDER — ACETAMINOPHEN 325 MG PO TABS: 325.0000 mg | ORAL_TABLET | ORAL | Status: DC | PRN

## 2021-08-30 SURGICAL SUPPLY — 31 items
APL PRP STRL LF DISP 70% ISPRP (MISCELLANEOUS) ×2
BLADE SURG 15 STRL LF DISP TIS (BLADE) ×1 IMPLANT
BLADE SURG 15 STRL SS (BLADE) ×2
BNDG CMPR 9X4 STRL LF SNTH (GAUZE/BANDAGES/DRESSINGS) ×1
BNDG ELASTIC 3X5.8 VLCR STR LF (GAUZE/BANDAGES/DRESSINGS) ×2 IMPLANT
BNDG ESMARK 4X9 LF (GAUZE/BANDAGES/DRESSINGS) ×2 IMPLANT
BNDG GAUZE ELAST 4 BULKY (GAUZE/BANDAGES/DRESSINGS) ×2 IMPLANT
CHLORAPREP W/TINT 26 (MISCELLANEOUS) ×3 IMPLANT
CORD BIPOLAR FORCEPS 12FT (ELECTRODE) ×2 IMPLANT
COVER BACK TABLE 60X90IN (DRAPES) ×2 IMPLANT
COVER MAYO STAND STRL (DRAPES) ×2 IMPLANT
DRAPE EXTREMITY T 121X128X90 (DISPOSABLE) ×2 IMPLANT
DRAPE U-SHAPE 47X51 STRL (DRAPES) ×1 IMPLANT
DRAPE U-SHAPE 48X52 POLY STRL (PACKS) ×1 IMPLANT
GAUZE XEROFORM 1X8 LF (GAUZE/BANDAGES/DRESSINGS) ×1 IMPLANT
GLOVE BIO SURGEON STRL SZ7 (GLOVE) ×3 IMPLANT
GLOVE BIOGEL PI IND STRL 7.0 (GLOVE) ×1 IMPLANT
GLOVE BIOGEL PI INDICATOR 7.0 (GLOVE) ×1
GOWN STRL REUS W/ TWL LRG LVL3 (GOWN DISPOSABLE) ×1 IMPLANT
GOWN STRL REUS W/TWL LRG LVL3 (GOWN DISPOSABLE) ×2
GOWN STRL REUS W/TWL XL LVL3 (GOWN DISPOSABLE) ×2 IMPLANT
NDL HYPO 25X1 1.5 SAFETY (NEEDLE) IMPLANT
NEEDLE HYPO 25X1 1.5 SAFETY (NEEDLE) ×2 IMPLANT
NS IRRIG 1000ML POUR BTL (IV SOLUTION) ×2 IMPLANT
PACK BASIN DAY SURGERY FS (CUSTOM PROCEDURE TRAY) ×2 IMPLANT
PAD CAST 3X4 CTTN HI CHSV (CAST SUPPLIES) ×1 IMPLANT
PADDING CAST COTTON 3X4 STRL (CAST SUPPLIES) ×2
SUT ETHILON 4 0 PS 2 18 (SUTURE) ×2 IMPLANT
SYR BULB EAR ULCER 3OZ GRN STR (SYRINGE) ×2 IMPLANT
SYR CONTROL 10ML LL (SYRINGE) ×1 IMPLANT
TOWEL GREEN STERILE FF (TOWEL DISPOSABLE) ×3 IMPLANT

## 2021-08-30 NOTE — Anesthesia Procedure Notes (Signed)
Procedure Name: [place] ?Date/Time: 08/30/2021 12:33 PM ?Performed by: Ezequiel Kayser, CRNA ?Pre-anesthesia Checklist: Patient identified, Emergency Drugs available, Suction available, Patient being monitored and Timeout performed ?Patient Re-evaluated:Patient Re-evaluated prior to induction ?Oxygen Delivery Method: Simple face mask ? ? ? ? ?

## 2021-08-30 NOTE — Anesthesia Preprocedure Evaluation (Signed)
Anesthesia Evaluation  ?Patient identified by MRN, date of birth, ID band ?Patient awake ? ? ? ?Reviewed: ?Allergy & Precautions, H&P , NPO status , Patient's Chart, lab work & pertinent test results, reviewed documented beta blocker date and time  ? ?History of Anesthesia Complications ?(+) PONV and history of anesthetic complications (PONV, has not previously had scopolamine patch to her knowledge. Also reports "low blood pressure" with anesthesia) ? ?Airway ?Mallampati: III ? ?TM Distance: >3 FB ? ? ? ? Dental ? ?(+) Teeth Intact ?  ?Pulmonary ?sleep apnea , neg COPD, former smoker,  ?  ?Pulmonary exam normal ? ? ? ? ? ? ? Cardiovascular ?(-) angina(-) Past MI and (-) Cardiac Stents Normal cardiovascular exam(-) dysrhythmias + Valvular Problems/Murmurs  ? ? ?  ?Neuro/Psych ? Headaches, PSYCHIATRIC DISORDERS Anxiety Depression OCD  ? GI/Hepatic ?negative GI ROS, Neg liver ROS,   ?Endo/Other  ?negative endocrine ROS ? Renal/GU ?  ? ?  ?Musculoskeletal ? ? Abdominal ?(+) + obese,   ?Peds ? Hematology ?negative hematology ROS ?(+)   ?Anesthesia Other Findings ? ? ? ? ? Reproductive/Obstetrics ?negative OB ROS ? ?  ? ? ? ? ? ? ? ? ? ? ? ? ? ?  ?  ? ? ? ? ? ? ? ? ?Anesthesia Physical ? ?Anesthesia Plan ? ?ASA: 2 ? ?Anesthesia Plan: MAC  ? ?Post-op Pain Management:   ? ?Induction:  ? ?PONV Risk Score and Plan: 3 and Ondansetron, Propofol infusion, TIVA, Midazolam and Treatment may vary due to age or medical condition ? ?Airway Management Planned: Natural Airway, Simple Face Mask and Nasal Cannula ? ?Additional Equipment: None ? ?Intra-op Plan:  ? ?Post-operative Plan:  ? ?Informed Consent: I have reviewed the patients History and Physical, chart, labs and discussed the procedure including the risks, benefits and alternatives for the proposed anesthesia with the patient or authorized representative who has indicated his/her understanding and acceptance.  ? ? ? ?Dental advisory  given ? ?Plan Discussed with: CRNA ? ?Anesthesia Plan Comments:   ? ? ? ? ? ? ?Anesthesia Quick Evaluation ? ?

## 2021-08-30 NOTE — Anesthesia Postprocedure Evaluation (Signed)
Anesthesia Post Note ? ?Patient: Crystal Hammond ? ?Procedure(s) Performed: LEFT CARPAL TUNNEL RELEASE (Left: Hand) ? ?  ? ?Patient location during evaluation: Phase II ?Anesthesia Type: MAC ?Level of consciousness: awake ?Pain management: pain level controlled ?Vital Signs Assessment: post-procedure vital signs reviewed and stable ?Respiratory status: spontaneous breathing ?Cardiovascular status: stable ?Postop Assessment: no apparent nausea or vomiting ?Anesthetic complications: no ? ? ?No notable events documented. ? ?Last Vitals:  ?Vitals:  ? 08/30/21 1316 08/30/21 1330  ?BP:  130/72  ?Pulse:  66  ?Resp:  19  ?Temp:    ?SpO2: 100% 95%  ?  ?Last Pain:  ?Vitals:  ? 08/30/21 1330  ?TempSrc:   ?PainSc: 0-No pain  ? ? ?  ?  ?  ?  ?  ?  ? ?Huston Foley ? ? ? ? ?

## 2021-08-30 NOTE — Discharge Instructions (Addendum)
 Crystal Hammond, M.D. Hand Surgery  POST-OPERATIVE DISCHARGE INSTRUCTIONS   PRESCRIPTIONS: - You have been given a prescription to be taken as directed for post-operative pain control.  You may also take over the counter ibuprofen/aleve and tylenol for pain. Take this as directed on the packaging. Do not exceed 3000 mg tylenol/acetaminophen in 24 hours.  Ibuprofen 600-800 mg (3-4) tablets by mouth every 6 hours as needed for pain.   OR  Aleve 2 tablets by mouth every 12 hours (twice daily) as needed for pain.   AND/OR  Tylenol 1000 mg (2 tablets) every 8 hours as needed for pain.  - Please use your pain medication carefully, as refills are limited and you may not be provided with one.  As stated above, please use over the counter pain medicine - it will also be helpful with decreasing your swelling.    ANESTHESIA: -After your surgery, post-surgical discomfort or pain is likely. This discomfort can last several days to a few weeks. At certain times of the day your discomfort may be more intense.   Did you receive a nerve block?   - A nerve block can provide pain relief for one hour to two days after your surgery. As long as the nerve block is working, you will experience little or no sensation in the area the surgeon operated on.  - As the nerve block wears off, you will begin to experience pain or discomfort. It is very important that you begin taking your prescribed pain medication before the nerve block fully wears off. Treating your pain at the first sign of the block wearing off will ensure your pain is better controlled and more tolerable when full-sensation returns. Do not wait until the pain is intolerable, as the medicine will be less effective. It is better to treat pain in advance than to try and catch up.   General Anesthesia:  If you did not receive a nerve block during your surgery, you will need to start taking your pain medication shortly after your surgery and  should continue to do so as prescribed by your surgeon.     ICE AND ELEVATION: - You may use ice for the first 48-72 hours, but it is not critical.   - Motion of your fingers is very important to decrease the swelling.  - Elevation, as much as possible for the next 48 hours, is critical for decreasing swelling as well as for pain relief. Elevation means when you are seated or lying down, you hand should be at or above your heart. When walking, the hand needs to be at or above the level of your elbow.  - If the bandage gets too tight, it may need to be loosened. Please contact our office and we will instruct you in how to do this.    SURGICAL BANDAGES:  - Keep your dressing and/or splint clean and dry at all times.  You can remove your dressing 4 days from now and change with a dry dressing or Band-Aids as needed thereafter. - You may place a plastic bag over your bandage to shower, but be careful, do not get your bandages wet.  - After the bandages have been removed, it is OK to get the stitches wet in a shower or with hand washing. Do Not soak or submerge the wound yet. Please do not use lotions or creams on the stitches.      HAND THERAPY:  - You may not need any. If you do,   we will begin this at your follow up visit in the clinic.  ? ? ?ACTIVITY AND WORK: ?- You are encouraged to move any fingers which are not in the bandage.  ?- Light use of the fingers is allowed to assist the other hand with daily hygiene and eating, but strong gripping or lifting is often uncomfortable and should be avoided.  ?- You might miss a variable period of time from work and hopefully this issue has been discussed prior to surgery. You may not do any heavy work with your affected hand for about 2 weeks.  ? ? ?[facility] ?[address] ?[place],  [place]  [address] ?[phone]  ? ?May take NSAIDS (ibuprofen, motrin) after 7pm, if needed.  ? ? ?Post Anesthesia Home Care  Instructions ? ?Activity: ?Get plenty of rest for the remainder of the day. A responsible individual must stay with you for 24 hours following the procedure.  ?For the next 24 hours, DO NOT: ?-Drive a car ?-Advertising copywriter ?-Drink alcoholic beverages ?-Take any medication unless instructed by your physician ?-Make any legal decisions or sign important papers. ? ?Meals: ?Start with liquid foods such as gelatin or soup. Progress to regular foods as tolerated. Avoid greasy, spicy, heavy foods. If nausea and/or vomiting occur, drink only clear liquids until the nausea and/or vomiting subsides. Call your physician if vomiting continues. ? ?Special Instructions/Symptoms: ?Your throat may feel dry or sore from the anesthesia or the breathing tube placed in your throat during surgery. If this causes discomfort, gargle with warm salt water. The discomfort should disappear within 24 hours. ? ?If you had a scopolamine patch placed behind your ear for the management of post- operative nausea and/or vomiting: ? ?1. The medication in the patch is effective for 72 hours, after which it should be removed.  Wrap patch in a tissue and discard in the trash. Wash hands thoroughly with soap and water. ?2. You may remove the patch earlier than 72 hours if you experience unpleasant side effects which may include dry mouth, dizziness or visual disturbances. ?3. Avoid touching the patch. Wash your hands with soap and water after contact with the patch. ?    ?

## 2021-08-30 NOTE — Op Note (Signed)
? ?  Date of Surgery: 08/30/2021 ? ?INDICATIONS: Patient is a 41 y.o.-year-old female with bilateral carpal tunnel syndrome that has failed conservative management.  She underwent right carpal tunnel release on 08/07/21 with significant symptom improvement.  She presents today for left carpal tunnel release.  Risks, benefits, and alternatives to surgery were again discussed with the patient in the preoperative area. The patient wishes to proceed with surgery.  Informed consent was signed after our discussion.  ? ?PREOPERATIVE DIAGNOSIS:  ?Left carpal tunnel release ? ?POSTOPERATIVE DIAGNOSIS: Same. ? ?PROCEDURE:  ?Left carpal tunnel syndrome ? ? ?SURGEON: Audria Nine, M.D. ? ?ASSIST:  ? ?ANESTHESIA:  Local, MAC ? ?IV FLUIDS AND URINE: See anesthesia. ? ?ESTIMATED BLOOD LOSS: <5 mL. ? ?IMPLANTS: * No implants in log *  ? ?DRAINS: None ? ?COMPLICATIONS: None ? ?DESCRIPTION OF PROCEDURE: The patient was met in the preoperative holding area where the surgical site was marked and the consent form was verified.  The patient was then taken to the operating room and transferred to the operating table.  All bony prominences were well padded.  A tourniquet was applied to the left forearm.  Monitored sedation was induced.   A formal time-out was performed to confirm that this was the correct patient, surgery, side, and site. Following timeout, a local block was performed using 10cc of 1% plain lidocaine. The operative extremity was prepped and draped in the usual and sterile fashion.   ? ?Following a second timeout, the limb was exsanguinated and the tourniquet inflated to 250 mmHg.  A longitudinal incision was made in line with the radial border of the ring finger from distal to the wrist flexion crease to the intersection of Kaplan's cardinal line.  The skin and subcutaneous tissue was sharply divided.  The longitudinally running palmar fascia was incised.  The thenar musculature was bluntly swept off of the transverse  carpal ligament.  The ligament was divided from proximal to distal until the fat surrounding the palmar arch was encountered. Retractors were then placed in the proximal aspect of the wound to visualize the distal antebrachial fascia.  The fascia was sharply divided under direct visualization.   The wound was then thoroughly irrigated with sterile saline.  The tourniquet was deflated.  Hemostasis was achieved with direct pressure and bipolar electrocautery.  The wound was then closed with 4-0 nylon sutures in a horizontal mattress fashion. The wound was then dressed with xeroform, folded kerlix, and an ace wrap. ? ?The patient was then reversed from anesthesia and transferred to the postoperative bed.  All counts were correct x 2 at the end of the procedure.  The patient was taken to the recovery unit in stable condition.    ? ?POSTOPERATIVE PLAN: She will be discharged to home with appropriate pain medication and discharge instructions.  I will see her in the office in 10-14 days for her first postop visit.  ? ?Audria Nine, MD ?1:06 PM  ?

## 2021-08-30 NOTE — Transfer of Care (Signed)
Immediate Anesthesia Transfer of Care Note ? ?Patient: Crystal Hammond ? ?Procedure(s) Performed: LEFT CARPAL TUNNEL RELEASE (Left: Hand) ? ?Patient Location: PACU ? ?Anesthesia Type:MAC ? ?Level of Consciousness: drowsy ? ?Airway & Oxygen Therapy: Patient Spontanous Breathing and Patient connected to face mask oxygen ? ?Post-op Assessment: Report given to RN, Post -op Vital signs reviewed and stable and Patient moving all extremities X 4 ? ?Post vital signs: Reviewed and stable ? ?Last Vitals:  ?Vitals Value Taken Time  ?BP 134/68 08/30/21 1307  ?Temp    ?Pulse 69 08/30/21 1308  ?Resp 23 08/30/21 1308  ?SpO2 99 % 08/30/21 1308  ?Vitals shown include unvalidated device data. ? ?Last Pain:  ?Vitals:  ? 08/30/21 1153  ?TempSrc: Oral  ?PainSc: 0-No pain  ?   ? ?Patients Stated Pain Goal: 3 (08/30/21 1153) ? ?Complications: No notable events documented. ?

## 2021-08-31 ENCOUNTER — Encounter (HOSPITAL_BASED_OUTPATIENT_CLINIC_OR_DEPARTMENT_OTHER): Payer: Self-pay | Admitting: Orthopedic Surgery

## 2021-09-01 NOTE — Interval H&P Note (Signed)
History and Physical Interval Note:  09/01/2021 9:34 AM  [name]  has presented today for surgery, with the diagnosis of LEFT CARPAL TUNNEL SYNDROME.  The various methods of treatment have been discussed with the patient and family. After consideration of risks, benefits and other options for treatment, the patient has consented to  Procedure(s): LEFT CARPAL TUNNEL RELEASE (Left) as a surgical intervention.  The patient's history has been reviewed, patient examined, no change in status, stable for surgery.  I have reviewed the patient's chart and labs.  Questions were answered to the patient's satisfaction.      Crystal Hammond

## 2021-09-05 ENCOUNTER — Ambulatory Visit (INDEPENDENT_AMBULATORY_CARE_PROVIDER_SITE_OTHER): Payer: 59 | Admitting: Psychologist

## 2021-09-05 DIAGNOSIS — F331 Major depressive disorder, recurrent, moderate: Secondary | ICD-10-CM | POA: Diagnosis not present

## 2021-09-05 DIAGNOSIS — F4310 Post-traumatic stress disorder, unspecified: Secondary | ICD-10-CM | POA: Diagnosis not present

## 2021-09-05 DIAGNOSIS — Z634 Disappearance and death of family member: Secondary | ICD-10-CM | POA: Diagnosis not present

## 2021-09-05 NOTE — Plan of Care (Signed)
Goals Reduce overall frequency, intensity, and duration of trauma related symptoms Stabilize  trauma while increasing ability to function Enhance ability to effectively cope with triggers, reminders, flashbacks, moods, and cognitions of trauma Learn and implement coping skills that result in a reduction of trauma related symptomsl  Objectives Verbalize an understanding of the cognitive, physiological, and behavioral components of post traumatic stress disorder Describe in detail reminders of the triggers related to trauma  Learn and implement strategies to manage thoughts, behaviors, and feelings of trauma Learning and implement calming skills to reduce overall trauma Verbalize an understanding of the role that cognitive biases play in excessive irrational worry and persistent trauma symptoms Learn and implement personal skills to manage challenging situations Learn and implement problem solving strategies Identify and engage in pleasant activities Learn to accept limitations in life and commit to tolerating, rather than avoiding, unpleasant emotions while accomplishing meaningful goals Maintain involvement in work, family, and social activities Reestablish a consistent sleep-wake cycle Cooperate with a medical evaluation  Interventions Engage the patient in behavioral activation Use instruction, modeling, and role-playing to build the client's general social, communication, and/or conflict resolution skills Use Acceptance and Commitment Therapy to help client accept uncomfortable realities in order to accomplish value-consistent goals Support the client in following through with work, family, and social activities Teach and implement sleep hygiene practices  Refer the patient to a physician for a psychotropic medication consultation Monito the clint's psychotropic medication compliance Discuss how trauma typically involves excessive worry, various bodily expressions of tension, and  avoidance of what is threatening that interact to maintain the problem  Teach the patient relaxation skills Assign the patient homework Discuss examples demonstrating that unrealistic worry overestimates the probability of threats and underestimates patient's ability  Assist the patient in analyzing his or her worries Help patient understand that avoidance is reinforcing   Goals Begin a healthy grieving process Objectives Tell in detail the story of the current loss that is triggering symptoms Read books on the topic of grief Watch videos on the theme of grief Begin verbalizing feelings associated with the loss Attend a grief support group express thoughts and feelings about the deceased Identify and voice positives about the deceased implement acts of spiritual faith  Interventions create a safe environment and actively build trust use empathy, compassion, and support ask the patient to write a letter to the lost person conduct empty chair ask the patient to discuss and list the positives and negative aspects of the person encourage patient to rely upon his/her spiritual faith  ask client to read books on grief ask patient to watch videos about grief assist patient in identifying emotions  ask patient to attend support group   Goals Alleviate depressive symptoms Recognize, accept, and cope with depressive feelings Develop healthy thinking patterns Develop healthy interpersonal relationships  Objectives Cooperate with a medication evaluation by a physician Verbalize an accurate understanding of depression Verbalize an understanding of the treatment Identify and replace thoughts that support depression Learn and implement behavioral strategies Verbalize an understanding and resolution of current interpersonal problems Learn and implement problem solving and decision making skills Learn and implement conflict resolution skills to resolve interpersonal problems Verbalize an  understanding of healthy and unhealthy emotions verbalize insight into how past relationships may be influence current experiences with depression Use mindfulness and acceptance strategies and increase value based behavior  Increase hopeful statements about the future.  Interventions Consistent with treatment model, discuss how change in cognitive, behavioral, and interpersonal can help  client alleviate depression CBT Behavioral activation help the client explore the relationship, nature of the dispute,  Help the client develop new interpersonal skills and relationships Conduct Problem so living therapy Teach conflict resolution skills Use a process-experiential approach Conduct TLDP Conduct ACT Evaluate need for psychotropic medication Monitor adherence to medication
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
## 2021-09-05 NOTE — Progress Notes (Signed)
                Jeison Delpilar, PsyD 

## 2021-09-06 NOTE — Progress Notes (Unsigned)
   CC:  headaches  Follow-up Visit  Last visit: 05/24/21  Brief HPI: 41 year old female with a history of GAD, ADHD, prediabetes, kidney stones who follows in clinic for migraine with visual aura.  At her last visit propranolol was added for headache prevention. Zomig was prescribed for rescue  Interval History: ***   Headache days per month: *** Headache free days per month: *** Headache severity: ***  Current Headache Regimen: Preventative: *** Abortive: ***  # of doses of abortive medications per month: ***  Prior Therapies                                  Sumatriptan 50 mg PRN - drowsiness, dizziness Zomig Flexeril 5 mg PRN Nortriptyline 25 mg QHS Propranolol Topamax contraindicated due to kidney stones  Physical Exam:   Vital Signs: There were no vitals taken for this visit. GENERAL:  well appearing, in no acute distress, alert  SKIN:  Color, texture, turgor normal. No rashes or lesions HEAD:  Normocephalic/atraumatic. RESP: normal respiratory effort MSK:  No gross joint deformities.   NEUROLOGICAL: Mental Status: Alert, oriented to person, place and time, Follows commands, and Speech fluent and appropriate. Cranial Nerves: PERRL, face symmetric, no dysarthria, hearing grossly intact Motor: moves all extremities equally Gait: normal-based.  IMPRESSION: ***  PLAN: ***   Follow-up: ***  I spent a total of *** minutes on the date of the service. Headache education was done. Discussed lifestyle modification including increased oral hydration, decreased caffeine, exercise and stress management. Discussed treatment options including preventive and acute medications, natural supplements, and infusion therapy. Discussed medication overuse headache and to limit use of acute treatments to no more than 2 days/week or 10 days/month. Discussed medication side effects, adverse reactions and drug interactions. Written educational materials and patient instructions  outlining all of the above were given.  Ocie Doyne, MD

## 2021-09-07 ENCOUNTER — Encounter: Payer: Self-pay | Admitting: Psychiatry

## 2021-09-07 ENCOUNTER — Ambulatory Visit: Payer: 59 | Admitting: Psychiatry

## 2021-09-08 ENCOUNTER — Encounter: Payer: 59 | Admitting: Orthopedic Surgery

## 2021-09-10 DIAGNOSIS — G4733 Obstructive sleep apnea (adult) (pediatric): Secondary | ICD-10-CM | POA: Diagnosis not present

## 2021-09-12 ENCOUNTER — Other Ambulatory Visit: Payer: Self-pay

## 2021-09-12 ENCOUNTER — Ambulatory Visit (INDEPENDENT_AMBULATORY_CARE_PROVIDER_SITE_OTHER): Payer: 59 | Admitting: Orthopedic Surgery

## 2021-09-12 DIAGNOSIS — G5602 Carpal tunnel syndrome, left upper limb: Secondary | ICD-10-CM

## 2021-09-12 MED ORDER — CEPHALEXIN 500 MG PO CAPS
500.0000 mg | ORAL_CAPSULE | Freq: Four times a day (QID) | ORAL | 0 refills | Status: AC
  Filled 2021-09-12: qty 28, 7d supply, fill #0

## 2021-09-12 NOTE — Progress Notes (Signed)
Post-Op Visit Note   Patient: Crystal Hammond           Date of Birth: 1981-03-27           MRN: [id number] Visit Date: 09/12/2021 PCP: [name], MD   Assessment & Plan:  Chief Complaint:  Chief Complaint  Patient presents with   Left Hand - Routine Post Op    Carpal Tunnel Release 08/30/21, per patient, states that she started having severe pain in it, she wasn't able to open her hand, or able to sleep. She states that after she cleaned it with alcohol and betadine her husband took out her sutures and it felt better after,     Visit Diagnoses:  1. Carpal tunnel syndrome, left upper limb     Plan: Patient is just under 2 weeks s/p left carpal tunnel release. Her numbness and nocturnal symptoms have resolved. She developed erythema around the sutures w/ scant seropurulent drainage from the proximal suture line.  There is no drainage today.  Per patient and husband, the erythema has improved.  This seems like a superficial suture abscess versus a deeper infection.  I will prescribe a short course of oral antibiotics and I'll see her again next week for a wound check.   Follow-Up Instructions: No follow-ups on file.   Orders:  No orders of the defined types were placed in this encounter.  No orders of the defined types were placed in this encounter.   Imaging: No results found.  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   Itching 07/22/2020   Vitamin D deficiency 01/26/2020    Incisional hernia 12/11/2019   Prediabetes 07/26/2019   GAD (generalized anxiety disorder) 06/25/2019   Adult ADHD 06/25/2019   Depressive disorder 06/25/2019   Family history of congenital anomalies 05/27/2017   Herpes 05/27/2017   Hx of cesarean section 05/27/2017   Migraine with aura 05/27/2017   Agoraphobia 05/30/2016   History of abnormal cervical Pap smear 01/31/2016   History of kidney stones 01/31/2016   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)   Heart murmur    OUTGREW   History of febrile seizure    History of kidney stones    History of pneumonia    Migraines    OCD (obsessive compulsive disorder)    Oral herpes    Pneumonia    PONV (postoperative nausea and vomiting)    Postpartum depression    Pre-diabetes    Psoriasis    PTSD (post-traumatic stress disorder)    Recurrent UTI    Rosacea    Sleep apnea     Family History  Problem Relation Age of Onset   Depression Mother    Alcohol abuse Mother    Hernia Mother    Rashes / Skin problems Mother  Thyroid disease Mother    Anxiety disorder Mother    Depression Father    Alcohol abuse Father    Gout Father    Diabetes Father    Asthma Brother    Bipolar disorder Brother    Drug abuse Brother    ADD / ADHD Brother    Breast cancer Maternal Aunt    Lung cancer Maternal Aunt    Colon cancer Paternal Grandmother    Allergic rhinitis Son    Asthma Son    ADD / ADHD Son    Allergic rhinitis Son    ADD / ADHD Son    Ovarian cancer Neg Hx     Past Surgical History:  Procedure Laterality Date   ABDOMINAL SURGERY  2012   Endometrial Surgery - went through c-section scar   CARPAL TUNNEL RELEASE Right 08/07/2021   Procedure: RIGHT CARPAL TUNNEL RELEASE;  Surgeon: Marlyne Beards, MD;  Location: [facility];  Service: Orthopedics;  Laterality:  Right;   CARPAL TUNNEL RELEASE Left 08/30/2021   Procedure: LEFT CARPAL TUNNEL RELEASE;  Surgeon: Marlyne Beards, MD;  Location: [facility];  Service: Orthopedics;  Laterality: Left;   CESAREAN SECTION  2019   CESAREAN SECTION  2007   CESAREAN SECTION  2014   CESAREAN SECTION  2016   ENDOMETRIAL BIOPSY  2011   GASTROSCHISIS CLOSURE     LEEP     x 2   VENTRAL HERNIA REPAIR N/A 12/11/2019   Procedure: HERNIA REPAIR VENTRAL ADULT;  Surgeon: Carolan Shiver, MD;  Location: [facility];  Service: General;  Laterality: N/A;   WISDOM TOOTH EXTRACTION     XI ROBOTIC ASSISTED VENTRAL HERNIA N/A 12/11/2019   Procedure: XI ROBOTIC ASSISTED VENTRAL HERNIA Lap vs open;  Surgeon: Carolan Shiver, MD;  Location: [facility];  Service: General;  Laterality: N/A;   Social History   Occupational History   Occupation: Curator: [employer]  Tobacco Use   Smoking status: Former    Packs/day: 0.30    Years: 3.00    Pack years: 0.90    Types: Cigarettes    Quit date: 2013    Years since quitting: 10.4    Passive exposure: Past   Smokeless tobacco: Never  Vaping Use   Vaping Use: Never used  Substance and Sexual Activity   Alcohol use: Yes    Alcohol/week: 2.0 standard drinks    Types: 2 Cans of beer per week   Drug use: Never   Sexual activity: Yes    Birth control/protection: None

## 2021-09-15 ENCOUNTER — Encounter: Payer: 59 | Admitting: Orthopedic Surgery

## 2021-09-15 ENCOUNTER — Ambulatory Visit (INDEPENDENT_AMBULATORY_CARE_PROVIDER_SITE_OTHER): Payer: 59 | Admitting: Psychologist

## 2021-09-15 DIAGNOSIS — F331 Major depressive disorder, recurrent, moderate: Secondary | ICD-10-CM | POA: Diagnosis not present

## 2021-09-15 DIAGNOSIS — F4310 Post-traumatic stress disorder, unspecified: Secondary | ICD-10-CM | POA: Diagnosis not present

## 2021-09-15 DIAGNOSIS — Z634 Disappearance and death of family member: Secondary | ICD-10-CM

## 2021-09-15 NOTE — Progress Notes (Signed)
[facility] Counselor/Therapist Progress Note  Patient ID: Crystal Hammond, MRN: [id number],    Date: 09/15/2021  Time Spent: 10:03 am to 10:42 am; total time: 39 minutes   This session was held via in person. The patient consented to in-person therapy and was in the clinician's office. Limits of confidentiality were discussed with the patient.   Treatment Type: Individual Therapy  Reported Symptoms: Grief  Mental Status Exam: Appearance:  Well Groomed     Behavior: Appropriate  Motor: Normal  Speech/Language:  Clear and Coherent  Affect: Appropriate  Mood: normal  Thought process: normal  Thought content:   WNL  Sensory/Perceptual disturbances:   WNL  Orientation: oriented to person, place, and time/date  Attention: Good  Concentration: Good  Memory: WNL  Fund of knowledge:  Good  Insight:   Fair  Judgment:  Fair  Impulse Control: Good   Risk Assessment: Danger to Self:  No Self-injurious Behavior: No Danger to Others: No Duty to Warn:no Physical Aggression / Violence:No  Access to Firearms a concern: No  Gang Involvement:No   Subjective: Beginning the session, patient described herself as okay. After reviewing the treatment plan, patient stated that she wanted to focus on the grief surrounding the death of her brother Vincenza Hews). As part of this conversation, patient spent time reflecting on different "rules" she learned from her family of origin related to grieving and processing emotions. She related on these themes in the session. She also identified that writing out what she is experiencing internally helps her. She was agreeable to homework and following up. She denied suicidal and homicidal ideation.    Interventions:  Worked on developing a therapeutic relationship with the patient using active listening and reflective statements. Provided emotional support using empathy and validation. Reviewed the treatment plan with the patient. Reviewed events since  the intake. Identified goals for the session. Normalized and validated expressed thoughts and emotions. Provided psychoeducation about the grief process. Challenged some of the patient's perceptions related to grief. Explored different family "rules" and expectations related to grief. Used socratic questions to assist the patient gain insight. Challenged some of the "rules" expressed. Processed thoughts and emotions. Used solution focused to assist the patient. Processed the idea of externalizing what she is experiencing internally. Assisted in problem solving. Provided empathic statements. Assigned homework. Assessed for suicidal and homicidal ideation.   Homework: Begin to journal what she is experiencing internally  Next Session: Review homework, emotional support, and coping skills.   Diagnosis: F43.10 posttraumatic stress disorder, F33.1 major depressive affective disorder, recurrent, moderate and Z63.4 uncomplicated bereavement.   Plan:   Goals Reduce overall frequency, intensity, and duration of trauma related symptoms Stabilize  trauma while increasing ability to function Enhance ability to effectively cope with triggers, reminders, flashbacks, moods, and cognitions of trauma Learn and implement coping skills that result in a reduction of trauma related symptoms Begin a healthy grieving process Alleviate depressive symptoms Recognize, accept, and cope with depressive feelings Develop healthy thinking patterns Develop healthy interpersonal relationships  Objectives target date for all objectives is 09/06/2022 Verbalize an understanding of the cognitive, physiological, and behavioral components of post traumatic stress disorder Describe in detail reminders of the triggers related to trauma  Learn and implement strategies to manage thoughts, behaviors, and feelings of trauma Learning and implement calming skills to reduce overall trauma Verbalize an understanding of the role that  cognitive biases play in excessive irrational worry and persistent trauma symptoms Learn and implement personal skills to manage  challenging situations Learn and implement problem solving strategies Identify and engage in pleasant activities Learn to accept limitations in life and commit to tolerating, rather than avoiding, unpleasant emotions while accomplishing meaningful goals Maintain involvement in work, family, and social activities Reestablish a consistent sleep-wake cycle Cooperate with a medical evaluation  Tell in detail the story of the current loss that is triggering symptoms Read books on the topic of grief Watch videos on the theme of grief Begin verbalizing feelings associated with the loss Attend a grief support group express thoughts and feelings about the deceased Identify and voice positives about the deceased implement acts of spiritual faith  Cooperate with a medication evaluation by a physician Verbalize an accurate understanding of depression Verbalize an understanding of the treatment Identify and replace thoughts that support depression Learn and implement behavioral strategies Verbalize an understanding and resolution of current interpersonal problems Learn and implement problem solving and decision making skills Learn and implement conflict resolution skills to resolve interpersonal problems Verbalize an understanding of healthy and unhealthy emotions verbalize insight into how past relationships may be influence current experiences with depression Use mindfulness and acceptance strategies and increase value based behavior  Increase hopeful statements about the future.  Interventions Engage the patient in behavioral activation Use instruction, modeling, and role-playing to build the client's general social, communication, and/or conflict resolution skills Use Acceptance and Commitment Therapy to help client accept uncomfortable realities in order to accomplish  value-consistent goals Support the client in following through with work, family, and social activities Teach and implement sleep hygiene practices  Refer the patient to a physician for a psychotropic medication consultation Monito the clint's psychotropic medication compliance Discuss how trauma typically involves excessive worry, various bodily expressions of tension, and avoidance of what is threatening that interact to maintain the problem  Teach the patient relaxation skills Assign the patient homework Discuss examples demonstrating that unrealistic worry overestimates the probability of threats and underestimates patient's ability  Assist the patient in analyzing his or her worries Help patient understand that avoidance is reinforcing  create a safe environment and actively build trust use empathy, compassion, and support ask the patient to write a letter to the lost person conduct empty chair ask the patient to discuss and list the positives and negative aspects of the person encourage patient to rely upon his/her spiritual faith  ask client to read books on grief ask patient to watch videos about grief assist patient in identifying emotions  ask patient to attend support group  Consistent with treatment model, discuss how change in cognitive, behavioral, and interpersonal can help client alleviate depression CBT Behavioral activation help the client explore the relationship, nature of the dispute,  Help the client develop new interpersonal skills and relationships Conduct Problem so living therapy Teach conflict resolution skills Use a process-experiential approach Conduct TLDP Conduct ACT Evaluate need for psychotropic medication Monitor adherence to medication   The patient and clinician reviewed the treatment plan on 09/15/2021. The patient approved of the treatment plan.    Hilbert Corrigan, PsyD

## 2021-09-15 NOTE — Progress Notes (Signed)
                Deola Rewis, PsyD 

## 2021-09-17 ENCOUNTER — Other Ambulatory Visit: Payer: Self-pay | Admitting: Family Medicine

## 2021-09-17 ENCOUNTER — Other Ambulatory Visit: Payer: Self-pay

## 2021-09-18 ENCOUNTER — Other Ambulatory Visit: Payer: Self-pay

## 2021-09-18 NOTE — Telephone Encounter (Signed)
Refill request for amphetamine-dextroamphetamine (ADDERALL XR) 30 MG 24 hr capsule  LOV - 07/07/21 Next OV - not scheduled Last refill - 08/22/21 #30/0

## 2021-09-19 ENCOUNTER — Other Ambulatory Visit: Payer: Self-pay

## 2021-09-19 ENCOUNTER — Ambulatory Visit (INDEPENDENT_AMBULATORY_CARE_PROVIDER_SITE_OTHER): Payer: 59 | Admitting: Psychologist

## 2021-09-19 ENCOUNTER — Ambulatory Visit (INDEPENDENT_AMBULATORY_CARE_PROVIDER_SITE_OTHER): Payer: 59 | Admitting: Orthopedic Surgery

## 2021-09-19 DIAGNOSIS — Z634 Disappearance and death of family member: Secondary | ICD-10-CM | POA: Diagnosis not present

## 2021-09-19 DIAGNOSIS — F331 Major depressive disorder, recurrent, moderate: Secondary | ICD-10-CM

## 2021-09-19 DIAGNOSIS — G5602 Carpal tunnel syndrome, left upper limb: Secondary | ICD-10-CM

## 2021-09-19 DIAGNOSIS — F4310 Post-traumatic stress disorder, unspecified: Secondary | ICD-10-CM

## 2021-09-19 MED ORDER — AMPHETAMINE-DEXTROAMPHET ER 30 MG PO CP24
ORAL_CAPSULE | ORAL | 0 refills | Status: DC
  Filled 2021-09-19 – 2021-09-20 (×2): qty 30, 30d supply, fill #0

## 2021-09-19 NOTE — Progress Notes (Signed)
[facility] Counselor/Therapist Progress Note  Patient ID: Crystal Hammond, MRN: [id number],    Date: 09/19/2021  Time Spent: 8:04 am to 8:44 am; total time: 40 minutes   This session was held via in person. The patient consented to in-person therapy and was in the clinician's office. Limits of confidentiality were discussed with the patient.  Treatment Type: Individual Therapy  Reported Symptoms: Interpersonal conflict with family  Mental Status Exam: Appearance:  Well Groomed     Behavior: Appropriate  Motor: Normal  Speech/Language:  Clear and Coherent  Affect: Appropriate  Mood: normal  Thought process: normal  Thought content:   WNL  Sensory/Perceptual disturbances:   WNL  Orientation: oriented to person, place, and time/date  Attention: Good  Concentration: Good  Memory: WNL  Fund of knowledge:  Good  Insight:   Fair  Judgment:  Fair  Impulse Control: Good   Risk Assessment: Danger to Self:  No Self-injurious Behavior: No Danger to Others: No Duty to Warn:no Physical Aggression / Violence:No  Access to Firearms a concern: No  Gang Involvement:No   Subjective: Beginning the session, patient described herself as having experienced a busy Saturday due to driving. From there, she voiced that journaling has assisted her. Continuing to talk, she initiated the conversation by talking about work and unsure how to feel about her new position. Elaborating, she voiced that a reason she took the position was the recognition for being needed. From there, she talked about challenges with her spouse at home and how she does not feel seen there. She talked about how she feels responsible for meeting all of his needs. Continuing the same point she talked about similar challenges with her mother. Patient recognized that it is hard for her to say no to others and reflected on this theme. She was open to feedback about CMPs. She was agreeable to homework and following up.  She denied suicidal and homicidal ideation.    Interventions:  Worked on developing a therapeutic relationship with the patient using active listening and reflective statements. Provided emotional support using empathy and validation. Reviewed events that occurred over the weekend. Normalized and validated expressed thoughts and emotions. Praised the patient for implementing the homework assignment and seeing success so quickly. Processed the concerns patient expressed about returning to work. Used socratic questions to assist the patient gain insight into self. Challenged some of the thoughts that were expressed. Explored the interpersonal dynamic that patient maintains with spouse. Identified the lack of emotional support from spouse. Identified themes of extrinsic and intrinsic validation. Processed what bring intrinsic validation to the patient. Processed dynamic with patient's mother. Processed thoughts and emotions. Identified challenges with saying no. Provided psychoeducation about podcast episode on saying no. Provided psychoeducation about CMPs (cyclical maladaptive patterns) with the patient. Assisted in problem solving. Discussed next steps. Provided empathic statements. Assigned homework. Assessed for suicidal and homicidal ideation.   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   Plan:   Goals Reduce overall frequency, intensity, and duration of trauma related symptoms Stabilize  trauma while increasing ability to function Enhance ability to effectively cope with triggers, reminders, flashbacks, moods, and cognitions of trauma Learn and implement coping skills that result in a reduction of trauma related symptoms Begin a healthy grieving process Alleviate depressive symptoms Recognize, accept, and cope  with depressive feelings Develop healthy thinking patterns  Develop healthy interpersonal relationships  Objectives target date for all objectives is 09/06/2022 Verbalize an understanding of the cognitive, physiological, and behavioral components of post traumatic stress disorder Describe in detail reminders of the triggers related to trauma  Learn and implement strategies to manage thoughts, behaviors, and feelings of trauma Learning and implement calming skills to reduce overall trauma Verbalize an understanding of the role that cognitive biases play in excessive irrational worry and persistent trauma symptoms Learn and implement personal skills to manage challenging situations Learn and implement problem solving strategies Identify and engage in pleasant activities Learn to accept limitations in life and commit to tolerating, rather than avoiding, unpleasant emotions while accomplishing meaningful goals Maintain involvement in work, family, and social activities Reestablish a consistent sleep-wake cycle Cooperate with a medical evaluation  Tell in detail the story of the current loss that is triggering symptoms Read books on the topic of grief Watch videos on the theme of grief Begin verbalizing feelings associated with the loss Attend a grief support group express thoughts and feelings about the deceased Identify and voice positives about the deceased implement acts of spiritual faith  Cooperate with a medication evaluation by a physician Verbalize an accurate understanding of depression Verbalize an understanding of the treatment Identify and replace thoughts that support depression Learn and implement behavioral strategies Verbalize an understanding and resolution of current interpersonal problems Learn and implement problem solving and decision making skills Learn and implement conflict resolution skills to resolve interpersonal problems Verbalize an understanding of healthy and  unhealthy emotions verbalize insight into how past relationships may be influence current experiences with depression Use mindfulness and acceptance strategies and increase value based behavior  Increase hopeful statements about the future.  Interventions Engage the patient in behavioral activation Use instruction, modeling, and role-playing to build the client's general social, communication, and/or conflict resolution skills Use Acceptance and Commitment Therapy to help client accept uncomfortable realities in order to accomplish value-consistent goals Support the client in following through with work, family, and social activities Teach and implement sleep hygiene practices  Refer the patient to a physician for a psychotropic medication consultation Monito the clint's psychotropic medication compliance Discuss how trauma typically involves excessive worry, various bodily expressions of tension, and avoidance of what is threatening that interact to maintain the problem  Teach the patient relaxation skills Assign the patient homework Discuss examples demonstrating that unrealistic worry overestimates the probability of threats and underestimates patient's ability  Assist the patient in analyzing his or her worries Help patient understand that avoidance is reinforcing  create a safe environment and actively build trust use empathy, compassion, and support ask the patient to write a letter to the lost person conduct empty chair ask the patient to discuss and list the positives and negative aspects of the person encourage patient to rely upon his/her spiritual faith  ask client to read books on grief ask patient to watch videos about grief assist patient in identifying emotions  ask patient to attend support group  Consistent with treatment model, discuss how change in cognitive, behavioral, and interpersonal can help client alleviate depression CBT Behavioral activation help the client  explore the relationship, nature of the dispute,  Help the client develop new interpersonal skills and relationships Conduct Problem so living therapy Teach conflict resolution skills Use a process-experiential approach Conduct TLDP Conduct ACT Evaluate need for psychotropic medication Monitor adherence to medication   The patient and clinician reviewed the treatment plan on 09/15/2021. The patient approved of  the treatment plan.    Hilbert CorriganMatthew Carless Slatten, PsyD

## 2021-09-19 NOTE — Progress Notes (Signed)
Post-Op Visit Note   Patient: Crystal Hammond           Date of Birth: August 08, 1980           MRN: [id number] Visit Date: 09/19/2021 PCP: [name], MD   Assessment & Plan:  Chief Complaint:  Chief Complaint  Patient presents with   Left Hand - Follow-up    Hand still hurts, numbness is better, having tightness   Visit Diagnoses:  1. Carpal tunnel syndrome of left wrist     Plan: Patient is approximately 3-week status post left carpal tunnel release.  She previously had a little bit of a stitch abscess which has resolved with wound care and oral antibiotic.  She has no additional numbness or paresthesias.  She is no longer dropping items.  Her nocturnal symptoms have resolved.  She still describes "tightness" in the palm around the incision.  She is able make a full complete fist with a strong grip.  She has 5-5 thenar motor strength.  I will have her work with our hand therapist to work on stretching, strengthening, and scar desensitization.  Follow-Up Instructions: No follow-ups on file.   Orders:  No orders of the defined types were placed in this encounter.  No orders of the defined types were placed in this encounter.   Imaging: No results found.  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   Itching 07/22/2020   Vitamin D deficiency 01/26/2020   Incisional hernia 12/11/2019   Prediabetes 07/26/2019   GAD (generalized anxiety disorder) 06/25/2019   Adult ADHD 06/25/2019    Depressive disorder 06/25/2019   Family history of congenital anomalies 05/27/2017   Herpes 05/27/2017   Hx of cesarean section 05/27/2017   Migraine with aura 05/27/2017   Agoraphobia 05/30/2016   History of abnormal cervical Pap smear 01/31/2016   History of kidney stones 01/31/2016   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)   Heart murmur    OUTGREW   History of febrile seizure    History of kidney stones    History of pneumonia    Migraines    OCD (obsessive compulsive disorder)    Oral herpes    Pneumonia    PONV (postoperative nausea and vomiting)    Postpartum depression    Pre-diabetes    Psoriasis    PTSD (post-traumatic stress disorder)    Recurrent UTI    Rosacea    Sleep apnea     Family History  Problem Relation Age of Onset   Depression Mother    Alcohol abuse Mother    Hernia Mother    Rashes / Skin problems Mother    Thyroid disease Mother    Anxiety disorder Mother    Depression Father    Alcohol abuse Father    Gout Father  Diabetes Father    Asthma Brother    Bipolar disorder Brother    Drug abuse Brother    ADD / ADHD Brother    Breast cancer Maternal Aunt    Lung cancer Maternal Aunt    Colon cancer Paternal Grandmother    Allergic rhinitis Son    Asthma Son    ADD / ADHD Son    Allergic rhinitis Son    ADD / ADHD Son    Ovarian cancer Neg Hx     Past Surgical History:  Procedure Laterality Date   ABDOMINAL SURGERY  2012   Endometrial Surgery - went through c-section scar   CARPAL TUNNEL RELEASE Right 08/07/2021   Procedure: RIGHT CARPAL TUNNEL RELEASE;  Surgeon: Sherilyn Cooter, MD;  Location: [facility];  Service: Orthopedics;  Laterality: Right;   CARPAL TUNNEL RELEASE Left 08/30/2021   Procedure: LEFT CARPAL TUNNEL RELEASE;  Surgeon: Sherilyn Cooter, MD;  Location:  [facility];  Service: Orthopedics;  Laterality: Left;   CESAREAN SECTION  2019   CESAREAN SECTION  2007   CESAREAN SECTION  2014   CESAREAN SECTION  2016   ENDOMETRIAL BIOPSY  2011   GASTROSCHISIS CLOSURE     LEEP     x 2   VENTRAL HERNIA REPAIR N/A 12/11/2019   Procedure: HERNIA REPAIR VENTRAL ADULT;  Surgeon: Herbert Pun, MD;  Location: [facility];  Service: General;  Laterality: N/A;   WISDOM TOOTH EXTRACTION     XI ROBOTIC ASSISTED VENTRAL HERNIA N/A 12/11/2019   Procedure: XI ROBOTIC ASSISTED VENTRAL HERNIA Lap vs open;  Surgeon: Herbert Pun, MD;  Location: [facility];  Service: General;  Laterality: N/A;   Social History   Occupational History   Occupation: Research scientist (life sciences): [employer]  Tobacco Use   Smoking status: Former    Packs/day: 0.30    Years: 3.00    Pack years: 0.90    Types: Cigarettes    Quit date: 2013    Years since quitting: 10.4    Passive exposure: Past   Smokeless tobacco: Never  Vaping Use   Vaping Use: Never used  Substance and Sexual Activity   Alcohol use: Yes    Alcohol/week: 2.0 standard drinks    Types: 2 Cans of beer per week   Drug use: Never   Sexual activity: Yes    Birth control/protection: None

## 2021-09-19 NOTE — Telephone Encounter (Signed)
Sent. Thanks.   

## 2021-09-20 ENCOUNTER — Other Ambulatory Visit: Payer: Self-pay

## 2021-09-20 ENCOUNTER — Encounter: Payer: Self-pay | Admitting: Rehabilitative and Restorative Service Providers"

## 2021-09-20 ENCOUNTER — Ambulatory Visit: Payer: 59 | Admitting: Rehabilitative and Restorative Service Providers"

## 2021-09-20 DIAGNOSIS — M6281 Muscle weakness (generalized): Secondary | ICD-10-CM

## 2021-09-20 DIAGNOSIS — M25632 Stiffness of left wrist, not elsewhere classified: Secondary | ICD-10-CM

## 2021-09-20 DIAGNOSIS — M25631 Stiffness of right wrist, not elsewhere classified: Secondary | ICD-10-CM

## 2021-09-20 DIAGNOSIS — R600 Localized edema: Secondary | ICD-10-CM

## 2021-09-20 DIAGNOSIS — M25532 Pain in left wrist: Secondary | ICD-10-CM | POA: Diagnosis not present

## 2021-09-20 NOTE — Therapy (Signed)
OUTPATIENT OCCUPATIONAL THERAPY ORTHO EVALUATION  Patient Name: Crystal Hammond MRN: [id number] DOB:March 31, 1981, 41 y.o., female Today's Date: 09/20/2021  PCP: Dr. Elsie Stain REFERRING PROVIDER: Dr. Sherilyn Cooter    OT End of Session - 09/20/21 1306     Visit Number 1    Number of Visits 10    Date for OT Re-Evaluation 11/03/21    Authorization Type [facility]    OT Start Time 1306    OT Stop Time 1426    OT Time Calculation (min) 80 min    Activity Tolerance Patient tolerated treatment well;No increased pain;Patient limited by fatigue;Patient limited by pain    Behavior During Therapy Anxious             Past Medical History:  Diagnosis Date   Anxiety    Cervical dysplasia    Complication of anesthesia    LOW BLOOD PRESSURE   Depression    Endometriosis    Gastroschisis [birth year] (birth)   Heart murmur    OUTGREW   History of febrile seizure    History of kidney stones    History of pneumonia    Migraines    OCD (obsessive compulsive disorder)    Oral herpes    Pneumonia    PONV (postoperative nausea and vomiting)    Postpartum depression    Pre-diabetes    Psoriasis    PTSD (post-traumatic stress disorder)    Recurrent UTI    Rosacea    Sleep apnea    Past Surgical History:  Procedure Laterality Date   ABDOMINAL SURGERY  2012   Endometrial Surgery - went through c-section scar   CARPAL TUNNEL RELEASE Right 08/07/2021   Procedure: RIGHT CARPAL TUNNEL RELEASE;  Surgeon: Sherilyn Cooter, MD;  Location: [place];  Service: Orthopedics;  Laterality: Right;   CARPAL TUNNEL RELEASE Left 08/30/2021   Procedure: LEFT CARPAL TUNNEL RELEASE;  Surgeon: Sherilyn Cooter, MD;  Location: [place];  Service: Orthopedics;  Laterality: Left;   CESAREAN SECTION  2019   CESAREAN SECTION  2007   CESAREAN SECTION  2014   CESAREAN SECTION  2016   ENDOMETRIAL BIOPSY  2011   GASTROSCHISIS CLOSURE     LEEP     x 2   VENTRAL HERNIA REPAIR  N/A 12/11/2019   Procedure: HERNIA REPAIR VENTRAL ADULT;  Surgeon: Herbert Pun, MD;  Location: [facility];  Service: General;  Laterality: N/A;   WISDOM TOOTH EXTRACTION     XI ROBOTIC ASSISTED VENTRAL HERNIA N/A 12/11/2019   Procedure: XI ROBOTIC ASSISTED VENTRAL HERNIA Lap vs open;  Surgeon: Herbert Pun, MD;  Location: [facility];  Service: General;  Laterality: N/A;   Patient Active Problem List   Diagnosis Date Noted   Carpal tunnel syndrome, left upper limb    Carpal tunnel syndrome, right upper limb    Bilateral carpal tunnel syndrome 07/31/2021   Advance care planning 07/09/2021   Carpal tunnel syndrome 07/09/2021   OSA (obstructive sleep apnea) 07/09/2021   SOBOE (shortness of breath on exertion) 03/19/2021   Tachycardia 03/19/2021   Abdominal pain 02/21/2021   Routine general medical examination at a health care facility 01/15/2021   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   Itching 07/22/2020   Vitamin D deficiency 01/26/2020   Incisional hernia 12/11/2019   Prediabetes 07/26/2019   GAD (generalized anxiety disorder) 06/25/2019   Adult ADHD 06/25/2019   Depressive disorder 06/25/2019   Family history of congenital anomalies 05/27/2017   Herpes 05/27/2017  Hx of cesarean section 05/27/2017   Migraine with aura 05/27/2017   Agoraphobia 05/30/2016   History of abnormal cervical Pap smear 01/31/2016   History of kidney stones 01/31/2016    ONSET DATE: Lt DOS: 08/30/21, Rt DOS: 08/07/21  REFERRING DIAG: G56.02 (ICD-10-CM) - Carpal tunnel syndrome of left wrist  THERAPY DIAG:  Pain in left wrist  Localized edema  Muscle weakness (generalized)  Stiffness of left wrist, not elsewhere classified  Stiffness of right wrist, not elsewhere classified  Rationale for Evaluation and Treatment Rehabilitation  SUBJECTIVE:   SUBJECTIVE STATEMENT: She is a CNA, 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 She  states that left CTR feels tight through hand/palm and she is weak, has difficulty lifting milk out of the fridge, picking things up, fine motor skills. She does state b/l hands have no numbness. Left palm has some signs of infection while sutures were in, but right hand did not have that occurrence. She states her husband "really presses on my scars to get them moving- he digs in there." She has been using stress balls to roll over scars and squeeze, etc.    PERTINENT HISTORY: Per MD: "Bilateral carpal tunnel releases.  Describes "tightness" in palm.  Would like to work on stretching, strengthening to get her ready to return to work."  PRECAUTIONS: 3 weeks s/p Lt CTR, 6 weeks s/p Rt CTR; A/PROM ok now, wait until 4-6 weeks for light hand strength; wait 3-4 months for full,unrestricted use  WEIGHT BEARING RESTRICTIONS No lifting more than 5# in left hand for 2 more weeks   PAIN:  Are you having pain? Not at rest but shows symptoms of hypersensitivity to light touch  Rating: 0/10 now at rest, but she flinches back in pain when OT touches her scar   FALLS: Has patient fallen in last 6 months? No  LIVING ENVIRONMENT: Lives with: lives with their family and lives with their spouse   PLOF: Independent  PATIENT GOALS: To full use of hand back, to not have numbness/hypersensitivity.    OBJECTIVE:   HAND DOMINANCE: Right   ADLs: Overall ADLs: States decreased ability to grab, hold household objects, pain and inability to pen containers, perform all FMS tasks, etc.    FUNCTIONAL OUTCOME MEASURES: Eval: Patient Specific Functional Scale: 3 (open jars, lifting milk carton, pull up her child's pants)  UPPER EXTREMITY ROM     Eval: right hand opposition to SF 71m gap; 1cm gap in left hand opposition   Active ROM Right eval Left eval  Wrist flexion 47 19  Wrist extension 60 51  (Blank rows = not tested)   UPPER EXTREMITY MMT:     MMT Right eval Left eval  Wrist flexion 4-/5 pain  4+/5  Wrist extension 5/5 3+/5 pain  (Blank rows = not tested)  HAND FUNCTION: Eval: Grip strength Right: 43 lbs, Left: TBD lbs (left not tolerated today)   COORDINATION: Eval: 9 Hole Peg Test Right: 22.3sec, Left: 21.9 sec  SENSATION: Eval: 473mS2PD in right IF, 44m20m2PD in left IF; light touch intact today, though she feels diminished in left hand  EDEMA:   Eval: Mildly swollen in left hand and wrist today compared to right hand   COGNITION: Overall cognitive status: WFL for evaluation today, though she was anxious at times  OBSERVATIONS:   Eval: She has overt hypersensitivity (flinching and guarding in pain) in left sx area to light touch  and c/o allodynia. Slightly red and swollen there as well. Rt only mildly TTP now. Some mild scar adhesions in right CTR noted with wrist and finger motion and some in left CTR as well.    TODAY'S TREATMENT:  Eval: OT is educating on self-care and recommending night bracing for 2 weeks to limit bending of left wrist, recommending no lifting > 5 # for 2 weeks at least in left hand and discusses possible modifications to doing these tasks at home.  OT provided her with theraputty and a home exercise/activity plan for nerve symptoms, b/l wrist tightness, right hand strength and left and AROM (given the different stages of healing), as below. Each exercise is gone over in abbreviated manner today, with her stating understanding. (This will need more review and upgrades.)  OT also does manual scar desensitization to Lt wrist, mobilization to right wrist and k-taping to right sx area to help mobilize scar there.   Exercises - Bend and Pull Back Wrist SLOWLY  - 3-4 x daily - 1-2 sets - 10-15 reps - "Windshield Wipers"   - 3-4 x daily - 1-2 sets - 10-15 reps - Touch Thumb to [facility] Finger  - 3-4 x daily - 1-2 sets - 10 reps - Tendon Glides  - 3-4 x daily - 3-5 reps - 2-3 seconds hold - Seated Wrist Flexion Stretch  - 2-3 x daily - 3 reps - 15 hold - Wrist  Extension Stretch Pronated  - 2-3 x daily - 3 reps - 15 hold - Full Fist  - 2-3 x daily - 5 reps - Seated Claw Fist with Putty  - 2-3 x daily - 5 reps - "Duck Mouth" Strength  - 2-3 x daily - 5 reps - Finger Extension "Pizza!"   - 2-3 x daily - 5 reps - Thumb Opposition with Putty  - 2-3 x daily - 5 reps - Seated Median Nerve Glide  - 3-4 x daily - 5 reps - Median Nerve Flossing  - 4-6 x daily - 1 sets - 10-15 reps   PATIENT EDUCATION: Education details: See tx section above for details  Person educated: Patient Education method: Verbal Instruction, Teach back, Handouts  Education comprehension: States and demonstrates understanding, Additional Education required    HOME EXERCISE PROGRAM: Access Code: [email or web address] URL: [email or web address] Prepared by: Benito Mccreedy   GOALS: Goals reviewed with patient? Yes   SHORT TERM GOALS: (STG required if POC>30 days)  Pt will obtain protective, custom orthotic. Target date: TBD as needed Goal status: MET  2.  Pt will demo/state understanding of initial HEP to improve pain levels and prerequisite motion. Target date: 09/29/21 Goal status: INITIAL   LONG TERM GOALS:  Pt will improve functional ability by decreased impairment per PSFS assessment from 3 to 8 or better, for better quality of life. Target date: 11/03/21 Goal status: INITIAL  2.  Pt will improve grip strength in b/l hands to at least 45lbs with no pain for functional use at home and in IADLs. Target date: 11/03/21 Goal status: INITIAL  3.  Pt will improve A/ROM in b/l wrist flexion to at least 60* each, to have functional motion for tasks like reach and grasp.  Target date: 11/03/21 Goal status: INITIAL  4.  Pt will improve strength in left wrist flexion from painful 3+/5 MMT to at least 4+/5 MMT to have increased functional ability to carry out selfcare and higher-level homecare tasks with no difficulty. Target date: 11/03/21 Goal status:  INITIAL  ASSESSMENT:  CLINICAL IMPRESSION: Patient is a 41 y.o. female who was seen today for occupational therapy evaluation for b/l hand weakness, stiffness of wrists and decreased functional ability. It seems like Rt CTR doing better than Lt, possibly due to her pushing the latter surgery too quickly and also having infection there, delayed healing, etc.   PERFORMANCE DEFICITS in functional skills including ADLs, IADLs, coordination, dexterity, edema, tone, ROM, strength, pain, fascial restrictions, flexibility, FMC, GMC, body mechanics, endurance, decreased knowledge of precautions, and UE functional use, cognitive skills including emotional and safety awareness, and psychosocial skills including coping strategies, environmental adaptation, habits, and routines and behaviors.   IMPAIRMENTS are limiting patient from ADLs, IADLs, rest and sleep, work, social participation, and child rearing .   COMORBIDITIES has co-morbidities such as anxiety and depression, high blood sugar, migraines, OSA, and others  that affects occupational performance. Patient will benefit from skilled OT to address above impairments and improve overall function.  MODIFICATION OR ASSISTANCE TO COMPLETE EVALUATION: Min-Moderate modification of tasks or assist with assess necessary to complete an evaluation.  OT OCCUPATIONAL PROFILE AND HISTORY: Detailed assessment: Review of records and additional review of physical, cognitive, psychosocial history related to current functional performance.  CLINICAL DECISION MAKING: Moderate - several treatment options, min-mod task modification necessary  REHAB POTENTIAL: Good  EVALUATION COMPLEXITY: Moderate      PLAN: OT FREQUENCY: 2x/week (tapering as medically indicated)   OT DURATION: 6 weeks (through 11/03/21)  PLANNED INTERVENTIONS: self care/ADL training, therapeutic exercise, therapeutic activity, neuromuscular re-education, manual therapy, scar mobilization,  passive range of motion, splinting, electrical stimulation, ultrasound, fluidotherapy, compression bandaging, moist heat, cryotherapy, contrast bath, patient/family education, coping strategies training, and DME and/or AE instructions  RECOMMENDED OTHER SERVICES: none now   CONSULTED AND AGREED WITH PLAN OF CARE: Patient  PLAN FOR NEXT SESSION:  Check initial HEP, perform with her, start manual techniques and modalities as indicated, upgrade as tolerated   Benito Mccreedy, OTR/L, CHT 09/20/2021, 3:05 PM

## 2021-09-22 ENCOUNTER — Encounter: Payer: 59 | Admitting: Rehabilitative and Restorative Service Providers"

## 2021-09-26 ENCOUNTER — Telehealth: Payer: Self-pay | Admitting: Orthopedic Surgery

## 2021-09-26 ENCOUNTER — Encounter: Payer: Self-pay | Admitting: Rehabilitative and Restorative Service Providers"

## 2021-09-26 ENCOUNTER — Encounter: Payer: Self-pay | Admitting: Radiology

## 2021-09-26 ENCOUNTER — Ambulatory Visit (INDEPENDENT_AMBULATORY_CARE_PROVIDER_SITE_OTHER): Payer: 59 | Admitting: Rehabilitative and Restorative Service Providers"

## 2021-09-26 DIAGNOSIS — M25631 Stiffness of right wrist, not elsewhere classified: Secondary | ICD-10-CM | POA: Diagnosis not present

## 2021-09-26 DIAGNOSIS — M25532 Pain in left wrist: Secondary | ICD-10-CM | POA: Diagnosis not present

## 2021-09-26 DIAGNOSIS — M25632 Stiffness of left wrist, not elsewhere classified: Secondary | ICD-10-CM | POA: Diagnosis not present

## 2021-09-26 DIAGNOSIS — M6281 Muscle weakness (generalized): Secondary | ICD-10-CM

## 2021-09-26 DIAGNOSIS — R600 Localized edema: Secondary | ICD-10-CM | POA: Diagnosis not present

## 2021-09-26 NOTE — Telephone Encounter (Signed)
Pt called requesting a work note with restrictions. Please call pt about this matter at [phone].

## 2021-09-26 NOTE — Therapy (Signed)
OUTPATIENT OCCUPATIONAL THERAPY TREATMENT NOTE   Patient Name: Crystal Hammond MRN: [id number] DOB:Jul 17, 1980, 41 y.o., female Today's Date: 09/26/2021  PCP: Dr. Elsie Stain REFERRING PROVIDER: Dr. Sherilyn Cooter   END OF SESSION:   OT End of Session - 09/26/21 1052     Visit Number 2    Number of Visits 10    Date for OT Re-Evaluation 11/03/21    Authorization Type [facility]    OT Start Time 1058    OT Stop Time 1150    OT Time Calculation (min) 52 min    Activity Tolerance Patient tolerated treatment well;No increased pain;Patient limited by fatigue;Patient limited by pain    Behavior During Therapy Anxious             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)   Heart murmur    OUTGREW   History of febrile seizure    History of kidney stones    History of pneumonia    Migraines    OCD (obsessive compulsive disorder)    Oral herpes    Pneumonia    PONV (postoperative nausea and vomiting)    Postpartum depression    Pre-diabetes    Psoriasis    PTSD (post-traumatic stress disorder)    Recurrent UTI    Rosacea    Sleep apnea    Past Surgical History:  Procedure Laterality Date   ABDOMINAL SURGERY  2012   Endometrial Surgery - went through c-section scar   CARPAL TUNNEL RELEASE Right 08/07/2021   Procedure: RIGHT CARPAL TUNNEL RELEASE;  Surgeon: Sherilyn Cooter, MD;  Location: [place];  Service: Orthopedics;  Laterality: Right;   CARPAL TUNNEL RELEASE Left 08/30/2021   Procedure: LEFT CARPAL TUNNEL RELEASE;  Surgeon: Sherilyn Cooter, MD;  Location: [place];  Service: Orthopedics;  Laterality: Left;   CESAREAN SECTION  2019   CESAREAN SECTION  2007   CESAREAN SECTION  2014   CESAREAN SECTION  2016   ENDOMETRIAL BIOPSY  2011   GASTROSCHISIS CLOSURE     LEEP     x 2    VENTRAL HERNIA REPAIR N/A 12/11/2019   Procedure: HERNIA REPAIR VENTRAL ADULT;  Surgeon: Herbert Pun, MD;  Location: [facility];  Service: General;  Laterality: N/A;   WISDOM TOOTH EXTRACTION     XI ROBOTIC ASSISTED VENTRAL HERNIA N/A 12/11/2019   Procedure: XI ROBOTIC ASSISTED VENTRAL HERNIA Lap vs open;  Surgeon: Herbert Pun, MD;  Location: [facility];  Service: General;  Laterality: N/A;   Patient Active Problem List   Diagnosis Date Noted   Carpal tunnel syndrome, left upper limb    Carpal tunnel syndrome, right upper limb    Bilateral carpal tunnel syndrome 07/31/2021   Advance care planning 07/09/2021   Carpal tunnel syndrome 07/09/2021   OSA (obstructive sleep apnea) 07/09/2021   SOBOE (shortness of breath on exertion) 03/19/2021   Tachycardia 03/19/2021   Abdominal pain 02/21/2021   Routine general medical examination at a health care facility 01/15/2021   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   Itching 07/22/2020   Vitamin D deficiency 01/26/2020   Incisional hernia 12/11/2019   Prediabetes 07/26/2019   GAD (generalized anxiety disorder) 06/25/2019   Adult ADHD 06/25/2019   Depressive disorder 06/25/2019   Family history of congenital anomalies 05/27/2017  Herpes 05/27/2017   Hx of cesarean section 05/27/2017   Migraine with aura 05/27/2017   Agoraphobia 05/30/2016   History of abnormal cervical Pap smear 01/31/2016   History of kidney stones 01/31/2016    ONSET DATE: Lt DOS: 08/30/21, Rt DOS: 08/07/21   REFERRING DIAG: G56.02 (ICD-10-CM) - Carpal tunnel syndrome of left wrist  THERAPY DIAG:  Pain in left wrist  Muscle weakness (generalized)  Stiffness of right wrist, not elsewhere classified  Stiffness of left wrist, not elsewhere classified  Localized edema  Rationale for Evaluation and Treatment Rehabilitation  PERTINENT HISTORY: Per MD: "Bilateral carpal tunnel releases.  Describes "tightness" in palm.  Would like to work on stretching,  strengthening to get her ready to return to work."   PRECAUTIONS: 4 weeks s/p Lt CTR, 6 weeks s/p Rt CTR; A/PROM ok now, wait until 4-6 weeks for light hand strength; wait 3-4 months for full,unrestricted use   WEIGHT BEARING RESTRICTIONS No lifting more than 5# in left hand for 1 more week    SUBJECTIVE:  She states wearing brace at night as asked and it's been much better at night now.   PAIN:  Are you having pain? Yes- burning, soreness  Rating: 5/10 at rest now    OBJECTIVE: (All objective assessments below are from initial evaluation on: 09/20/21 unless otherwise specified.)   HAND DOMINANCE: Right    ADLs: Overall ADLs: States decreased ability to grab, hold household objects, pain and inability to pen containers, perform all FMS tasks, etc.      FUNCTIONAL OUTCOME MEASURES: Eval: Patient Specific Functional Scale: 3 (open jars, lifting milk carton, pull up her child's pants)   UPPER EXTREMITY ROM                Eval: right hand opposition to SF 18mm gap; 1cm gap in left hand opposition    Active ROM Right eval Left eval Left  09/26/21  Wrist flexion 47 19 62  Wrist extension 60 51 47  (Blank rows = not tested)     UPPER EXTREMITY MMT:      MMT Right eval Left eval  Wrist flexion 4-/5 pain 4+/5  Wrist extension 5/5 3+/5 pain  (Blank rows = not tested)   HAND FUNCTION: 09/26/21: Grip strength Left:  17.8 lbs, Left Palmar Pinch: 4 lbs  (10# right)    Eval: Grip strength Right: 43 lbs, Left: TBD lbs (left not tolerated today)    COORDINATION: Eval: 9 Hole Peg Test Right: 22.3sec, Left: 21.9 sec   SENSATION: Eval: 33mm S2PD in right IF, 81mm S2PD in left IF; light touch intact today, though she feels diminished in left hand   EDEMA:             Eval: Mildly swollen in left hand and wrist today compared to right hand    COGNITION: Overall cognitive status: WFL for evaluation today, though she was anxious at times   OBSERVATIONS:             Eval: She has  overt hypersensitivity (flinching and guarding in pain) in left sx area to light touch and c/o allodynia. Slightly red and swollen there as well. Rt only mildly TTP now. Some mild scar adhesions in right CTR noted with wrist and finger motion and some in left CTR as well.      TODAY'S TREATMENT:  09/26/21: OT again educates on systematic desensitization and today she uses a vibration tool to help with this over left hand scar  for 2 minutes (pain gate theory). She tolerates light touch better today as well as gripping and pinching with left hand, but she likely doesn't have the tolerance or ability to return to work safely in this condition yet. (This is something she's concerned about, causing stress, tears, and likely will negatively impact her recovery.) OT also goes over HEP with her as below, avoiding putty today to focus more on pain relief and motion for now. She states understanding need for rest / avoidance of exacerbation is importance as well as periodic HEP use throughout her day. OT again applies K-tape to pull on scar adhesions around her carpal tunnel which she states was helpful after initial evaluation. Adhesions in her wrist can be seen today (blanching) when she performs tendon glides.   Exercises - Bend and Pull Back Wrist SLOWLY  - 3-4 x daily - 1-2 sets - 10-15 reps - "Windshield Wipers"   - 3-4 x daily - 1-2 sets - 10-15 reps - Touch Thumb to [facility] Finger  - 3-4 x daily - 1-2 sets - 10 reps - Tendon Glides  - 3-4 x daily - 3-5 reps - 2-3 seconds hold - Seated Wrist Flexion Stretch  - 2-3 x daily - 3 reps - 15 hold - Wrist Extension Stretch Pronated  - 2-3 x daily - 3 reps - 15 hold - Full Fist  - 2-3 x daily - 5 reps - Seated Claw Fist with Putty  - 2-3 x daily - 5 reps - "Duck Mouth" Strength  - 2-3 x daily - 5 reps - Finger Extension "Pizza!"   - 2-3 x daily - 5 reps - Thumb Opposition with Putty  - 2-3 x daily - 5 reps - Seated Median Nerve Glide  - 3-4 x daily - 5 reps -  Median Nerve Flossing  - 4-6 x daily - 1 sets - 10-15 reps     PATIENT EDUCATION: Education details: See tx section above for details  Person educated: Patient Education method: Verbal Instruction, Teach back, Handouts  Education comprehension: States and demonstrates understanding, Additional Education required      HOME EXERCISE PROGRAM: Access Code: [email or web address] URL: [email or web address] Prepared by: Benito Mccreedy     GOALS: Goals reviewed with patient? Yes     SHORT TERM GOALS: (STG required if POC>30 days)   Pt will obtain protective, custom orthotic. Target date: TBD as needed Goal status: MET   2.  Pt will demo/state understanding of initial HEP to improve pain levels and prerequisite motion. Target date: 09/29/21 Goal status: INITIAL     LONG TERM GOALS:   Pt will improve functional ability by decreased impairment per PSFS assessment from 3 to 8 or better, for better quality of life. Target date: 11/03/21 Goal status: INITIAL   2.  Pt will improve grip strength in b/l hands to at least 45lbs with no pain for functional use at home and in IADLs. Target date: 11/03/21 Goal status: INITIAL   3.  Pt will improve A/ROM in b/l wrist flexion to at least 60* each, to have functional motion for tasks like reach and grasp.  Target date: 11/03/21 Goal status: INITIAL   4.  Pt will improve strength in left wrist flexion from painful 3+/5 MMT to at least 4+/5 MMT to have increased functional ability to carry out selfcare and higher-level homecare tasks with no difficulty. Target date: 11/03/21 Goal status: INITIAL     ASSESSMENT:   CLINICAL IMPRESSION: 09/26/21: She made significant motion  improvement today at wrist, tolerating LT better, tolerating gripping better, but due to high anxiety and continued hypersensitivity, OT thinks it would be detrimental for her to start pushing and pulling heavy weight or returning to full, unrestricted use of hand at work yet.  OT will speak to MD about his thoughts on this.   Eval: Patient is a 41 y.o. female who was seen today for occupational therapy evaluation for b/l hand weakness, stiffness of wrists and decreased functional ability. It seems like Rt CTR doing better than Lt, possibly due to her pushing the latter surgery too quickly and also having infection there, delayed healing, etc.   PLAN: OT FREQUENCY: 2x/week (tapering as medically indicated)    OT DURATION: 6 weeks (through 11/03/21)   PLANNED INTERVENTIONS: self care/ADL training, therapeutic exercise, therapeutic activity, neuromuscular re-education, manual therapy, scar mobilization, passive range of motion, splinting, electrical stimulation, ultrasound, fluidotherapy, compression bandaging, moist heat, cryotherapy, contrast bath, patient/family education, coping strategies training, and DME and/or AE instructions   RECOMMENDED OTHER SERVICES: none now    CONSULTED AND AGREED WITH PLAN OF CARE: Patient   PLAN FOR NEXT SESSION:  Check motion and progress toward goals, continue with trying to decrease pain and hypersensitivity while working on grip strength and fnl ability as well as she can tolerate.   Benito Mccreedy, OTR/L, CHT 09/26/2021, 12:04 PM

## 2021-09-26 NOTE — Telephone Encounter (Signed)
I called and advised that I wrote her note and I will place it downstairs for her to pick up

## 2021-09-28 ENCOUNTER — Encounter: Payer: Self-pay | Admitting: Rehabilitative and Restorative Service Providers"

## 2021-09-28 ENCOUNTER — Ambulatory Visit: Payer: 59 | Admitting: Rehabilitative and Restorative Service Providers"

## 2021-09-28 DIAGNOSIS — R600 Localized edema: Secondary | ICD-10-CM | POA: Diagnosis not present

## 2021-09-28 DIAGNOSIS — M25632 Stiffness of left wrist, not elsewhere classified: Secondary | ICD-10-CM | POA: Diagnosis not present

## 2021-09-28 DIAGNOSIS — M25631 Stiffness of right wrist, not elsewhere classified: Secondary | ICD-10-CM

## 2021-09-28 DIAGNOSIS — M25532 Pain in left wrist: Secondary | ICD-10-CM

## 2021-09-28 DIAGNOSIS — M6281 Muscle weakness (generalized): Secondary | ICD-10-CM

## 2021-09-28 NOTE — Therapy (Signed)
OUTPATIENT OCCUPATIONAL THERAPY TREATMENT NOTE   Patient Name: Crystal Hammond MRN: [id number] DOB:1980/12/18, 41 y.o., female Today's Date: 09/28/2021  PCP: Dr. Elsie Stain REFERRING PROVIDER: Dr. 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)   Heart murmur    OUTGREW   History of febrile seizure    History of kidney stones    History of pneumonia    Migraines    OCD (obsessive compulsive disorder)    Oral herpes    Pneumonia    PONV (postoperative nausea and vomiting)    Postpartum depression    Pre-diabetes    Psoriasis    PTSD (post-traumatic stress disorder)    Recurrent UTI    Rosacea    Sleep apnea    Past Surgical History:  Procedure Laterality Date   ABDOMINAL SURGERY  2012   Endometrial Surgery - went through c-section scar   CARPAL TUNNEL RELEASE Right 08/07/2021   Procedure: RIGHT CARPAL TUNNEL RELEASE;  Surgeon: Sherilyn Cooter, MD;  Location: [place];  Service: Orthopedics;  Laterality: Right;   CARPAL TUNNEL RELEASE Left 08/30/2021   Procedure: LEFT CARPAL TUNNEL RELEASE;  Surgeon: Sherilyn Cooter, MD;  Location: [place];  Service: Orthopedics;  Laterality: Left;   CESAREAN SECTION  2019   CESAREAN SECTION  2007   CESAREAN SECTION  2014   CESAREAN SECTION  2016   ENDOMETRIAL BIOPSY  2011   GASTROSCHISIS CLOSURE     LEEP     x 2    VENTRAL HERNIA REPAIR N/A 12/11/2019   Procedure: HERNIA REPAIR VENTRAL ADULT;  Surgeon: Herbert Pun, MD;  Location: [facility];  Service: General;  Laterality: N/A;   WISDOM TOOTH EXTRACTION     XI ROBOTIC ASSISTED VENTRAL HERNIA N/A 12/11/2019   Procedure: XI ROBOTIC ASSISTED VENTRAL HERNIA Lap vs open;  Surgeon: Herbert Pun, MD;  Location: [facility];  Service: General;  Laterality: N/A;   Patient Active Problem List   Diagnosis Date Noted   Carpal tunnel syndrome, left upper limb    Carpal tunnel syndrome, right upper limb    Bilateral carpal tunnel syndrome 07/31/2021   Advance care planning 07/09/2021   Carpal tunnel syndrome 07/09/2021   OSA (obstructive sleep apnea) 07/09/2021   SOBOE (shortness of breath on exertion) 03/19/2021   Tachycardia 03/19/2021   Abdominal pain 02/21/2021   Routine general medical examination at a health care facility 01/15/2021   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   Itching 07/22/2020   Vitamin D deficiency 01/26/2020   Incisional hernia 12/11/2019   Prediabetes 07/26/2019   GAD (generalized anxiety disorder) 06/25/2019   Adult ADHD 06/25/2019   Depressive disorder 06/25/2019   Family history of congenital anomalies  05/27/2017   Herpes 05/27/2017   Hx of cesarean section 05/27/2017   Migraine with aura 05/27/2017   Agoraphobia 05/30/2016   History of abnormal cervical Pap smear 01/31/2016   History of kidney stones 01/31/2016    ONSET DATE: Lt DOS: 08/30/21, Rt DOS: 08/07/21   REFERRING DIAG: G56.02 (ICD-10-CM) - Carpal tunnel syndrome of left wrist  THERAPY DIAG:  Pain in left wrist  Muscle weakness (generalized)  Stiffness of right wrist, not elsewhere classified  Localized edema  Stiffness of left wrist, not elsewhere classified  Rationale for Evaluation and Treatment Rehabilitation  PERTINENT HISTORY: Per MD: "Bilateral carpal tunnel releases.  Describes "tightness" in palm.  Would like to work on stretching,  strengthening to get her ready to return to work."   PRECAUTIONS: 4+ weeks s/p Lt CTR, 6 weeks s/p Rt CTR; A/PROM ok now, wait until 4-6 weeks for light hand strength; wait 3-4 months for full,unrestricted use   WEIGHT BEARING RESTRICTIONS No lifting more than 5# in left hand for 1 more week    SUBJECTIVE:  She states doing toothbrush vibration at home and continuing exercises, a bit sore but feeling better.   PAIN:  Are you having pain? Yes- burning, soreness  Rating: 2/10 at rest now    OBJECTIVE: (All objective assessments below are from initial evaluation on: 09/20/21 unless otherwise specified.)   HAND DOMINANCE: Right    ADLs: Overall ADLs: States decreased ability to grab, hold household objects, pain and inability to pen containers, perform all FMS tasks, etc.      FUNCTIONAL OUTCOME MEASURES: Eval: Patient Specific Functional Scale: 3 (open jars, lifting milk carton, pull up her child's pants)   UPPER EXTREMITY ROM                Eval: right hand opposition to SF 76m gap; 1cm gap in left hand opposition    Active ROM Right eval Left eval Left  09/26/21 Left 09/28/21  Wrist flexion 47 19 62 50  Wrist extension 60 51 47 65  (Blank rows = not tested)     UPPER EXTREMITY MMT:      MMT Right eval Left eval  Wrist flexion 4-/5 pain 4+/5  Wrist extension 5/5 3+/5 pain  (Blank rows = not tested)   HAND FUNCTION: 09/26/21: Grip strength Left:  17.8 lbs, Left Palmar Pinch: 4 lbs  (10# right)    Eval: Grip strength Right: 43 lbs, Left: TBD lbs (left not tolerated today)    COORDINATION: Eval: 9 Hole Peg Test Right: 22.3sec, Left: 21.9 sec   SENSATION: Eval: 489mS2PD in right IF, 42m36m2PD in left IF; light touch intact today, though she feels diminished in left hand   EDEMA:             Eval: Mildly swollen in left hand and wrist today compared to right hand    COGNITION: Eval: Overall cognitive status: WFL for evaluation today, though she was anxious at times    OBSERVATIONS:           09/28/21: She states area of most pain is ulnar proximal wrist crease (volarly) and almost directly over hook of hamate area.    Eval: She has overt hypersensitivity (flinching and guarding in pain) in left sx area to light touch and c/o allodynia. Slightly red and swollen there as well. Rt only mildly TTP now. Some mild scar adhesions in right CTR noted with wrist and finger motion and some in left CTR as well.  TODAY'S TREATMENT:  09/28/21: She does wrist AROM for exercise review and new measures which are better in total at wrist now. She then does 6 mins of wrist motion, finger tendon glides, etc. within fluidotherapy for continued desensitization. OT then does fairly strong finger intrinsic stretches and composite flexion stretches (fingers touch palm) and wrist stretches in flex and ext. She then does 2 mins vibration for continued neuro desensitization. OT edu on using metal spoon and contrast bathes to work on hypersensitivity at home as well, and she's given written instructions. She does fnl activities manipulating a pen (in-hand manipulations: rotation, shift, translation) and Velcro board for dynamic pinching, twisting, turning of wrist.  OT does some manual scar mobilization which she doesn't tolerate well, exclaiming out in pain a few times, but OT feels this treatment is necessary. OT also applies k-tape in start pattern over painful ulnar, proximal area of volar wrist where she is most painful/sensitive. She states not feeling very badly by the end of treatment today, which is encouraging after she had been having some overt pain signs. OT encourages her to go home and do contrast bath and rest up before doing HEP again today 2 or 3 more times.    09/26/21: OT again educates on systematic desensitization and today she uses a vibration tool to help with this over left hand scar for 2 minutes (pain gate theory). She tolerates light touch better today as well as  gripping and pinching with left hand, but she likely doesn't have the tolerance or ability to return to work safely in this condition yet. (This is something she's concerned about, causing stress, tears, and likely will negatively impact her recovery.) OT also goes over HEP with her as below, avoiding putty today to focus more on pain relief and motion for now. She states understanding need for rest / avoidance of exacerbation is importance as well as periodic HEP use throughout her day. OT again applies K-tape to pull on scar adhesions around her carpal tunnel which she states was helpful after initial evaluation. Adhesions in her wrist can be seen today (blanching) when she performs tendon glides.   Exercises - Bend and Pull Back Wrist SLOWLY  - 3-4 x daily - 1-2 sets - 10-15 reps - "Windshield Wipers"   - 3-4 x daily - 1-2 sets - 10-15 reps - Touch Thumb to [facility] Finger  - 3-4 x daily - 1-2 sets - 10 reps - Tendon Glides  - 3-4 x daily - 3-5 reps - 2-3 seconds hold - Seated Wrist Flexion Stretch  - 2-3 x daily - 3 reps - 15 hold - Wrist Extension Stretch Pronated  - 2-3 x daily - 3 reps - 15 hold - Full Fist  - 2-3 x daily - 5 reps - Seated Claw Fist with Putty  - 2-3 x daily - 5 reps - "Duck Mouth" Strength  - 2-3 x daily - 5 reps - Finger Extension "Pizza!"   - 2-3 x daily - 5 reps - Thumb Opposition with Putty  - 2-3 x daily - 5 reps - Seated Median Nerve Glide  - 3-4 x daily - 5 reps - Median Nerve Flossing  - 4-6 x daily - 1 sets - 10-15 reps     PATIENT EDUCATION: Education details: See tx section above for details  Person educated: Patient Education method: Verbal Instruction, Teach back, Handouts  Education comprehension: States and demonstrates understanding, Additional Education required      HOME EXERCISE  PROGRAM: Access Code: [email or web address] URL: [email or web address] Prepared by: Benito Mccreedy     GOALS: Goals reviewed with patient? Yes     SHORT TERM GOALS:  (STG required if POC>30 days)   Pt will obtain protective, custom orthotic. Target date: TBD as needed Goal status: MET   2.  Pt will demo/state understanding of initial HEP to improve pain levels and prerequisite motion. Target date: 09/29/21 Goal status: INITIAL     LONG TERM GOALS:   Pt will improve functional ability by decreased impairment per PSFS assessment from 3 to 8 or better, for better quality of life. Target date: 11/03/21 Goal status: INITIAL   2.  Pt will improve grip strength in b/l hands to at least 45lbs with no pain for functional use at home and in IADLs. Target date: 11/03/21 Goal status: INITIAL   3.  Pt will improve A/ROM in b/l wrist flexion to at least 60* each, to have functional motion for tasks like reach and grasp.  Target date: 11/03/21 Goal status: INITIAL   4.  Pt will improve strength in left wrist flexion from painful 3+/5 MMT to at least 4+/5 MMT to have increased functional ability to carry out selfcare and higher-level homecare tasks with no difficulty. Target date: 11/03/21 Goal status: INITIAL     ASSESSMENT:   CLINICAL IMPRESSION: 09/28/21: She had better motion and pain levels at start of session today, and even though she had some higher pain signs in treatment, she left in no significant pain.   09/26/21: She made significant motion improvement today at wrist, tolerating LT better, tolerating gripping better, but due to high anxiety and continued hypersensitivity, OT thinks it would be detrimental for her to start pushing and pulling heavy weight or returning to full, unrestricted use of hand at work yet. OT will speak to MD about his thoughts on this.   PLAN: OT FREQUENCY: 2x/week (tapering as medically indicated)    OT DURATION: 6 weeks (through 11/03/21)   PLANNED INTERVENTIONS: self care/ADL training, therapeutic exercise, therapeutic activity, neuromuscular re-education, manual therapy, scar mobilization, passive range of motion,  splinting, electrical stimulation, ultrasound, fluidotherapy, compression bandaging, moist heat, cryotherapy, contrast bath, patient/family education, coping strategies training, and DME and/or AE instructions   RECOMMENDED OTHER SERVICES: none now    CONSULTED AND AGREED WITH PLAN OF CARE: Patient   PLAN FOR NEXT SESSION:  Continue on per POC, try to add in UBE for endurance and cardio endurance (helps with tolerance, pain threshold and healing)    Benito Mccreedy, OTR/L, CHT 09/28/2021, 11:55 AM

## 2021-10-03 ENCOUNTER — Encounter: Payer: Self-pay | Admitting: Rehabilitative and Restorative Service Providers"

## 2021-10-03 ENCOUNTER — Ambulatory Visit (INDEPENDENT_AMBULATORY_CARE_PROVIDER_SITE_OTHER): Payer: 59 | Admitting: Rehabilitative and Restorative Service Providers"

## 2021-10-03 DIAGNOSIS — M25632 Stiffness of left wrist, not elsewhere classified: Secondary | ICD-10-CM | POA: Diagnosis not present

## 2021-10-03 DIAGNOSIS — M6281 Muscle weakness (generalized): Secondary | ICD-10-CM | POA: Diagnosis not present

## 2021-10-03 DIAGNOSIS — M25532 Pain in left wrist: Secondary | ICD-10-CM | POA: Diagnosis not present

## 2021-10-03 DIAGNOSIS — R600 Localized edema: Secondary | ICD-10-CM | POA: Diagnosis not present

## 2021-10-03 DIAGNOSIS — M25631 Stiffness of right wrist, not elsewhere classified: Secondary | ICD-10-CM

## 2021-10-03 NOTE — Therapy (Signed)
OUTPATIENT OCCUPATIONAL THERAPY TREATMENT NOTE   Patient Name: Crystal Hammond MRN: [id number] DOB:01-04-1981, 41 y.o., female Today's Date: 10/03/2021  PCP: Dr. Elsie Stain REFERRING PROVIDER: Dr. 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, 1980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)   Heart murmur    OUTGREW   History of febrile seizure    History of kidney stones    History of pneumonia    Migraines    OCD (obsessive compulsive disorder)    Oral herpes    Pneumonia    PONV (postoperative nausea and vomiting)    Postpartum depression    Pre-diabetes    Psoriasis    PTSD (post-traumatic stress disorder)    Recurrent UTI    Rosacea    Sleep apnea    Past Surgical History:  Procedure Laterality Date   ABDOMINAL SURGERY  2012   Endometrial Surgery - went through c-section scar   CARPAL TUNNEL RELEASE Right 08/07/2021   Procedure: RIGHT CARPAL TUNNEL RELEASE;  Surgeon: Sherilyn Cooter, MD;  Location: [place];  Service: Orthopedics;  Laterality: Right;   CARPAL TUNNEL RELEASE Left 08/30/2021   Procedure: LEFT CARPAL TUNNEL RELEASE;  Surgeon: Sherilyn Cooter, MD;  Location: [place];  Service: Orthopedics;  Laterality: Left;   CESAREAN SECTION  2019   CESAREAN SECTION  2007   CESAREAN SECTION  2014   CESAREAN SECTION  2016   ENDOMETRIAL BIOPSY  2011   GASTROSCHISIS CLOSURE     LEEP     x 2    VENTRAL HERNIA REPAIR N/A 12/11/2019   Procedure: HERNIA REPAIR VENTRAL ADULT;  Surgeon: Herbert Pun, MD;  Location: [facility];  Service: General;  Laterality: N/A;   WISDOM TOOTH EXTRACTION     XI ROBOTIC ASSISTED VENTRAL HERNIA N/A 12/11/2019   Procedure: XI ROBOTIC ASSISTED VENTRAL HERNIA Lap vs open;  Surgeon: Herbert Pun, MD;  Location: [facility];  Service: General;  Laterality: N/A;   Patient Active Problem List   Diagnosis Date Noted   Carpal tunnel syndrome, left upper limb    Carpal tunnel syndrome, right upper limb    Bilateral carpal tunnel syndrome 07/31/2021   Advance care planning 07/09/2021   Carpal tunnel syndrome 07/09/2021   OSA (obstructive sleep apnea) 07/09/2021   SOBOE (shortness of breath on exertion) 03/19/2021   Tachycardia 03/19/2021   Abdominal pain 02/21/2021   Routine general medical examination at a health care facility 01/15/2021   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   Itching 07/22/2020   Vitamin D deficiency 01/26/2020   Incisional hernia 12/11/2019   Prediabetes 07/26/2019   GAD (generalized anxiety disorder) 06/25/2019   Adult ADHD 06/25/2019   Depressive disorder 06/25/2019   Family history of congenital  anomalies 05/27/2017   Herpes 05/27/2017   Hx of cesarean section 05/27/2017   Migraine with aura 05/27/2017   Agoraphobia 05/30/2016   History of abnormal cervical Pap smear 01/31/2016   History of kidney stones 01/31/2016    ONSET DATE: Lt DOS: 08/30/21, Rt DOS: 08/07/21   REFERRING DIAG: G56.02 (ICD-10-CM) - Carpal tunnel syndrome of left wrist  THERAPY DIAG:  Pain in left wrist  Muscle weakness (generalized)  Stiffness of right wrist, not elsewhere classified  Stiffness of left wrist, not elsewhere classified  Localized edema  Rationale for Evaluation and Treatment Rehabilitation  PERTINENT HISTORY: Per MD: "Bilateral carpal tunnel releases.  Describes "tightness" in palm.  Would like to work on stretching,  strengthening to get her ready to return to work."   PRECAUTIONS: 5 weeks s/p Lt CTR, 6 weeks s/p Rt CTR; A/PROM ok now, wait until 4-6 weeks for light hand strength; wait 3-4 months for full,unrestricted use   WEIGHT BEARING RESTRICTIONS WBAT, avoid painful activities or heavy, repetitive activities 1 more month    SUBJECTIVE:  She states having a good trip to [place] this weekend, went out on their boat, no significant problems or pain. She states enjoying hat to help with pains at home.   PAIN:  Are you having pain?  Yes- burning, soreness  Rating: 5/10 at rest now    OBJECTIVE: (All objective assessments below are from initial evaluation on: 09/20/21 unless otherwise specified.)   HAND DOMINANCE: Right    ADLs: Overall ADLs: States decreased ability to grab, hold household objects, pain and inability to pen containers, perform all FMS tasks, etc.      FUNCTIONAL OUTCOME MEASURES: Eval: Patient Specific Functional Scale: 3 (open jars, lifting milk carton, pull up her child's pants)   UPPER EXTREMITY ROM                Eval: right hand opposition to SF 80mm gap; 1cm gap in left hand opposition    Active ROM Right eval Left eval Left  09/26/21 Left 09/28/21  Wrist flexion 47 19 62 50  Wrist extension 60 51 47 65  (Blank rows = not tested)     UPPER EXTREMITY MMT:      MMT Right eval Left eval  Wrist flexion 4-/5 pain 4+/5  Wrist extension 5/5 3+/5 pain  (Blank rows = not tested)   HAND FUNCTION: 09/26/21: Grip strength Left:  17.8 lbs, Left Palmar Pinch: 4 lbs  (10# right)    Eval: Grip strength Right: 43 lbs, Left: TBD lbs (left not tolerated today)    COORDINATION: Eval: 9 Hole Peg Test Right: 22.3sec, Left: 21.9 sec   SENSATION: Eval: 85mm S2PD in right IF, 30mm S2PD in left IF; light touch intact today, though she feels diminished in left hand   EDEMA:             Eval: Mildly swollen in left hand and wrist today compared to right hand    COGNITION: Eval:  Overall cognitive status: WFL for evaluation today, though she was anxious at times   OBSERVATIONS:           09/28/21: She states area of most pain is ulnar proximal wrist crease (volarly) and almost directly over hook of hamate area.    Eval: She has overt hypersensitivity (flinching and guarding in pain) in left sx area to light touch and c/o allodynia. Slightly red and swollen there as well. Rt only mildly TTP now. Some mild scar adhesions in  right CTR noted with wrist and finger motion and some in left CTR as well.      TODAY'S TREATMENT:  10/03/21: She starts with tendon glides in fluidotherapy for desensitization and activity for 5 mins. OT then does manual stretches and IASTM through wrist and FA to aide motion and mobility. She is flinching in pain and wants to look away during this and even light touch and she is encouraged to do desensitization at home 4-6 x day as asked, because she really shouldn't be this hypersensitive after 2 weeks of progressive desensitization, if she's been consistent. She then uses UBE for cardio and fnl arm use (push/pull), to help with avoidance of hand/arm use. (Does 6 mins _0 + PRM and up to 4.0 resistance.) She uses pink t-putty for review and to pick out (FMS) small pieces from the putty. She then reviews/performs pinches "duck mouth" finger ext "pizza." She seems somewhat unfamiliar with these (likely not doing 3-4 x day), and is asked to do more often at home  09/28/21: She does wrist AROM for exercise review and new measures which are better in total at wrist now. She then does 6 mins of wrist motion, finger tendon glides, etc. within fluidotherapy for continued desensitization. OT then does fairly strong finger intrinsic stretches and composite flexion stretches (fingers touch palm) and wrist stretches in flex and ext. She then does 2 mins vibration for continued neuro desensitization. OT edu on using metal spoon and contrast bathes to work on hypersensitivity  at home as well, and she's given written instructions. She does fnl activities manipulating a pen (in-hand manipulations: rotation, shift, translation) and Velcro board for dynamic pinching, twisting, turning of wrist.  OT does some manual scar mobilization which she doesn't tolerate well, exclaiming out in pain a few times, but OT feels this treatment is necessary. OT also applies k-tape in start pattern over painful ulnar, proximal area of volar wrist where she is most painful/sensitive. She states not feeling very badly by the end of treatment today, which is encouraging after she had been having some overt pain signs. OT encourages her to go home and do contrast bath and rest up before doing HEP again today 2 or 3 more times.    09/26/21: OT again educates on systematic desensitization and today she uses a vibration tool to help with this over left hand scar for 2 minutes (pain gate theory). She tolerates light touch better today as well as gripping and pinching with left hand, but she likely doesn't have the tolerance or ability to return to work safely in this condition yet. (This is something she's concerned about, causing stress, tears, and likely will negatively impact her recovery.) OT also goes over HEP with her as below, avoiding putty today to focus more on pain relief and motion for now. She states understanding need for rest / avoidance of exacerbation is importance as well as periodic HEP use throughout her day. OT again applies K-tape to pull on scar adhesions around her carpal tunnel which she states was helpful after initial evaluation. Adhesions in her wrist can be seen today (blanching) when she performs tendon glides.   Exercises - Bend and Pull Back Wrist SLOWLY  - 3-4 x daily - 1-2 sets - 10-15 reps - "Windshield Wipers"   - 3-4 x daily - 1-2 sets - 10-15 reps - Touch Thumb to [facility] Finger  - 3-4 x daily - 1-2 sets - 10 reps - Tendon Glides  - 3-4 x  daily - 3-5 reps - 2-3 seconds  hold - Seated Wrist Flexion Stretch  - 2-3 x daily - 3 reps - 15 hold - Wrist Extension Stretch Pronated  - 2-3 x daily - 3 reps - 15 hold - Full Fist  - 2-3 x daily - 5 reps - Seated Claw Fist with Putty  - 2-3 x daily - 5 reps - "Duck Mouth" Strength  - 2-3 x daily - 5 reps - Finger Extension "Pizza!"   - 2-3 x daily - 5 reps - Thumb Opposition with Putty  - 2-3 x daily - 5 reps - Seated Median Nerve Glide  - 3-4 x daily - 5 reps - Median Nerve Flossing  - 4-6 x daily - 1 sets - 10-15 reps     PATIENT EDUCATION: Education details: See tx section above for details  Person educated: Patient Education method: Verbal Instruction, Teach back, Handouts  Education comprehension: States and demonstrates understanding, Additional Education required      HOME EXERCISE PROGRAM: Access Code: [email or web address] URL: [email or web address] Prepared by: Benito Mccreedy     GOALS: Goals reviewed with patient? Yes     SHORT TERM GOALS: (STG required if POC>30 days)   Pt will obtain protective, custom orthotic. Target date: TBD as needed Goal status: MET   2.  Pt will demo/state understanding of initial HEP to improve pain levels and prerequisite motion. Target date: 09/29/21 Goal status: INITIAL     LONG TERM GOALS:   Pt will improve functional ability by decreased impairment per PSFS assessment from 3 to 8 or better, for better quality of life. Target date: 11/03/21 Goal status: INITIAL   2.  Pt will improve grip strength in b/l hands to at least 45lbs with no pain for functional use at home and in IADLs. Target date: 11/03/21 Goal status: INITIAL   3.  Pt will improve A/ROM in b/l wrist flexion to at least 60* each, to have functional motion for tasks like reach and grasp.  Target date: 11/03/21 Goal status: INITIAL   4.  Pt will improve strength in left wrist flexion from painful 3+/5 MMT to at least 4+/5 MMT to have increased functional ability to carry out selfcare and  higher-level homecare tasks with no difficulty. Target date: 11/03/21 Goal status: INITIAL     ASSESSMENT:   CLINICAL IMPRESSION: 10/03/21: Continue to progress tolerance to touch, motion, strengt in whole arm as she states fatigue and soreness in left arm after UBE today.   09/28/21: She had better motion and pain levels at start of session today, and even though she had some higher pain signs in treatment, she left in no significant pain.   09/26/21: She made significant motion improvement today at wrist, tolerating LT better, tolerating gripping better, but due to high anxiety and continued hypersensitivity, OT thinks it would be detrimental for her to start pushing and pulling heavy weight or returning to full, unrestricted use of hand at work yet. OT will speak to MD about his thoughts on this.   PLAN: OT FREQUENCY: 2x/week (tapering as medically indicated)    OT DURATION: 6 weeks (through 11/03/21)   PLANNED INTERVENTIONS: self care/ADL training, therapeutic exercise, therapeutic activity, neuromuscular re-education, manual therapy, scar mobilization, passive range of motion, splinting, electrical stimulation, ultrasound, fluidotherapy, compression bandaging, moist heat, cryotherapy, contrast bath, patient/family education, coping strategies training, and DME and/or AE instructions   RECOMMENDED OTHER SERVICES: none now    CONSULTED AND AGREED WITH PLAN OF CARE: Patient  PLAN FOR NEXT SESSION:  Continue on per POC, including whole arm strength and fnl tasks   Kerryann Allaire, OTR/L, CHT 10/03/2021, 11:46 AM

## 2021-10-05 ENCOUNTER — Ambulatory Visit (INDEPENDENT_AMBULATORY_CARE_PROVIDER_SITE_OTHER): Payer: 59 | Admitting: Orthopedic Surgery

## 2021-10-05 ENCOUNTER — Ambulatory Visit (INDEPENDENT_AMBULATORY_CARE_PROVIDER_SITE_OTHER): Payer: 59 | Admitting: Rehabilitative and Restorative Service Providers"

## 2021-10-05 DIAGNOSIS — G5602 Carpal tunnel syndrome, left upper limb: Secondary | ICD-10-CM

## 2021-10-05 DIAGNOSIS — M25632 Stiffness of left wrist, not elsewhere classified: Secondary | ICD-10-CM | POA: Diagnosis not present

## 2021-10-05 DIAGNOSIS — M25631 Stiffness of right wrist, not elsewhere classified: Secondary | ICD-10-CM

## 2021-10-05 DIAGNOSIS — M25532 Pain in left wrist: Secondary | ICD-10-CM | POA: Diagnosis not present

## 2021-10-05 DIAGNOSIS — M6281 Muscle weakness (generalized): Secondary | ICD-10-CM

## 2021-10-05 DIAGNOSIS — R600 Localized edema: Secondary | ICD-10-CM | POA: Diagnosis not present

## 2021-10-05 NOTE — Therapy (Signed)
OUTPATIENT OCCUPATIONAL THERAPY TREATMENT NOTE   Patient Name: Crystal Hammond MRN: [id number] DOB:1980-07-03, 41 y.o., female Today's Date: 10/05/2021  PCP: Dr. Elsie Stain REFERRING PROVIDER: Dr. 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)   Heart murmur    OUTGREW   History of febrile seizure    History of kidney stones    History of pneumonia    Migraines    OCD (obsessive compulsive disorder)    Oral herpes    Pneumonia    PONV (postoperative nausea and vomiting)    Postpartum depression    Pre-diabetes    Psoriasis    PTSD (post-traumatic stress disorder)    Recurrent UTI    Rosacea    Sleep apnea    Past Surgical History:  Procedure Laterality Date   ABDOMINAL SURGERY  2012   Endometrial Surgery - went through c-section scar   CARPAL TUNNEL RELEASE Right 08/07/2021   Procedure: RIGHT CARPAL TUNNEL RELEASE;  Surgeon: Sherilyn Cooter, MD;  Location: [place];  Service: Orthopedics;  Laterality: Right;   CARPAL TUNNEL RELEASE Left 08/30/2021   Procedure: LEFT CARPAL TUNNEL RELEASE;  Surgeon: Sherilyn Cooter, MD;  Location: [place];  Service: Orthopedics;  Laterality: Left;   CESAREAN SECTION  2019   CESAREAN SECTION  2007   CESAREAN SECTION  2014   CESAREAN SECTION  2016   ENDOMETRIAL BIOPSY  2011   GASTROSCHISIS CLOSURE     LEEP     x 2    VENTRAL HERNIA REPAIR N/A 12/11/2019   Procedure: HERNIA REPAIR VENTRAL ADULT;  Surgeon: Herbert Pun, MD;  Location: [facility];  Service: General;  Laterality: N/A;   WISDOM TOOTH EXTRACTION     XI ROBOTIC ASSISTED VENTRAL HERNIA N/A 12/11/2019   Procedure: XI ROBOTIC ASSISTED VENTRAL HERNIA Lap vs open;  Surgeon: Herbert Pun, MD;  Location: [facility];  Service: General;  Laterality: N/A;   Patient Active Problem List   Diagnosis Date Noted   Carpal tunnel syndrome, left upper limb    Carpal tunnel syndrome, right upper limb    Bilateral carpal tunnel syndrome 07/31/2021   Advance care planning 07/09/2021   Carpal tunnel syndrome 07/09/2021   OSA (obstructive sleep apnea) 07/09/2021   SOBOE (shortness of breath on exertion) 03/19/2021   Tachycardia 03/19/2021   Abdominal pain 02/21/2021   Routine general medical examination at a health care facility 01/15/2021   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   Itching 07/22/2020   Vitamin D deficiency 01/26/2020   Incisional hernia 12/11/2019   Prediabetes 07/26/2019   GAD (generalized anxiety disorder) 06/25/2019   Adult ADHD 06/25/2019   Depressive disorder 06/25/2019   Family history of  congenital anomalies 05/27/2017   Herpes 05/27/2017   Hx of cesarean section 05/27/2017   Migraine with aura 05/27/2017   Agoraphobia 05/30/2016   History of abnormal cervical Pap smear 01/31/2016   History of kidney stones 01/31/2016    ONSET DATE: Lt DOS: 08/30/21, Rt DOS: 08/07/21   REFERRING DIAG: G56.02 (ICD-10-CM) - Carpal tunnel syndrome of left wrist  THERAPY DIAG:  Pain in left wrist  Muscle weakness (generalized)  Stiffness of right wrist, not elsewhere classified  Localized edema  Stiffness of left wrist, not elsewhere classified  Rationale for Evaluation and Treatment Rehabilitation  PERTINENT HISTORY: Per MD: "Bilateral carpal tunnel releases.  Describes "tightness" in palm.  Would like to work on stretching,  strengthening to get her ready to return to work."   PRECAUTIONS: 5+ weeks s/p Lt CTR, 6 weeks s/p Rt CTR; A/PROM ok now, wait until 4-6 weeks for light hand strength; wait 3-4 months for full,unrestricted use   WEIGHT BEARING RESTRICTIONS WBAT, avoid painful activities or heavy, repetitive activities 1 more month    SUBJECTIVE:  She states being able to touch and rub more often more vigorously now though still somewhat painful and guarded. She also states being able to cook dinner with use of both hands last night.    PAIN:  Are you having pain?  Yes- burning, soreness  Rating: 3/10 at rest now    OBJECTIVE: (All objective assessments below are from initial evaluation on: 09/20/21 unless otherwise specified.)   HAND DOMINANCE: Right    ADLs: Overall ADLs: States decreased ability to grab, hold household objects, pain and inability to pen containers, perform all FMS tasks, etc.      FUNCTIONAL OUTCOME MEASURES: Eval: Patient Specific Functional Scale: 3 (open jars, lifting milk carton, pull up her child's pants)   UPPER EXTREMITY ROM                Eval: right hand opposition to SF 42m gap; 1cm gap in left hand opposition    Active ROM Right eval Left eval Left  09/26/21 Left 09/28/21  Wrist flexion 47 19 62 50  Wrist extension 60 51 47 65  (Blank rows = not tested)     UPPER EXTREMITY MMT:      MMT Right eval Left eval  Wrist flexion 4-/5 pain 4+/5  Wrist extension 5/5 3+/5 pain  (Blank rows = not tested)   HAND FUNCTION: 09/26/21: Grip strength Left:  17.8 lbs, Left Palmar Pinch: 4 lbs  (10# right)    Eval: Grip strength Right: 43 lbs, Left: TBD lbs (left not tolerated today)    COORDINATION: Eval: 9 Hole Peg Test Right: 22.3sec, Left: 21.9 sec   SENSATION: Eval: 4257mS2PD in right IF, 57m28m2PD in left IF; light touch intact today, though she feels diminished in left hand   EDEMA:             Eval: Mildly swollen in left hand and wrist today compared to right  hand    COGNITION: Eval: Overall cognitive status: WFL for evaluation today, though she was anxious at times   OBSERVATIONS:           09/28/21: She states area of most pain is ulnar proximal wrist crease (volarly) and almost directly over hook of hamate area.    Eval: She has overt hypersensitivity (flinching and guarding in pain) in left sx area to light touch and c/o allodynia. Slightly red and swollen there as well. Rt only mildly TTP  now. Some mild scar adhesions in right CTR noted with wrist and finger motion and some in left CTR as well.      TODAY'S TREATMENT:  10/05/21: She starts with tendon glides and wrist AROM in fluidotherapy for 5 mins, followed by push/pull activity on UBE (does 6 mins _0 + PRM and up to 4.5 resistance), farmer's carry with 10# weights for proprioceptive input to help regulate nerves/sensation (similar to stress loading/CRPS program); 4 x 65f. She then uses FMS pegs and manipulatives at the table for funl activity and use of hands.  She is tired and sore at end, but no burning or sharp nerve pain.   10/03/21: She starts with tendon glides in fluidotherapy for desensitization and activity for 5 mins. OT then does manual stretches and IASTM through wrist and FA to aide motion and mobility. She is flinching in pain and wants to look away during this and even light touch and she is encouraged to do desensitization at home 4-6 x day as asked, because she really shouldn't be this hypersensitive after 2 weeks of progressive desensitization, if she's been consistent. She then uses UBE for cardio and fnl arm use (push/pull), to help with avoidance of hand/arm use. (Does 6 mins _1 + PRM and up to 4.0 resistance.) She uses pink t-putty for review and to pick out (FMS) small pieces from the putty. She then reviews/performs pinches "duck mouth" finger ext "pizza." She seems somewhat unfamiliar with these (likely not doing 3-4 x day), and is asked to do more often at home  09/28/21:  She does wrist AROM for exercise review and new measures which are better in total at wrist now. She then does 6 mins of wrist motion, finger tendon glides, etc. within fluidotherapy for continued desensitization. OT then does fairly strong finger intrinsic stretches and composite flexion stretches (fingers touch palm) and wrist stretches in flex and ext. She then does 2 mins vibration for continued neuro desensitization. OT edu on using metal spoon and contrast bathes to work on hypersensitivity at home as well, and she's given written instructions. She does fnl activities manipulating a pen (in-hand manipulations: rotation, shift, translation) and Velcro board for dynamic pinching, twisting, turning of wrist.  OT does some manual scar mobilization which she doesn't tolerate well, exclaiming out in pain a few times, but OT feels this treatment is necessary. OT also applies k-tape in start pattern over painful ulnar, proximal area of volar wrist where she is most painful/sensitive. She states not feeling very badly by the end of treatment today, which is encouraging after she had been having some overt pain signs. OT encourages her to go home and do contrast bath and rest up before doing HEP again today 2 or 3 more times.    09/26/21: OT again educates on systematic desensitization and today she uses a vibration tool to help with this over left hand scar for 2 minutes (pain gate theory). She tolerates light touch better today as well as gripping and pinching with left hand, but she likely doesn't have the tolerance or ability to return to work safely in this condition yet. (This is something she's concerned about, causing stress, tears, and likely will negatively impact her recovery.) OT also goes over HEP with her as below, avoiding putty today to focus more on pain relief and motion for now. She states understanding need for rest / avoidance of exacerbation is importance as well as periodic HEP use throughout  her day. OT again applies K-tape  to pull on scar adhesions around her carpal tunnel which she states was helpful after initial evaluation. Adhesions in her wrist can be seen today (blanching) when she performs tendon glides.   Exercises - Bend and Pull Back Wrist SLOWLY  - 3-4 x daily - 1-2 sets - 10-15 reps - "Windshield Wipers"   - 3-4 x daily - 1-2 sets - 10-15 reps - Touch Thumb to [facility] Finger  - 3-4 x daily - 1-2 sets - 10 reps - Tendon Glides  - 3-4 x daily - 3-5 reps - 2-3 seconds hold - Seated Wrist Flexion Stretch  - 2-3 x daily - 3 reps - 15 hold - Wrist Extension Stretch Pronated  - 2-3 x daily - 3 reps - 15 hold - Full Fist  - 2-3 x daily - 5 reps - Seated Claw Fist with Putty  - 2-3 x daily - 5 reps - "Duck Mouth" Strength  - 2-3 x daily - 5 reps - Finger Extension "Pizza!"   - 2-3 x daily - 5 reps - Thumb Opposition with Putty  - 2-3 x daily - 5 reps - Seated Median Nerve Glide  - 3-4 x daily - 5 reps - Median Nerve Flossing  - 4-6 x daily - 1 sets - 10-15 reps     PATIENT EDUCATION: Education details: See tx section above for details  Person educated: Patient Education method: Verbal Instruction, Teach back, Handouts  Education comprehension: States and demonstrates understanding, Additional Education required      HOME EXERCISE PROGRAM: Access Code: [email or web address] URL: [email or web address] Prepared by: Benito Mccreedy     GOALS: Goals reviewed with patient? Yes     SHORT TERM GOALS: (STG required if POC>30 days)   Pt will obtain protective, custom orthotic. Target date: TBD as needed Goal status: MET   2.  Pt will demo/state understanding of initial HEP to improve pain levels and prerequisite motion. Target date: 09/29/21 Goal status: INITIAL     LONG TERM GOALS:   Pt will improve functional ability by decreased impairment per PSFS assessment from 3 to 8 or better, for better quality of life. Target date: 11/03/21 Goal status: INITIAL   2.   Pt will improve grip strength in b/l hands to at least 45lbs with no pain for functional use at home and in IADLs. Target date: 11/03/21 Goal status: INITIAL   3.  Pt will improve A/ROM in b/l wrist flexion to at least 60* each, to have functional motion for tasks like reach and grasp.  Target date: 11/03/21 Goal status: INITIAL   4.  Pt will improve strength in left wrist flexion from painful 3+/5 MMT to at least 4+/5 MMT to have increased functional ability to carry out selfcare and higher-level homecare tasks with no difficulty. Target date: 11/03/21 Goal status: INITIAL     ASSESSMENT:   CLINICAL IMPRESSION: 10/05/21: She continues to improve activity and tolerance to touch, etc.   10/03/21: Continue to progress tolerance to touch, motion, strengt in whole arm as she states fatigue and soreness in left arm after UBE today.   09/28/21: She had better motion and pain levels at start of session today, and even though she had some higher pain signs in treatment, she left in no significant pain.    PLAN: OT FREQUENCY: 2x/week (tapering as medically indicated)    OT DURATION: 6 weeks (through 11/03/21)   PLANNED INTERVENTIONS: self care/ADL training, therapeutic exercise, therapeutic activity, neuromuscular re-education, manual therapy, scar mobilization, passive  range of motion, splinting, electrical stimulation, ultrasound, fluidotherapy, compression bandaging, moist heat, cryotherapy, contrast bath, patient/family education, coping strategies training, and DME and/or AE instructions   RECOMMENDED OTHER SERVICES: none now    CONSULTED AND AGREED WITH PLAN OF CARE: Patient   PLAN FOR NEXT SESSION:  Continue on per POC, including fnl activities, whole arm strength, etc.  Benito Mccreedy, OTR/L, CHT 10/05/2021, 11:38 AM

## 2021-10-05 NOTE — Progress Notes (Signed)
Post-Op Visit Note   Patient: Crystal Hammond           Date of Birth: [birth year]           MRN: [id number] Visit Date: 10/05/2021 PCP: [name], MD   Assessment & Plan:  Chief Complaint:  Chief Complaint  Patient presents with   Left Hand - Routine Post Op    Doing better   Visit Diagnoses:  1. Carpal tunnel syndrome, left upper limb     Plan: Patient is now approximately 5 weeks out from her left carpal tunnel release.  She had a superficial suture abscess which resolved with removal of the sutures.  She has had some pillar pain postoperatively which has been bothersome for her.  She has been working hard with therapy on scar desensitization and range of motion.  She is overall much improved today.  She is happy with her progress so far.  She has not had any numbness, paresthesias or nocturnal symptoms that she was having before.  She will continue therapy and can follow-up with me as needed.  Follow-Up Instructions: No follow-ups on file.   Orders:  No orders of the defined types were placed in this encounter.  No orders of the defined types were placed in this encounter.   Imaging: No results found.  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   Itching 07/22/2020   Vitamin D deficiency 01/26/2020   Incisional hernia 12/11/2019   Prediabetes 07/26/2019   GAD (generalized anxiety disorder) 06/25/2019   Adult ADHD 06/25/2019   Depressive disorder 06/25/2019   Family  history of congenital anomalies 05/27/2017   Herpes 05/27/2017   Hx of cesarean section 05/27/2017   Migraine with aura 05/27/2017   Agoraphobia 05/30/2016   History of abnormal cervical Pap smear 01/31/2016   History of kidney stones 01/31/2016   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)   Heart murmur    OUTGREW   History of febrile seizure    History of kidney stones    History of pneumonia    Migraines    OCD (obsessive compulsive disorder)    Oral herpes    Pneumonia    PONV (postoperative nausea and vomiting)    Postpartum depression    Pre-diabetes    Psoriasis    PTSD (post-traumatic stress disorder)    Recurrent UTI    Rosacea    Sleep apnea     Family History  Problem Relation Age of Onset   Depression Mother    Alcohol abuse Mother    Hernia Mother    Rashes / Skin problems Mother    Thyroid disease Mother    Anxiety disorder Mother    Depression Father    Alcohol abuse Father    Gout Father    Diabetes  Father    Asthma Brother    Bipolar disorder Brother    Drug abuse Brother    ADD / ADHD Brother    Breast cancer Maternal Aunt    Lung cancer Maternal Aunt    Colon cancer Paternal Grandmother    Allergic rhinitis Son    Asthma Son    ADD / ADHD Son    Allergic rhinitis Son    ADD / ADHD Son    Ovarian cancer Neg Hx     Past Surgical History:  Procedure Laterality Date   ABDOMINAL SURGERY  2012   Endometrial Surgery - went through c-section scar   CARPAL TUNNEL RELEASE Right 08/07/2021   Procedure: RIGHT CARPAL TUNNEL RELEASE;  Surgeon: Marlyne Beards, MD;  Location: [facility];  Service: Orthopedics;  Laterality: Right;   CARPAL TUNNEL RELEASE Left 08/30/2021   Procedure: LEFT CARPAL TUNNEL RELEASE;  Surgeon: Marlyne Beards, MD;  Location: [facility];  Service:  Orthopedics;  Laterality: Left;   CESAREAN SECTION  2019   CESAREAN SECTION  2007   CESAREAN SECTION  2014   CESAREAN SECTION  2016   ENDOMETRIAL BIOPSY  2011   GASTROSCHISIS CLOSURE     LEEP     x 2   VENTRAL HERNIA REPAIR N/A 12/11/2019   Procedure: HERNIA REPAIR VENTRAL ADULT;  Surgeon: Carolan Shiver, MD;  Location: [facility];  Service: General;  Laterality: N/A;   WISDOM TOOTH EXTRACTION     XI ROBOTIC ASSISTED VENTRAL HERNIA N/A 12/11/2019   Procedure: XI ROBOTIC ASSISTED VENTRAL HERNIA Lap vs open;  Surgeon: Carolan Shiver, MD;  Location: [facility];  Service: General;  Laterality: N/A;   Social History   Occupational History   Occupation: Curator: [employer]  Tobacco Use   Smoking status: Former    Packs/day: 0.30    Years: 3.00    Total pack years: 0.90    Types: Cigarettes    Quit date: 2013    Years since quitting: 10.4    Passive exposure: Past   Smokeless tobacco: Never  Vaping Use   Vaping Use: Never used  Substance and Sexual Activity   Alcohol use: Yes    Alcohol/week: 2.0 standard drinks of alcohol    Types: 2 Cans of beer per week   Drug use: Never   Sexual activity: Yes    Birth control/protection: None

## 2021-10-09 ENCOUNTER — Ambulatory Visit (INDEPENDENT_AMBULATORY_CARE_PROVIDER_SITE_OTHER): Payer: 59 | Admitting: Psychologist

## 2021-10-09 DIAGNOSIS — F331 Major depressive disorder, recurrent, moderate: Secondary | ICD-10-CM | POA: Diagnosis not present

## 2021-10-09 DIAGNOSIS — Z634 Disappearance and death of family member: Secondary | ICD-10-CM

## 2021-10-09 DIAGNOSIS — F4310 Post-traumatic stress disorder, unspecified: Secondary | ICD-10-CM | POA: Diagnosis not present

## 2021-10-11 ENCOUNTER — Encounter: Payer: Self-pay | Admitting: Rehabilitative and Restorative Service Providers"

## 2021-10-11 ENCOUNTER — Telehealth: Payer: Self-pay | Admitting: Orthopedic Surgery

## 2021-10-11 ENCOUNTER — Ambulatory Visit: Payer: 59 | Admitting: Rehabilitative and Restorative Service Providers"

## 2021-10-11 DIAGNOSIS — M25631 Stiffness of right wrist, not elsewhere classified: Secondary | ICD-10-CM

## 2021-10-11 DIAGNOSIS — R600 Localized edema: Secondary | ICD-10-CM | POA: Diagnosis not present

## 2021-10-11 DIAGNOSIS — M6281 Muscle weakness (generalized): Secondary | ICD-10-CM

## 2021-10-11 DIAGNOSIS — M25532 Pain in left wrist: Secondary | ICD-10-CM | POA: Diagnosis not present

## 2021-10-11 DIAGNOSIS — M25632 Stiffness of left wrist, not elsewhere classified: Secondary | ICD-10-CM

## 2021-10-11 DIAGNOSIS — G4733 Obstructive sleep apnea (adult) (pediatric): Secondary | ICD-10-CM | POA: Diagnosis not present

## 2021-10-11 NOTE — Telephone Encounter (Signed)
Please advise on work status. She is s/p L CTR 08/30/2021 and R CTR 08/07/2021

## 2021-10-11 NOTE — Therapy (Signed)
OUTPATIENT OCCUPATIONAL THERAPY TREATMENT NOTE   Patient Name: Crystal Hammond MRN: [id number] DOB:10/21/1980, 41 y.o., female Today's Date: 10/11/2021  PCP: Dr. Elsie Stain REFERRING PROVIDER: Dr. 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)   Heart murmur    OUTGREW   History of febrile seizure    History of kidney stones    History of pneumonia    Migraines    OCD (obsessive compulsive disorder)    Oral herpes    Pneumonia    PONV (postoperative nausea and vomiting)    Postpartum depression    Pre-diabetes    Psoriasis    PTSD (post-traumatic stress disorder)    Recurrent UTI    Rosacea    Sleep apnea    Past Surgical History:  Procedure Laterality Date   ABDOMINAL SURGERY  2012   Endometrial Surgery - went through c-section scar   CARPAL TUNNEL RELEASE Right 08/07/2021   Procedure: RIGHT CARPAL TUNNEL RELEASE;  Surgeon: Sherilyn Cooter, MD;  Location: [place];  Service: Orthopedics;  Laterality: Right;   CARPAL TUNNEL RELEASE Left 08/30/2021   Procedure: LEFT CARPAL TUNNEL RELEASE;  Surgeon: Sherilyn Cooter, MD;  Location: [place];  Service: Orthopedics;  Laterality: Left;   CESAREAN SECTION  2019   CESAREAN SECTION  2007   CESAREAN SECTION  2014   CESAREAN SECTION  2016   ENDOMETRIAL BIOPSY  2011   GASTROSCHISIS CLOSURE     LEEP     x 2    VENTRAL HERNIA REPAIR N/A 12/11/2019   Procedure: HERNIA REPAIR VENTRAL ADULT;  Surgeon: Herbert Pun, MD;  Location: [facility];  Service: General;  Laterality: N/A;   WISDOM TOOTH EXTRACTION     XI ROBOTIC ASSISTED VENTRAL HERNIA N/A 12/11/2019   Procedure: XI ROBOTIC ASSISTED VENTRAL HERNIA Lap vs open;  Surgeon: Herbert Pun, MD;  Location: [facility];  Service: General;  Laterality: N/A;   Patient Active Problem List   Diagnosis Date Noted   Carpal tunnel syndrome, left upper limb    Carpal tunnel syndrome, right upper limb    Bilateral carpal tunnel syndrome 07/31/2021   Advance care planning 07/09/2021   Carpal tunnel syndrome 07/09/2021   OSA (obstructive sleep apnea) 07/09/2021   SOBOE (shortness of breath on exertion) 03/19/2021   Tachycardia 03/19/2021   Abdominal pain 02/21/2021   Routine general medical examination at a health care facility 01/15/2021   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   Itching 07/22/2020   Vitamin D deficiency 01/26/2020   Incisional hernia 12/11/2019   Prediabetes 07/26/2019   GAD (generalized anxiety disorder) 06/25/2019   Adult ADHD 06/25/2019   Depressive disorder 06/25/2019   Family history of congenital anomalies 05/27/2017  Herpes 05/27/2017   Hx of cesarean section 05/27/2017   Migraine with aura 05/27/2017   Agoraphobia 05/30/2016   History of abnormal cervical Pap smear 01/31/2016   History of kidney stones 01/31/2016    ONSET DATE: Lt DOS: 08/30/21, Rt DOS: 08/07/21   REFERRING DIAG: G56.02 (ICD-10-CM) - Carpal tunnel syndrome of left wrist  THERAPY DIAG:  Pain in left wrist  Muscle weakness (generalized)  Stiffness of right wrist, not elsewhere classified  Stiffness of left wrist, not elsewhere classified  Localized edema  Rationale for Evaluation and Treatment Rehabilitation  PERTINENT HISTORY: Per MD: "Bilateral carpal tunnel releases.  Describes "tightness" in palm.  Would like to work on stretching,  strengthening to get her ready to return to work."   PRECAUTIONS: 6 weeks s/p Lt CTR, 6 weeks s/p Rt CTR; A/PROM ok now, wait until 4-6 weeks for light hand strength; wait 3-4 months for full,unrestricted use   WEIGHT BEARING RESTRICTIONS WBAT, avoid painful activities or heavy, repetitive activities 1 more month    SUBJECTIVE:  She states now trying to do almost everything at home, lifting gallon of milk, shopping, etc and is a bit sore, but only mild-moderate now. OT feels that she was doing more of these activities and less HEP, unfortunately.   PAIN:  Are you having pain?  Yes- burning, soreness  Rating: 3/10 at rest now    OBJECTIVE: (All objective assessments below are from initial evaluation on: 09/20/21 unless otherwise specified.)   HAND DOMINANCE: Right    ADLs: Overall ADLs: States decreased ability to grab, hold household objects, pain and inability to pen containers, perform all FMS tasks, etc.      FUNCTIONAL OUTCOME MEASURES: Eval: Patient Specific Functional Scale: 3 (open jars, lifting milk carton, pull up her child's pants)   UPPER EXTREMITY ROM                Eval: right hand opposition to SF 53mm gap; 1cm gap in left hand opposition    Active ROM Right eval Left eval Left  09/26/21 Left 09/28/21 Left 10/11/21  Wrist flexion 47 19 62 50 54  Wrist extension 60 51 47 65 32  (Blank rows = not tested)     UPPER EXTREMITY MMT:      MMT Right eval Left eval  Wrist flexion 4-/5 pain 4+/5  Wrist extension 5/5 3+/5 pain  (Blank rows = not tested)   HAND FUNCTION: 09/26/21: Grip strength Left:  17.8 lbs, Left Palmar Pinch: 4 lbs  (10# right)    Eval: Grip strength Right: 43 lbs, Left: TBD lbs (left not tolerated today)    COORDINATION: 10/11/21: 9 Hole Peg Test Right: 18.5sec, Left: 23 sec  Eval: 9 Hole Peg Test Right: 22.3sec, Left: 21.9 sec   SENSATION: Eval: 26mm S2PD in right IF, 5mm S2PD in left IF; light touch intact today, though she feels diminished  in left hand   EDEMA:             Eval: Mildly swollen in left hand and wrist today compared to right hand    COGNITION: Eval: Overall cognitive status: WFL for evaluation today, though she was anxious at times   OBSERVATIONS:           09/28/21: She states area of most pain is ulnar proximal wrist crease (volarly) and almost directly over hook of hamate area.    Eval: She has overt hypersensitivity (flinching and guarding in pain) in left sx area to light touch  and c/o allodynia. Slightly red and swollen there as well. Rt only mildly TTP now. Some mild scar adhesions in right CTR noted with wrist and finger motion and some in left CTR as well.      TODAY'S TREATMENT:  10/11/21: She starts with 6 mins fluidotherapy for desensitization and AROM in heat, tolerates without problems now. She then does FMS 9HPT and motions at wrist for measurement check. Motion in ext is much tighter today and she was reminded to focus on ext stretches, also FMS slightly worse today despite lower pain complaint. OT will encourage more FMS skills.  She uses vibration which is strangely more hypersensitive in distal scar and less proximally now (this is opposite of what it has been).  OT encourages her to keep on light touch/progressive desensitizations 4-6 x day now. OT then does manual stretches to tight thumb and palm /wrist today. She reviews FMS in-hand manipulations, dropping pen frequently. She is also able to translate a quarter from her palm to fingertips today, showing some improvement. She then uses small tools to manipulate bolts and screws with some effort to tighten and loosen.    10/05/21: She starts with tendon glides and wrist AROM in fluidotherapy for 5 mins, followed by push/pull activity on UBE (does 6 mins $Remo'@45'IoqdG$ + PRM and up to 4.5 resistance), farmer's carry with 10# weights for proprioceptive input to help regulate nerves/sensation (similar to stress loading/CRPS program); 4 x 18ft. She then uses FMS pegs  and manipulatives at the table for funl activity and use of hands.  She is tired and sore at end, but no burning or sharp nerve pain.   10/03/21: She starts with tendon glides in fluidotherapy for desensitization and activity for 5 mins. OT then does manual stretches and IASTM through wrist and FA to aide motion and mobility. She is flinching in pain and wants to look away during this and even light touch and she is encouraged to do desensitization at home 4-6 x day as asked, because she really shouldn't be this hypersensitive after 2 weeks of progressive desensitization, if she's been consistent. She then uses UBE for cardio and fnl arm use (push/pull), to help with avoidance of hand/arm use. (Does 6 mins $Remo'@45'lHwuH$ + PRM and up to 4.0 resistance.) She uses pink t-putty for review and to pick out (FMS) small pieces from the putty. She then reviews/performs pinches "duck mouth" finger ext "pizza." She seems somewhat unfamiliar with these (likely not doing 3-4 x day), and is asked to do more often at home  09/28/21: She does wrist AROM for exercise review and new measures which are better in total at wrist now. She then does 6 mins of wrist motion, finger tendon glides, etc. within fluidotherapy for continued desensitization. OT then does fairly strong finger intrinsic stretches and composite flexion stretches (fingers touch palm) and wrist stretches in flex and ext. She then does 2 mins vibration for continued neuro desensitization. OT edu on using metal spoon and contrast bathes to work on hypersensitivity at home as well, and she's given written instructions. She does fnl activities manipulating a pen (in-hand manipulations: rotation, shift, translation) and Velcro board for dynamic pinching, twisting, turning of wrist.  OT does some manual scar mobilization which she doesn't tolerate well, exclaiming out in pain a few times, but OT feels this treatment is necessary. OT also applies k-tape in start pattern over painful  ulnar, proximal area of volar wrist where she is most painful/sensitive. She states not feeling very  badly by the end of treatment today, which is encouraging after she had been having some overt pain signs. OT encourages her to go home and do contrast bath and rest up before doing HEP again today 2 or 3 more times.      PATIENT EDUCATION: Education details: See tx section above for details  Person educated: Patient Education method: Verbal Instruction, Teach back, Handouts  Education comprehension: States and demonstrates understanding, Additional Education required      HOME EXERCISE PROGRAM: Access Code: [email or web address] URL: [email or web address] Prepared by: Benito Mccreedy     GOALS: Goals reviewed with patient? Yes     SHORT TERM GOALS: (STG required if POC>30 days)   Pt will obtain protective, custom orthotic. Target date: TBD as needed Goal status: MET   2.  Pt will demo/state understanding of initial HEP to improve pain levels and prerequisite motion. Target date: 09/29/21 Goal status: INITIAL     LONG TERM GOALS:   Pt will improve functional ability by decreased impairment per PSFS assessment from 3 to 8 or better, for better quality of life. Target date: 11/03/21 Goal status: INITIAL   2.  Pt will improve grip strength in b/l hands to at least 45lbs with no pain for functional use at home and in IADLs. Target date: 11/03/21 Goal status: INITIAL   3.  Pt will improve A/ROM in b/l wrist flexion to at least 60* each, to have functional motion for tasks like reach and grasp.  Target date: 11/03/21 Goal status: INITIAL   4.  Pt will improve strength in left wrist flexion from painful 3+/5 MMT to at least 4+/5 MMT to have increased functional ability to carry out selfcare and higher-level homecare tasks with no difficulty. Target date: 11/03/21 Goal status: INITIAL     ASSESSMENT:   CLINICAL IMPRESSION: 10/11/21: Unfortunately her areas of sensitivity seem  to have changed, more sore in thumb now and distal scar. Strange for it to move locations. Also tighter measures today and slower FMS may indicate lack of HEP performance at home or fear to do her best in clinic due to pain perception. We need to get back to harder activities, stronger stretches and progressive desensitization as these thing appear lacking as of now.   10/05/21: She continues to improve activity and tolerance to touch, etc.   10/03/21: Continue to progress tolerance to touch, motion, strengt in whole arm as she states fatigue and soreness in left arm after UBE today.    PLAN: OT FREQUENCY: 2x/week (tapering as medically indicated)    OT DURATION: 6 weeks (through 11/03/21)   PLANNED INTERVENTIONS: self care/ADL training, therapeutic exercise, therapeutic activity, neuromuscular re-education, manual therapy, scar mobilization, passive range of motion, splinting, electrical stimulation, ultrasound, fluidotherapy, compression bandaging, moist heat, cryotherapy, contrast bath, patient/family education, coping strategies training, and DME and/or AE instructions   RECOMMENDED OTHER SERVICES: none now    CONSULTED AND AGREED WITH PLAN OF CARE: Patient   PLAN FOR NEXT SESSION:  Get back to harder activities, stronger stretches and progressive desensitization as these thing appear lacking as of now. (whole arm strength, etc.as well)   Benito Mccreedy, OTR/L, CHT 10/11/2021, 2:21 PM

## 2021-10-11 NOTE — Telephone Encounter (Signed)
Patient was here for OT. She needs a note for work to be out of work. Her call back number is [phone]. Would like the note to be put in her mychart.

## 2021-10-12 NOTE — Telephone Encounter (Signed)
Please advise...please put in mychart

## 2021-10-13 ENCOUNTER — Encounter: Payer: Self-pay | Admitting: Radiology

## 2021-10-13 ENCOUNTER — Encounter: Payer: Self-pay | Admitting: Rehabilitative and Restorative Service Providers"

## 2021-10-13 ENCOUNTER — Ambulatory Visit (INDEPENDENT_AMBULATORY_CARE_PROVIDER_SITE_OTHER): Payer: 59 | Admitting: Rehabilitative and Restorative Service Providers"

## 2021-10-13 DIAGNOSIS — R600 Localized edema: Secondary | ICD-10-CM

## 2021-10-13 DIAGNOSIS — M25632 Stiffness of left wrist, not elsewhere classified: Secondary | ICD-10-CM | POA: Diagnosis not present

## 2021-10-13 DIAGNOSIS — M25532 Pain in left wrist: Secondary | ICD-10-CM | POA: Diagnosis not present

## 2021-10-13 DIAGNOSIS — M6281 Muscle weakness (generalized): Secondary | ICD-10-CM | POA: Diagnosis not present

## 2021-10-13 DIAGNOSIS — M25631 Stiffness of right wrist, not elsewhere classified: Secondary | ICD-10-CM

## 2021-10-13 NOTE — Therapy (Signed)
OUTPATIENT OCCUPATIONAL THERAPY TREATMENT NOTE   Patient Name: Crystal Hammond MRN: [id number] DOB:Sep 26, 1980, 41 y.o., female Today's Date: 10/13/2021  PCP: Dr. Elsie Stain REFERRING PROVIDER: Dr. 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)   Heart murmur    OUTGREW   History of febrile seizure    History of kidney stones    History of pneumonia    Migraines    OCD (obsessive compulsive disorder)    Oral herpes    Pneumonia    PONV (postoperative nausea and vomiting)    Postpartum depression    Pre-diabetes    Psoriasis    PTSD (post-traumatic stress disorder)    Recurrent UTI    Rosacea    Sleep apnea    Past Surgical History:  Procedure Laterality Date   ABDOMINAL SURGERY  2012   Endometrial Surgery - went through c-section scar   CARPAL TUNNEL RELEASE Right 08/07/2021   Procedure: RIGHT CARPAL TUNNEL RELEASE;  Surgeon: Sherilyn Cooter, MD;  Location: [place];  Service: Orthopedics;  Laterality: Right;   CARPAL TUNNEL RELEASE Left 08/30/2021   Procedure: LEFT CARPAL TUNNEL RELEASE;  Surgeon: Sherilyn Cooter, MD;  Location: [place];  Service: Orthopedics;  Laterality: Left;   CESAREAN SECTION  2019   CESAREAN SECTION  2007   CESAREAN SECTION  2014   CESAREAN SECTION  2016   ENDOMETRIAL BIOPSY  2011   GASTROSCHISIS CLOSURE     LEEP     x 2    VENTRAL HERNIA REPAIR N/A 12/11/2019   Procedure: HERNIA REPAIR VENTRAL ADULT;  Surgeon: Herbert Pun, MD;  Location: [facility];  Service: General;  Laterality: N/A;   WISDOM TOOTH EXTRACTION     XI ROBOTIC ASSISTED VENTRAL HERNIA N/A 12/11/2019   Procedure: XI ROBOTIC ASSISTED VENTRAL HERNIA Lap vs open;  Surgeon: Herbert Pun, MD;  Location: [facility];  Service: General;  Laterality: N/A;   Patient Active Problem List   Diagnosis Date Noted   Carpal tunnel syndrome, left upper limb    Carpal tunnel syndrome, right upper limb    Bilateral carpal tunnel syndrome 07/31/2021   Advance care planning 07/09/2021   Carpal tunnel syndrome 07/09/2021   OSA (obstructive sleep apnea) 07/09/2021   SOBOE (shortness of breath on exertion) 03/19/2021   Tachycardia 03/19/2021   Abdominal pain 02/21/2021   Routine general medical examination at a health care facility 01/15/2021   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   Itching 07/22/2020   Vitamin D deficiency 01/26/2020   Incisional hernia 12/11/2019   Prediabetes 07/26/2019   GAD (generalized anxiety disorder) 06/25/2019   Adult ADHD 06/25/2019   Depressive disorder 06/25/2019   Family history of congenital anomalies 05/27/2017  Herpes 05/27/2017   Hx of cesarean section 05/27/2017   Migraine with aura 05/27/2017   Agoraphobia 05/30/2016   History of abnormal cervical Pap smear 01/31/2016   History of kidney stones 01/31/2016    ONSET DATE: Lt DOS: 08/30/21, Rt DOS: 08/07/21   REFERRING DIAG: G56.02 (ICD-10-CM) - Carpal tunnel syndrome of left wrist  THERAPY DIAG:  Pain in left wrist  Muscle weakness (generalized)  Stiffness of right wrist, not elsewhere classified  Stiffness of left wrist, not elsewhere classified  Localized edema  Rationale for Evaluation and Treatment Rehabilitation  PERTINENT HISTORY: Per MD: "Bilateral carpal tunnel releases.  Describes "tightness" in palm.  Would like to work on stretching,  strengthening to get her ready to return to work."   PRECAUTIONS: 6+ weeks s/p Lt CTR, 6 weeks s/p Rt CTR; A/PROM ok now, wait until 4-6 weeks for light hand strength; wait 3-4 months for full,unrestricted use   WEIGHT BEARING RESTRICTIONS WBAT, avoid painful activities or heavy, repetitive activities 1 more month    SUBJECTIVE:  She states doing better, now wearing watch. She does state that she's getting separated from her husband and that's been stressful. She still feels "hard piece" in sx area, but sensitivity is better now.    PAIN:  Are you having pain?  Yes- burning, soreness  Rating: 2-3/10 at rest now    OBJECTIVE: (All objective assessments below are from initial evaluation on: 09/20/21 unless otherwise specified.)   HAND DOMINANCE: Right    ADLs: Overall ADLs: States decreased ability to grab, hold household objects, pain and inability to pen containers, perform all FMS tasks, etc.      FUNCTIONAL OUTCOME MEASURES: Eval: Patient Specific Functional Scale: 3 (open jars, lifting milk carton, pull up her child's pants)   UPPER EXTREMITY ROM                Eval: right hand opposition to SF 33m gap; 1cm gap in left hand opposition    Active ROM Right eval Left eval Left  09/26/21 Left 09/28/21 Left 10/11/21  Wrist flexion 47 19 62 50 54  Wrist extension 60 51 47 65 32  (Blank rows = not tested)     UPPER EXTREMITY MMT:      MMT Right eval Left eval  Wrist flexion 4-/5 pain 4+/5  Wrist extension 5/5 3+/5 pain  (Blank rows = not tested)   HAND FUNCTION: 09/26/21: Grip strength Left:  17.8 lbs, Left Palmar Pinch: 4 lbs  (10# right)    Eval: Grip strength Right: 43 lbs, Left: TBD lbs (left not tolerated today)    COORDINATION: 10/11/21: 9 Hole Peg Test Right: 18.5sec, Left: 23 sec  Eval: 9 Hole Peg Test Right: 22.3sec, Left: 21.9 sec   SENSATION: Eval: 459mS2PD in right IF, 14m51m2PD in left IF; light touch intact today, though she feels diminished in left  hand   EDEMA:             Eval: Mildly swollen in left hand and wrist today compared to right hand    COGNITION: Eval: Overall cognitive status: WFL for evaluation today, though she was anxious at times   OBSERVATIONS:           09/28/21: She states area of most pain is ulnar proximal wrist crease (volarly) and almost directly over hook of hamate area.    Eval: She has overt hypersensitivity (flinching and guarding in pain) in left sx area to light touch and c/o allodynia. Slightly  red and swollen there as well. Rt only mildly TTP now. Some mild scar adhesions in right CTR noted with wrist and finger motion and some in left CTR as well.      TODAY'S TREATMENT:  10/13/21: OT starts with re-edu on self-care and management of hypersensitivity which is vastly improving now. (She tolerates wearing watch now and rubbing palms on pants.) Next wrist OT supervises her through wrist stretches, nerve glides, then she does 7 mins _0 + PRM and up to 5.0 resistance on UBE for fnl endurance and tolerance, showing big improvement now. She also does 5# b/l biceps curls x20 and then quick muscle/reflexive, fnl activities: catch/throw x30 and functional reaching from floor to overhead b/l x15 for fnl endurance and active nerve gliding through arms. She states getting somewhat irritated in palm by end and is encouraged to go home and rest and keep on HEP.   10/11/21: She starts with 6 mins fluidotherapy for desensitization and AROM in heat, tolerates without problems now. She then does FMS 9HPT and motions at wrist for measurement check. Motion in ext is much tighter today and she was reminded to focus on ext stretches, also FMS slightly worse today despite lower pain complaint. OT will encourage more FMS skills.  She uses vibration which is strangely more hypersensitive in distal scar and less proximally now (this is opposite of what it has been).  OT encourages her to keep on light touch/progressive desensitizations 4-6  x day now. OT then does manual stretches to tight thumb and palm /wrist today. She reviews FMS in-hand manipulations, dropping pen frequently. She is also able to translate a quarter from her palm to fingertips today, showing some improvement. She then uses small tools to manipulate bolts and screws with some effort to tighten and loosen.    10/05/21: She starts with tendon glides and wrist AROM in fluidotherapy for 5 mins, followed by push/pull activity on UBE (does 6 mins _1 + PRM and up to 4.5 resistance), farmer's carry with 10# weights for proprioceptive input to help regulate nerves/sensation (similar to stress loading/CRPS program); 4 x 31f. She then uses FMS pegs and manipulatives at the table for funl activity and use of hands.  She is tired and sore at end, but no burning or sharp nerve pain.     PATIENT EDUCATION: Education details: See tx section above for details  Person educated: Patient Education method: Verbal Instruction, Teach back, Handouts  Education comprehension: States and demonstrates understanding, Additional Education required      HOME EXERCISE PROGRAM: Access Code: [email or web address]: [email or web address] Prepared by: NBenito Mccreedy    GOALS: Goals reviewed with patient? Yes     SHORT TERM GOALS: (STG required if POC>30 days)   Pt will obtain protective, custom orthotic. Target date: TBD as needed Goal status: MET   2.  Pt will demo/state understanding of initial HEP to improve pain levels and prerequisite motion. Target date: 09/29/21 Goal status: INITIAL     LONG TERM GOALS:   Pt will improve functional ability by decreased impairment per PSFS assessment from 3 to 8 or better, for better quality of life. Target date: 11/03/21 Goal status: INITIAL   2.  Pt will improve grip strength in b/l hands to at least 45lbs with no pain for functional use at home and in IADLs. Target date: 11/03/21 Goal status: INITIAL   3.  Pt will improve A/ROM  in b/l wrist flexion to at least 60* each, to have functional motion for  tasks like reach and grasp.  Target date: 11/03/21 Goal status: INITIAL   4.  Pt will improve strength in left wrist flexion from painful 3+/5 MMT to at least 4+/5 MMT to have increased functional ability to carry out selfcare and higher-level homecare tasks with no difficulty. Target date: 11/03/21 Goal status: INITIAL     ASSESSMENT:   CLINICAL IMPRESSION: 10/13/21: She tolerated activities better with more force, resistance today but still arrears slightly tender and a bit weak. Tx in next 2 weeks will focus on gaining strength while being respectful to remaining (and decreasing) hypersensitivity.   10/11/21: Unfortunately her areas of sensitivity seem to have changed, more sore in thumb now and distal scar. Strange for it to move locations. Also tighter measures today and slower FMS may indicate lack of HEP performance at home or fear to do her best in clinic due to pain perception. We need to get back to harder activities, stronger stretches and progressive desensitization as these thing appear lacking as of now.   10/05/21: She continues to improve activity and tolerance to touch, etc.   10/03/21: Continue to progress tolerance to touch, motion, strengt in whole arm as she states fatigue and soreness in left arm after UBE today.    PLAN: OT FREQUENCY: 2x/week (tapering as medically indicated)    OT DURATION: 6 weeks (through 11/03/21)   PLANNED INTERVENTIONS: self care/ADL training, therapeutic exercise, therapeutic activity, neuromuscular re-education, manual therapy, scar mobilization, passive range of motion, splinting, electrical stimulation, ultrasound, fluidotherapy, compression bandaging, moist heat, cryotherapy, contrast bath, patient/family education, coping strategies training, and DME and/or AE instructions   RECOMMENDED OTHER SERVICES: none now    CONSULTED AND AGREED WITH PLAN OF CARE: Patient   PLAN  FOR NEXT SESSION:  Work into more strength activities to build tolerance 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    Benito Mccreedy, OTR/L, CHT 10/13/2021, 10:25 AM

## 2021-10-13 NOTE — Telephone Encounter (Signed)
Her note has been sent to her mychart

## 2021-10-15 ENCOUNTER — Other Ambulatory Visit: Payer: Self-pay | Admitting: Psychiatry

## 2021-10-15 ENCOUNTER — Other Ambulatory Visit: Payer: Self-pay | Admitting: Family Medicine

## 2021-10-16 ENCOUNTER — Other Ambulatory Visit: Payer: Self-pay

## 2021-10-16 MED ORDER — PROPRANOLOL HCL 20 MG PO TABS
20.0000 mg | ORAL_TABLET | Freq: Two times a day (BID) | ORAL | 3 refills | Status: DC
Start: 2021-10-16 — End: 2022-06-07
  Filled 2021-10-16: qty 60, 30d supply, fill #0
  Filled 2021-11-22: qty 60, 30d supply, fill #1
  Filled 2022-01-02: qty 60, 30d supply, fill #2
  Filled 2022-01-25: qty 60, 30d supply, fill #3

## 2021-10-16 MED ORDER — NORTRIPTYLINE HCL 25 MG PO CAPS
25.0000 mg | ORAL_CAPSULE | Freq: Every day | ORAL | 3 refills | Status: DC
  Filled 2021-10-16: qty 30, 30d supply, fill #0
  Filled 2021-11-22: qty 30, 30d supply, fill #1
  Filled 2022-01-02: qty 30, 30d supply, fill #2
  Filled 2022-01-25: qty 30, 30d supply, fill #3

## 2021-10-16 NOTE — Telephone Encounter (Signed)
Refill request for amphetamine-dextroamphetamine (ADDERALL XR) 30 MG 24 hr capsule  LOV - 07/07/21 Next OV - not scheduled Last refill - 09/19/21 #30/0

## 2021-10-18 ENCOUNTER — Other Ambulatory Visit: Payer: Self-pay

## 2021-10-18 MED ORDER — AMPHETAMINE-DEXTROAMPHET ER 30 MG PO CP24
ORAL_CAPSULE | ORAL | 0 refills | Status: DC
Start: 2021-10-18 — End: 2021-11-26
  Filled 2021-10-18: qty 30, 30d supply, fill #0

## 2021-10-19 ENCOUNTER — Encounter: Payer: Self-pay | Admitting: Rehabilitative and Restorative Service Providers"

## 2021-10-19 ENCOUNTER — Ambulatory Visit: Payer: 59 | Admitting: Rehabilitative and Restorative Service Providers"

## 2021-10-19 DIAGNOSIS — M6281 Muscle weakness (generalized): Secondary | ICD-10-CM | POA: Diagnosis not present

## 2021-10-19 DIAGNOSIS — M25632 Stiffness of left wrist, not elsewhere classified: Secondary | ICD-10-CM

## 2021-10-19 DIAGNOSIS — R600 Localized edema: Secondary | ICD-10-CM | POA: Diagnosis not present

## 2021-10-19 DIAGNOSIS — M25532 Pain in left wrist: Secondary | ICD-10-CM

## 2021-10-19 DIAGNOSIS — M25631 Stiffness of right wrist, not elsewhere classified: Secondary | ICD-10-CM

## 2021-10-19 NOTE — Therapy (Signed)
OUTPATIENT OCCUPATIONAL THERAPY TREATMENT NOTE   Patient Name: Crystal Hammond MRN: [id number] DOB:07-03-1980, 41 y.o., female Today's Date: 10/19/2021  PCP: Dr. Elsie Stain REFERRING PROVIDER: Dr. Sherilyn Cooter   END OF SESSION:   OT End of Session - 10/19/21 0804     Visit Number 8    Number of Visits 10    Date for OT Re-Evaluation 11/03/21    Authorization Type [facility]    OT Start Time 0804    OT Stop Time 0844    OT Time Calculation (min) 40 min    Activity Tolerance Patient tolerated treatment well;No increased pain;Patient limited by fatigue;Patient limited by pain    Behavior During Therapy Anxious              Past Medical History:  Diagnosis Date   Anxiety    Cervical dysplasia    Complication of anesthesia    LOW BLOOD PRESSURE   Depression    Endometriosis    Gastroschisis 03-09-1981 (birth)   Heart murmur    OUTGREW   History of febrile seizure    History of kidney stones    History of pneumonia    Migraines    OCD (obsessive compulsive disorder)    Oral herpes    Pneumonia    PONV (postoperative nausea and vomiting)    Postpartum depression    Pre-diabetes    Psoriasis    PTSD (post-traumatic stress disorder)    Recurrent UTI    Rosacea    Sleep apnea    Past Surgical History:  Procedure Laterality Date   ABDOMINAL SURGERY  2012   Endometrial Surgery - went through c-section scar   CARPAL TUNNEL RELEASE Right 08/07/2021   Procedure: RIGHT CARPAL TUNNEL RELEASE;  Surgeon: Sherilyn Cooter, MD;  Location: [place];  Service: Orthopedics;  Laterality: Right;   CARPAL TUNNEL RELEASE Left 08/30/2021   Procedure: LEFT CARPAL TUNNEL RELEASE;  Surgeon: Sherilyn Cooter, MD;  Location: [place];  Service: Orthopedics;  Laterality: Left;   CESAREAN SECTION  2019   CESAREAN SECTION  2007   CESAREAN SECTION  2014   CESAREAN SECTION  2016   ENDOMETRIAL BIOPSY  2011   GASTROSCHISIS CLOSURE     LEEP     x 2    VENTRAL HERNIA REPAIR N/A 12/11/2019   Procedure: HERNIA REPAIR VENTRAL ADULT;  Surgeon: Herbert Pun, MD;  Location: [facility];  Service: General;  Laterality: N/A;   WISDOM TOOTH EXTRACTION     XI ROBOTIC ASSISTED VENTRAL HERNIA N/A 12/11/2019   Procedure: XI ROBOTIC ASSISTED VENTRAL HERNIA Lap vs open;  Surgeon: Herbert Pun, MD;  Location: [facility];  Service: General;  Laterality: N/A;   Patient Active Problem List   Diagnosis Date Noted   Carpal tunnel syndrome, left upper limb    Carpal tunnel syndrome, right upper limb    Bilateral carpal tunnel syndrome 07/31/2021   Advance care planning 07/09/2021   Carpal tunnel syndrome 07/09/2021   OSA (obstructive sleep apnea) 07/09/2021   SOBOE (shortness of breath on exertion) 03/19/2021   Tachycardia 03/19/2021   Abdominal pain 02/21/2021   Routine general medical examination at a health care facility 01/15/2021   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   Itching 07/22/2020   Vitamin D deficiency 01/26/2020   Incisional hernia 12/11/2019   Prediabetes 07/26/2019   GAD (generalized anxiety disorder) 06/25/2019   Adult ADHD 06/25/2019   Depressive disorder 06/25/2019   Family history of congenital anomalies  05/27/2017   Herpes 05/27/2017   Hx of cesarean section 05/27/2017   Migraine with aura 05/27/2017   Agoraphobia 05/30/2016   History of abnormal cervical Pap smear 01/31/2016   History of kidney stones 01/31/2016    ONSET DATE: Lt DOS: 08/30/21, Rt DOS: 08/07/21   REFERRING DIAG: G56.02 (ICD-10-CM) - Carpal tunnel syndrome of left wrist  THERAPY DIAG:  Pain in left wrist  Muscle weakness (generalized)  Stiffness of right wrist, not elsewhere classified  Localized edema  Stiffness of left wrist, not elsewhere classified  Rationale for Evaluation and Treatment Rehabilitation  PERTINENT HISTORY: Per MD: "Bilateral carpal tunnel releases.  Describes "tightness" in palm.  Would like to work on stretching,  strengthening to get her ready to return to work."   PRECAUTIONS: 7 weeks s/p Lt CTR, 6 weeks s/p Rt CTR; A/PROM ok now, wait until 4-6 weeks for light hand strength; wait 3-4 months for full,unrestricted use   WEIGHT BEARING RESTRICTIONS WBAT, avoid painful activities or heavy, repetitive activities 1 more month    SUBJECTIVE:  She states doing well and that she usually "warms up" to exercises with desensitization exercises.    PAIN:  Are you having pain? Yes but only mild  Rating: 2-3/10 at rest now    OBJECTIVE: (All objective assessments below are from initial evaluation on: 09/20/21 unless otherwise specified.)   HAND DOMINANCE: Right    ADLs: Overall ADLs: States decreased ability to grab, hold household objects, pain and inability to pen containers, perform all FMS tasks, etc.      FUNCTIONAL OUTCOME MEASURES: Eval: Patient Specific Functional Scale: 3 (open jars, lifting milk carton, pull up her child's pants)   UPPER EXTREMITY ROM                Eval: right hand opposition to SF 73m gap; 1cm gap in left hand opposition    Active ROM Right eval Left eval Left  09/26/21 Left 09/28/21 Left 10/11/21  Wrist flexion 47 19 62 50 54  Wrist extension 60 51 47 65 32  (Blank rows = not tested)     UPPER EXTREMITY MMT:      MMT Right eval Left eval  Wrist flexion 4-/5 pain 4+/5  Wrist extension 5/5 3+/5 pain  (Blank rows = not tested)   HAND FUNCTION: 09/26/21: Grip strength Left:  17.8 lbs, Left Palmar Pinch: 4 lbs  (10# right)    Eval: Grip strength Right: 43 lbs, Left: TBD lbs (left not tolerated today)    COORDINATION: 10/11/21: 9 Hole Peg Test Right: 18.5sec, Left: 23 sec  Eval: 9 Hole Peg Test Right: 22.3sec, Left: 21.9 sec   SENSATION: Eval: 420mS2PD in right IF, 82m3m2PD in left IF; light touch intact today, though she feels diminished in left hand   EDEMA:             Eval: Mildly swollen in left hand and wrist today compared to right hand     COGNITION: Eval: Overall cognitive status: WFL for evaluation today, though she was anxious at times   OBSERVATIONS:           09/28/21: She states area of most pain is ulnar proximal wrist crease (volarly) and almost directly over hook of hamate area.    Eval: She has overt hypersensitivity (flinching and guarding in pain) in left sx area to light touch and c/o allodynia. Slightly red and swollen there as well. Rt only mildly TTP now. Some mild scar adhesions in  right CTR noted with wrist and finger motion and some in left CTR as well.      TODAY'S TREATMENT:  10/19/21: She starts with vibration/desensitization for 2 mins then gets into the following functional endurance and work simulation type activities:   UBE: 7 mins _0 + PRM and up to 5.0 resistance for fnl endurance  Biceps rope b/l: #20 x15, 25# x10 Triceps rope push b/l: 20# x10, 25# x10  UE Push: 15# 2x15 UE Pull: 25# x15, 45# x10 Catch/throw x30   10/13/21: OT starts with re-edu on self-care and management of hypersensitivity which is vastly improving now. (She tolerates wearing watch now and rubbing palms on pants.) Next wrist OT supervises her through wrist stretches, nerve glides, then she does 7 mins _1 + PRM and up to 5.0 resistance on UBE for fnl endurance and tolerance, showing big improvement now. She also does 5# b/l biceps curls x20 and then quick muscle/reflexive, fnl activities: catch/throw x30 and functional reaching from floor to overhead b/l x15 for fnl endurance and active nerve gliding through arms. She states getting somewhat irritated in palm by end and is encouraged to go home and rest and keep on HEP.   10/11/21: She starts with 6 mins fluidotherapy for desensitization and AROM in heat, tolerates without problems now. She then does FMS 9HPT and motions at wrist for measurement check. Motion in ext is much tighter today and she was reminded to focus on ext stretches, also FMS slightly worse today despite lower pain  complaint. OT will encourage more FMS skills.  She uses vibration which is strangely more hypersensitive in distal scar and less proximally now (this is opposite of what it has been).  OT encourages her to keep on light touch/progressive desensitizations 4-6 x day now. OT then does manual stretches to tight thumb and palm /wrist today. She reviews FMS in-hand manipulations, dropping pen frequently. She is also able to translate a quarter from her palm to fingertips today, showing some improvement. She then uses small tools to manipulate bolts and screws with some effort to tighten and loosen.   PATIENT EDUCATION: Education details: See tx section above for details  Person educated: Patient Education method: Verbal Instruction, Teach back, Handouts  Education comprehension: States and demonstrates understanding, Additional Education required      HOME EXERCISE PROGRAM: Access Code: [email or web address] URL: [email or web address] Prepared by: Benito Mccreedy     GOALS: Goals reviewed with patient? Yes     SHORT TERM GOALS: (STG required if POC>30 days)   Pt will obtain protective, custom orthotic. Target date: TBD as needed Goal status: MET   2.  Pt will demo/state understanding of initial HEP to improve pain levels and prerequisite motion. Target date: 09/29/21 Goal status: INITIAL     LONG TERM GOALS:   Pt will improve functional ability by decreased impairment per PSFS assessment from 3 to 8 or better, for better quality of life. Target date: 11/03/21 Goal status: INITIAL   2.  Pt will improve grip strength in b/l hands to at least 45lbs with no pain for functional use at home and in IADLs. Target date: 11/03/21 Goal status: INITIAL   3.  Pt will improve A/ROM in b/l wrist flexion to at least 60* each, to have functional motion for tasks like reach and grasp.  Target date: 11/03/21 Goal status: INITIAL   4.  Pt will improve strength in left wrist flexion from painful 3+/5  MMT to at least 4+/5 MMT to have increased functional  ability to carry out selfcare and higher-level homecare tasks with no difficulty. Target date: 11/03/21 Goal status: INITIAL     ASSESSMENT:   CLINICAL IMPRESSION: 10/19/21: She is tolerating push/pull now up to 45# (pull) and has less flinching and overt pain signs. She states tight jars still bother her but can't think of very many other difficult home tasks now.    10/13/21: She tolerated activities better with more force, resistance today but still arrears slightly tender and a bit weak. Tx in next 2 weeks will focus on gaining strength while being respectful to remaining (and decreasing) hypersensitivity.   10/11/21: Unfortunately her areas of sensitivity seem to have changed, more sore in thumb now and distal scar. Strange for it to move locations. Also tighter measures today and slower FMS may indicate lack of HEP performance at home or fear to do her best in clinic due to pain perception. We need to get back to harder activities, stronger stretches and progressive desensitization as these thing appear lacking as of now.    PLAN: OT FREQUENCY: 2x/week (tapering as medically indicated)    OT DURATION: 6 weeks (through 11/03/21)   PLANNED INTERVENTIONS: self care/ADL training, therapeutic exercise, therapeutic activity, neuromuscular re-education, manual therapy, scar mobilization, passive range of motion, splinting, electrical stimulation, ultrasound, fluidotherapy, compression bandaging, moist heat, cryotherapy, contrast bath, patient/family education, coping strategies training, and DME and/or AE instructions   RECOMMENDED OTHER SERVICES: none now    CONSULTED AND AGREED WITH PLAN OF CARE: Patient   PLAN FOR NEXT SESSION:  Continue working hard toward reassessment and likely D/C next week.    Benito Mccreedy, OTR/L, CHT 10/19/2021, 8:47 AM

## 2021-10-22 NOTE — Progress Notes (Unsigned)
Synopsis: Referred for dyspnea by [name], MD  Subjective:   PATIENT ID: Crystal Hammond GENDER: female DOB: 1981-03-17, MRN: [id number]  No chief complaint on file.  41yF with history of anxiety, endometriosis, covid-19 infection 11/22/20 for second time who is referred by PCP for dyspnea. She had been vaccinated for covid-19.   Had bout of pneumonia after her second covid-19 infection. Never hospitalized.   She has had several admissions in past for pneumonia, including as a child.  Some suspicion for asthma, started on flovent 1 puff BID - has been on it for a few days. Redid carpeting, isolated pets (dogs, cats) in bottom of house. She doesn't have much of a cough. She does have constant central chest burning sensation, not necessarily with deep breath. ALbuterol seems to be the only medication that's helped so far. No associated throat tightness, hoarseness. She doesn't have overtly symptomatic reflux. She does have some post-nasal drip.  Prednisone worsened her rosacea substantially and she didn't notice if it made any difference with her breathing.   Has had some low oxygen saturation at night when she's checked it (as low as 85-88%).   Weight has been stable over years.   She snores, perhaps loud enough to hear through a door. She doesn't truly seem to have excessive daytime sleepiness. She has had a couple episodes of PND.  Son has OSA, asthma, brother with asthma. 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 She has lived in [place] before - [place], moved to [place] 2017. No recent travel. Smoking 2003-2006, did have significant secondhand smoke exposure. No vaping.   Interval HPI:  Started on resmed airsense 11 autoPAP 5-15   At last visit with Buelah Manis, sniff test ordered (not yet performed) for mild R hemidiaphragm elevation and mild restriction on PFT. Encouraged to dc flovent.  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)   Heart murmur    OUTGREW   History of febrile seizure    History of kidney stones    History of pneumonia    Migraines    OCD (obsessive compulsive disorder)    Oral herpes    Pneumonia    PONV (postoperative nausea and vomiting)    Postpartum depression    Pre-diabetes    Psoriasis    PTSD (post-traumatic stress disorder)    Recurrent UTI    Rosacea    Sleep apnea      Family History  Problem Relation Age of Onset   Depression Mother    Alcohol abuse Mother    Hernia Mother    Rashes / Skin problems Mother    Thyroid disease Mother    Anxiety disorder Mother    Depression Father    Alcohol abuse Father    Gout Father    Diabetes Father    Asthma Brother    Bipolar disorder Brother    Drug abuse Brother    ADD / ADHD Brother    Breast cancer Maternal Aunt    Lung cancer Maternal Aunt    Colon cancer Paternal Grandmother  Allergic rhinitis Son    Asthma Son    ADD / ADHD Son    Allergic rhinitis Son    ADD / ADHD Son    Ovarian cancer Neg Hx      Past Surgical History:  Procedure Laterality Date   ABDOMINAL SURGERY  2012   Endometrial Surgery - went through c-section scar   CARPAL TUNNEL RELEASE Right 08/07/2021   Procedure: RIGHT CARPAL TUNNEL RELEASE;  Surgeon: Marlyne Beards, MD;  Location: [facility];  Service: Orthopedics;  Laterality: Right;   CARPAL TUNNEL RELEASE Left 08/30/2021   Procedure: LEFT CARPAL TUNNEL RELEASE;  Surgeon: Marlyne Beards, MD;  Location: [facility];  Service: Orthopedics;  Laterality: Left;   CESAREAN SECTION  2019   CESAREAN SECTION  2007   CESAREAN SECTION  2014   CESAREAN SECTION  2016    ENDOMETRIAL BIOPSY  2011   GASTROSCHISIS CLOSURE     LEEP     x 2   VENTRAL HERNIA REPAIR N/A 12/11/2019   Procedure: HERNIA REPAIR VENTRAL ADULT;  Surgeon: Carolan Shiver, MD;  Location: [facility];  Service: General;  Laterality: N/A;   WISDOM TOOTH EXTRACTION     XI ROBOTIC ASSISTED VENTRAL HERNIA N/A 12/11/2019   Procedure: XI ROBOTIC ASSISTED VENTRAL HERNIA Lap vs open;  Surgeon: Carolan Shiver, MD;  Location: [facility];  Service: General;  Laterality: N/A;    Social History   Socioeconomic History   Marital status: Married    Spouse name: Dustin   Number of children: 4   Years of education: Not on file   Highest education level: Not on file  Occupational History   Occupation: Curator: [employer]  Tobacco Use   Smoking status: Former    Packs/day: 0.30    Years: 3.00    Total pack years: 0.90    Types: Cigarettes    Quit date: 2013    Years since quitting: 10.5    Passive exposure: Past   Smokeless tobacco: Never  Vaping Use   Vaping Use: Never used  Substance and Sexual Activity   Alcohol use: Yes    Alcohol/week: 2.0 standard drinks of alcohol    Types: 2 Cans of beer per week   Drug use: Never   Sexual activity: Yes    Birth control/protection: None  Other Topics Concern   Not on file  Social History Narrative   Three sons and one daughter. Second oldest has autism spectrum disorder and two oldest ADHD.   Right handed   Caffeine- 1 cup per day    Social Determinants of Health   Financial Resource Strain: Not on file  Food Insecurity: Not on file  Transportation Needs: Not on file  Physical Activity: Not on file  Stress: Not on file  Social Connections: Not on file  Intimate Partner Violence: Not on file     Allergies  Allergen Reactions   Other Swelling    SHELLFISH    Phenazopyridine Hives and Rash   Benadryl [Diphenhydramine]    Prednisone     Intolerant- skin feels hot   Zomig [Zolmitriptan]     Diffuse aches.        Outpatient Medications Prior to Visit  Medication Sig Dispense Refill   albuterol (VENTOLIN HFA) 108 (90 Base) MCG/ACT inhaler Inhale 2 puffs into the lungs every 6 (six) hours as needed for wheezing or shortness of breath. 18 g 0   amphetamine-dextroamphetamine (ADDERALL XR) 30  MG 24 hr capsule Take 1 capsule (30 mg total) by mouth daily. 30 capsule 0   amphetamine-dextroamphetamine (ADDERALL) 10 MG tablet Take 1 tablet (10 mg total) by mouth daily. 30 tablet 0   fluticasone (FLOVENT HFA) 110 MCG/ACT inhaler Inhale 1 puff into the lungs in the morning and at bedtime. 12 g 12   nortriptyline (PAMELOR) 25 MG capsule Take 1 capsule (25 mg total) by mouth at bedtime. 30 capsule 3   propranolol (INDERAL) 20 MG tablet Take 1 tablet (20 mg total) by mouth 2 (two) times daily. 60 tablet 3   No facility-administered medications prior to visit.       Objective:   Physical Exam:  General appearance: 41 y.o., female, NAD, conversant  Eyes: anicteric sclerae; PERRL, tracking appropriately HENT: NCAT; MMM Neck: Trachea midline; no lymphadenopathy, no JVD Lungs: CTAB, no crackles, no wheeze, with normal respiratory effort CV: RRR, no murmur  Abdomen: Soft, non-tender; non-distended, BS present  Extremities: No peripheral edema, warm Skin: Normal turgor and texture; no rash Psych: Appropriate affect Neuro: Alert and oriented to person and place, no focal deficit     There were no vitals filed for this visit.    on RA BMI Readings from Last 3 Encounters:  08/30/21 36.60 kg/m  08/07/21 36.73 kg/m  07/31/21 36.95 kg/m   Wt Readings from Last 3 Encounters:  08/30/21 187 lb 6.3 oz (85 kg)  08/07/21 188 lb 0.8 oz (85.3 kg)  07/31/21 189 lb 3.2 oz (85.8 kg)     CBC    Component Value Date/Time   WBC 8.5 02/21/2021 0904   RBC 4.47 02/21/2021 0904   HGB 11.9 (L) 02/21/2021 0904   HCT 36.5 02/21/2021 0904   PLT 308.0 02/21/2021 0904   MCV 81.6 02/21/2021 0904   MCH 26.8  12/08/2020 1629   MCHC 32.5 02/21/2021 0904   RDW 14.4 02/21/2021 0904   LYMPHSABS 2.5 02/21/2021 0904   MONOABS 0.5 02/21/2021 0904   EOSABS 0.3 02/21/2021 0904   BASOSABS 0.0 02/21/2021 0904    Eos 300  12/15/20 BNP, d-dimer WNL  Chest Imaging:  CXR 03/14/21 reviewed by me unremarkable  CT stone study 12/18/19 lung bases reviewed by me unremarkable  Pulmonary Functions Testing Results:    Latest Ref Rng & Units 04/19/2021   10:50 AM  PFT Results  FVC-Pre L 2.46   FVC-Predicted Pre % 75   FVC-Post L 2.43   FVC-Predicted Post % 75   Pre FEV1/FVC % % 86   Post FEV1/FCV % % 88   FEV1-Pre L 2.11   FEV1-Predicted Pre % 78   FEV1-Post L 2.15   DLCO uncorrected ml/min/mmHg 17.44   DLCO UNC% % 91   DLCO corrected ml/min/mmHg 18.35   DLCO COR %Predicted % 95   DLVA Predicted % 113   TLC L 3.57   TLC % Predicted % 80   RV % Predicted % 79        Assessment & Plan:   # DOE Unclear etiology. Covid-19 long haul vs asthma whether related to covid-19 or not vs untreated sleep-disordered breathing, deconditioning.  # Snoring # Paroxysmal nocturnal dyspnea # Nocturnal hypoxia Low-intermediate suspicion for sleep-disordered breathing. She's willing to undergo home sleep study, less willing to do in-lab study. Excessive daytime sleepiness may be masked by adderall use.  # Recurrent pneumonias  Plan: -immunoglobulin levels given recurrent pneumonias, IgE given concern for asthma and pt's concern she may have allergic profile - PFTs next visit -  instructed to hold flovent ideally for 2 weeks and at least 1 week prior to PFTs, albuterol for at least a couple days beforehand - home sleep study - ok to continue flovent rinsing mouth afterward, prn albuterol for now   RTC 4 weeks with PFT either myself or APP  I spent 47 minutes dedicated to the care of this patient on the date of this encounter to include pre-visit review of records, face-to-face time with the patient discussing  conditions above, post visit ordering of testing, clinical documentation with the electronic health record, making appropriate referrals as documented, and communicating necessary findings to members of the patients care team.    Omar Person, MD [facility] 10/22/2021 3:04 PM

## 2021-10-23 ENCOUNTER — Encounter: Payer: Self-pay | Admitting: Rehabilitative and Restorative Service Providers"

## 2021-10-23 ENCOUNTER — Ambulatory Visit (INDEPENDENT_AMBULATORY_CARE_PROVIDER_SITE_OTHER): Payer: 59 | Admitting: Rehabilitative and Restorative Service Providers"

## 2021-10-23 DIAGNOSIS — M25632 Stiffness of left wrist, not elsewhere classified: Secondary | ICD-10-CM | POA: Diagnosis not present

## 2021-10-23 DIAGNOSIS — M6281 Muscle weakness (generalized): Secondary | ICD-10-CM | POA: Diagnosis not present

## 2021-10-23 DIAGNOSIS — R600 Localized edema: Secondary | ICD-10-CM

## 2021-10-23 DIAGNOSIS — M25532 Pain in left wrist: Secondary | ICD-10-CM

## 2021-10-23 DIAGNOSIS — M25631 Stiffness of right wrist, not elsewhere classified: Secondary | ICD-10-CM | POA: Diagnosis not present

## 2021-10-23 NOTE — Therapy (Signed)
OUTPATIENT OCCUPATIONAL THERAPY TREATMENT NOTE   Patient Name: Crystal Hammond MRN: [id number] DOB:[birth year], 41 y.o., female Today's Date: 10/23/2021  PCP: Dr. Elsie Stain REFERRING PROVIDER: Dr. 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)   Heart murmur    OUTGREW   History of febrile seizure    History of kidney stones    History of pneumonia    Migraines    OCD (obsessive compulsive disorder)    Oral herpes    Pneumonia    PONV (postoperative nausea and vomiting)    Postpartum depression    Pre-diabetes    Psoriasis    PTSD (post-traumatic stress disorder)    Recurrent UTI    Rosacea    Sleep apnea    Past Surgical History:  Procedure Laterality Date   ABDOMINAL SURGERY  2012   Endometrial Surgery - went through c-section scar   CARPAL TUNNEL RELEASE Right 08/07/2021   Procedure: RIGHT CARPAL TUNNEL RELEASE;  Surgeon: Sherilyn Cooter, MD;  Location: [facility];  Service: Orthopedics;  Laterality: Right;   CARPAL TUNNEL RELEASE Left 08/30/2021   Procedure: LEFT CARPAL TUNNEL RELEASE;  Surgeon: Sherilyn Cooter, MD;  Location: [facility];  Service: Orthopedics;  Laterality: Left;   CESAREAN SECTION  2019   CESAREAN SECTION  2007   CESAREAN SECTION  2014   CESAREAN SECTION  2016   ENDOMETRIAL BIOPSY  2011   GASTROSCHISIS CLOSURE     LEEP     x 2    VENTRAL HERNIA REPAIR N/A 12/11/2019   Procedure: HERNIA REPAIR VENTRAL ADULT;  Surgeon: Herbert Pun, MD;  Location: [facility];  Service: General;  Laterality: N/A;   WISDOM TOOTH EXTRACTION     XI ROBOTIC ASSISTED VENTRAL HERNIA N/A 12/11/2019   Procedure: XI ROBOTIC ASSISTED VENTRAL HERNIA Lap vs open;  Surgeon: Herbert Pun, MD;  Location: [facility];  Service: General;  Laterality: N/A;   Patient Active Problem List   Diagnosis Date Noted   Carpal tunnel syndrome, left upper limb    Carpal tunnel syndrome, right upper limb    Bilateral carpal tunnel syndrome 07/31/2021   Advance care planning 07/09/2021   Carpal tunnel syndrome 07/09/2021   OSA (obstructive sleep apnea) 07/09/2021   SOBOE (shortness of breath on exertion) 03/19/2021   Tachycardia 03/19/2021   Abdominal pain 02/21/2021   Routine general medical examination at a health care facility 01/15/2021   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   Itching 07/22/2020   Vitamin D deficiency 01/26/2020   Incisional hernia 12/11/2019   Prediabetes 07/26/2019   GAD (generalized anxiety disorder) 06/25/2019   Adult ADHD 06/25/2019   Depressive disorder 06/25/2019   Family history of congenital  anomalies 05/27/2017   Herpes 05/27/2017   Hx of cesarean section 05/27/2017   Migraine with aura 05/27/2017   Agoraphobia 05/30/2016   History of abnormal cervical Pap smear 01/31/2016   History of kidney stones 01/31/2016    ONSET DATE: Lt DOS: 08/30/21, Rt DOS: 08/07/21   REFERRING DIAG: G56.02 (ICD-10-CM) - Carpal tunnel syndrome of left wrist  THERAPY DIAG:  Pain in left wrist  Muscle weakness (generalized)  Stiffness of right wrist, not elsewhere classified  Stiffness of left wrist, not elsewhere classified  Localized edema  Rationale for Evaluation and Treatment Rehabilitation  PERTINENT HISTORY: Per MD: "Bilateral carpal tunnel releases.  Describes "tightness" in palm.  Would like to work on stretching,  strengthening to get her ready to return to work."   PRECAUTIONS: 8 weeks s/p Lt CTR, 6 weeks s/p Rt CTR; wait 3-4 total months for full, unrestricted use   WEIGHT BEARING RESTRICTIONS WBAT, avoid painful activities or heavy, repetitive activities 1 more month    SUBJECTIVE:  She states doing well and taking frequent trips to [facility], having no exacerbations   PAIN:  Are you having pain?  Yes but only mild  Rating: 1-2/10 at rest now    OBJECTIVE: (All objective assessments below are from initial evaluation on: 09/20/21 unless otherwise specified.)   HAND DOMINANCE: Right    ADLs: Overall ADLs: States decreased ability to grab, hold household objects, pain and inability to pen containers, perform all FMS tasks, etc.      FUNCTIONAL OUTCOME MEASURES: Eval: Patient Specific Functional Scale: 3 (open jars, lifting milk carton, pull up her child's pants)   UPPER EXTREMITY ROM                Eval: right hand opposition to SF 66m gap; 1cm gap in left hand opposition    Active ROM Right eval Left eval Left  09/26/21 Left 09/28/21 Left 10/11/21  Wrist flexion 47 19 62 50 54  Wrist extension 60 51 47 65 32  (Blank rows = not tested)     UPPER EXTREMITY MMT:      MMT Right eval Left eval  Wrist flexion 4-/5 pain 4+/5  Wrist extension 5/5 3+/5 pain  (Blank rows = not tested)   HAND FUNCTION: 09/26/21: Grip strength Left:  17.8 lbs, Left Palmar Pinch: 4 lbs  (10# right)    Eval: Grip strength Right: 43 lbs, Left: TBD lbs (left not tolerated today)    COORDINATION: 10/23/21: Left: 20.8 sec  10/11/21: 9 Hole Peg Test Right: 18.5sec, Left: 23 sec  Eval: 9 Hole Peg Test Right: 22.3sec, Left: 21.9 sec   SENSATION: Eval: 446mS2PD in right IF, 46m73m2PD in left IF; light touch intact today, though she feels diminished in left hand   EDEMA:             Eval: Mildly swollen in left hand and wrist today compared to right hand    COGNITION: Eval: Overall cognitive status: WFL for  evaluation today, though she was anxious at times   OBSERVATIONS:           09/28/21: She states area of most pain is ulnar proximal wrist crease (volarly) and almost directly over hook of hamate area.    Eval: She has overt hypersensitivity (flinching and guarding in pain) in left sx area to light touch and c/o allodynia. Slightly red and swollen there as well. Rt only mildly TTP now. Some mild scar adhesions in right CTR noted with wrist  and finger motion and some in left CTR as well.      TODAY'S TREATMENT:  10/23/21: OT does manual desensitization and stretches to hand/palm to begin and she tolerates well. OT also discusses and reviews LTGs with her.  She then gets into strengthening activities and exercises as below with no significant inhibiting pains or problems. Working on more resistance and less rest, etc. She also does FMS 9HPT and scores the best she's done so far and WNL.   UBE: 7 mins _0 + PRM and up to 5.0 resistance for fnl endurance  Biceps rope b/l: #25 x15; 35# x 15 Triceps rope push b/l: 25# x15; 35# x15 UE Push: 35# x15, 45# x15 UE Pull: 45# x15, 55# x15  Farmer's Carry:  Catch/throw x30    10/19/21: She starts with vibration/desensitization for 2 mins then gets into the following functional endurance and work simulation type activities:   UBE: 7 mins _1 + PRM and up to 5.0 resistance for fnl endurance  Biceps rope b/l: #20 x15, 25# x10 Triceps rope push b/l: 20# x10, 25# x10  UE Push: 15# 2x15 UE Pull: 25# x15, 45# x10 Catch/throw x30   PATIENT EDUCATION: Education details: See tx section above for details  Person educated: Patient Education method: Veterinary surgeon, Teach back, Handouts  Education comprehension: States and demonstrates understanding, Additional Education required      HOME EXERCISE PROGRAM: Access Code: [email or web address] URL: [email or web address] Prepared by: Benito Mccreedy     GOALS: Goals reviewed with patient? Yes      SHORT TERM GOALS: (STG required if POC>30 days)   Pt will obtain protective, custom orthotic. Target date: TBD as needed Goal status: N/A, D/C    2.  Pt will demo/state understanding of initial HEP to improve pain levels and prerequisite motion. Target date: 09/29/21 Goal status: 10/23/21 MET     LONG TERM GOALS:   Pt will improve functional ability by decreased impairment per PSFS assessment from 3 to 8 or better, for better quality of life. Target date: 11/03/21 Goal status: INITIAL   2.  Pt will improve grip strength in b/l hands to at least 45lbs with no pain for functional use at home and in IADLs. Target date: 11/03/21 Goal status: INITIAL   3.  Pt will improve A/ROM in b/l wrist flexion to at least 60* each, to have functional motion for tasks like reach and grasp.  Target date: 11/03/21 Goal status: INITIAL   4.  Pt will improve strength in left wrist flexion from painful 3+/5 MMT to at least 4+/5 MMT to have increased functional ability to carry out selfcare and higher-level homecare tasks with no difficulty. Target date: 11/03/21 Goal status: INITIAL     ASSESSMENT:   CLINICAL IMPRESSION: 10/23/21: She is WNL for FMS, much stronger, not sensitive to touch now, doing repetitive, resistive routines with no complaints now. Likely D/C after next session reassessment.   10/19/21: She is tolerating push/pull now up to 45# (pull) and has less flinching and overt pain signs. She states tight jars still bother her but can't think of very many other difficult home tasks now.    10/13/21: She tolerated activities better with more force, resistance today but still arrears slightly tender and a bit weak. Tx in next 2 weeks will focus on gaining strength while being respectful to remaining (and decreasing) hypersensitivity.   10/11/21: Unfortunately her areas of sensitivity seem to have changed, more sore in thumb now and distal scar. Strange for it to  move locations. Also tighter measures  today and slower FMS may indicate lack of HEP performance at home or fear to do her best in clinic due to pain perception. We need to get back to harder activities, stronger stretches and progressive desensitization as these thing appear lacking as of now.    PLAN: OT FREQUENCY: 2x/week (tapering as medically indicated)    OT DURATION: 6 weeks (through 11/03/21)   PLANNED INTERVENTIONS: self care/ADL training, therapeutic exercise, therapeutic activity, neuromuscular re-education, manual therapy, scar mobilization, passive range of motion, splinting, electrical stimulation, ultrasound, fluidotherapy, compression bandaging, moist heat, cryotherapy, contrast bath, patient/family education, coping strategies training, and DME and/or AE instructions   RECOMMENDED OTHER SERVICES: none now    CONSULTED AND AGREED WITH PLAN OF CARE: Patient   PLAN FOR NEXT SESSION:  Continue working hard toward reassessment and likely D/C next session.    Benito Mccreedy, OTR/L, CHT 10/23/2021, 10:19 AM

## 2021-10-24 ENCOUNTER — Encounter: Payer: Self-pay | Admitting: Student

## 2021-10-24 ENCOUNTER — Ambulatory Visit: Payer: 59 | Admitting: Student

## 2021-10-24 VITALS — BP 124/66 | HR 82 | Temp 98.1°F | Ht 60.0 in | Wt 190.0 lb

## 2021-10-24 DIAGNOSIS — R0609 Other forms of dyspnea: Secondary | ICD-10-CM | POA: Diagnosis not present

## 2021-10-24 DIAGNOSIS — G4733 Obstructive sleep apnea (adult) (pediatric): Secondary | ICD-10-CM | POA: Diagnosis not present

## 2021-10-24 DIAGNOSIS — Z9989 Dependence on other enabling machines and devices: Secondary | ICD-10-CM

## 2021-10-24 NOTE — Patient Instructions (Signed)
-   Keep up the good work with your AutoPAP, you're doing great with it! - You will be called to schedule methacholine challenge, stay off flovent before this test - see you in a month or sooner if need be!

## 2021-10-25 ENCOUNTER — Ambulatory Visit (INDEPENDENT_AMBULATORY_CARE_PROVIDER_SITE_OTHER): Payer: 59 | Admitting: Psychologist

## 2021-10-25 ENCOUNTER — Encounter: Payer: Self-pay | Admitting: Rehabilitative and Restorative Service Providers"

## 2021-10-25 ENCOUNTER — Ambulatory Visit (INDEPENDENT_AMBULATORY_CARE_PROVIDER_SITE_OTHER): Payer: 59 | Admitting: Rehabilitative and Restorative Service Providers"

## 2021-10-25 DIAGNOSIS — M25632 Stiffness of left wrist, not elsewhere classified: Secondary | ICD-10-CM | POA: Diagnosis not present

## 2021-10-25 DIAGNOSIS — M25631 Stiffness of right wrist, not elsewhere classified: Secondary | ICD-10-CM

## 2021-10-25 DIAGNOSIS — F4310 Post-traumatic stress disorder, unspecified: Secondary | ICD-10-CM

## 2021-10-25 DIAGNOSIS — Z634 Disappearance and death of family member: Secondary | ICD-10-CM

## 2021-10-25 DIAGNOSIS — R600 Localized edema: Secondary | ICD-10-CM

## 2021-10-25 DIAGNOSIS — F331 Major depressive disorder, recurrent, moderate: Secondary | ICD-10-CM

## 2021-10-25 DIAGNOSIS — M6281 Muscle weakness (generalized): Secondary | ICD-10-CM

## 2021-10-25 DIAGNOSIS — M25532 Pain in left wrist: Secondary | ICD-10-CM | POA: Diagnosis not present

## 2021-10-25 NOTE — Therapy (Signed)
OUTPATIENT OCCUPATIONAL THERAPY TREATMENT & DISCHARGE NOTE   Patient Name: Crystal Hammond MRN: [id number] DOB:07-Jun-1980, 41 y.o., female Today's Date: 10/25/2021  PCP: Dr. Elsie Stain REFERRING PROVIDER: Dr. 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)   Heart murmur    OUTGREW   History of febrile seizure    History of kidney stones    History of pneumonia    Migraines    OCD (obsessive compulsive disorder)    Oral herpes    Pneumonia    PONV (postoperative nausea and vomiting)    Postpartum depression    Pre-diabetes    Psoriasis    PTSD (post-traumatic stress disorder)    Recurrent UTI    Rosacea    Sleep apnea    Past Surgical History:  Procedure Laterality Date   ABDOMINAL SURGERY  2012   Endometrial Surgery - went through c-section scar   CARPAL TUNNEL RELEASE Right 08/07/2021   Procedure: RIGHT CARPAL TUNNEL RELEASE;  Surgeon: Sherilyn Cooter, MD;  Location: [place];  Service: Orthopedics;  Laterality: Right;   CARPAL TUNNEL RELEASE Left 08/30/2021   Procedure: LEFT CARPAL TUNNEL RELEASE;  Surgeon: Sherilyn Cooter, MD;  Location: [place];  Service: Orthopedics;  Laterality: Left;   CESAREAN SECTION  2019   CESAREAN SECTION  2007   CESAREAN SECTION  2014   CESAREAN SECTION  2016   ENDOMETRIAL BIOPSY  2011   GASTROSCHISIS CLOSURE     LEEP     x 2   VENTRAL  HERNIA REPAIR N/A 12/11/2019   Procedure: HERNIA REPAIR VENTRAL ADULT;  Surgeon: Herbert Pun, MD;  Location: [facility];  Service: General;  Laterality: N/A;   WISDOM TOOTH EXTRACTION     XI ROBOTIC ASSISTED VENTRAL HERNIA N/A 12/11/2019   Procedure: XI ROBOTIC ASSISTED VENTRAL HERNIA Lap vs open;  Surgeon: Herbert Pun, MD;  Location: [facility];  Service: General;  Laterality: N/A;   Patient Active Problem List   Diagnosis Date Noted   Carpal tunnel syndrome, left upper limb    Carpal tunnel syndrome, right upper limb    Bilateral carpal tunnel syndrome 07/31/2021   Advance care planning 07/09/2021   Carpal tunnel syndrome 07/09/2021   OSA (obstructive sleep apnea) 07/09/2021   SOBOE (shortness of breath on exertion) 03/19/2021   Tachycardia 03/19/2021   Abdominal pain 02/21/2021   Routine general medical examination at a health care facility 01/15/2021   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   Itching 07/22/2020   Vitamin D deficiency 01/26/2020   Incisional hernia 12/11/2019   Prediabetes 07/26/2019   GAD (generalized anxiety disorder) 06/25/2019   Adult ADHD 06/25/2019   Depressive disorder 06/25/2019   Family history of congenital  anomalies 05/27/2017   Herpes 05/27/2017   Hx of cesarean section 05/27/2017   Migraine with aura 05/27/2017   Agoraphobia 05/30/2016   History of abnormal cervical Pap smear 01/31/2016   History of kidney stones 01/31/2016    ONSET DATE: Lt DOS: 08/30/21, Rt DOS: 08/07/21   REFERRING DIAG: G56.02 (ICD-10-CM) - Carpal tunnel syndrome of left wrist  THERAPY DIAG:  Muscle weakness (generalized)  Stiffness of right wrist, not elsewhere classified  Localized edema  Stiffness of left wrist, not elsewhere classified  Pain in left wrist  Rationale for Evaluation and Treatment Rehabilitation  PERTINENT HISTORY: Per MD: "Bilateral carpal tunnel releases.  Describes "tightness" in palm.  Would like to work on stretching,  strengthening to get her ready to return to work."   PRECAUTIONS: 8 weeks s/p Lt CTR, 6 weeks s/p Rt CTR; wait 3-4 total months for full, unrestricted use   WEIGHT BEARING RESTRICTIONS WBAT, avoid painful activities or heavy, repetitive activities 1 more month    SUBJECTIVE:  She states doing well with things at home, has HEP if needed, etc.    PAIN:  Are you having pain? No   Rating: 0/10 at rest now    OBJECTIVE: (All objective assessments below are from initial evaluation on: 09/20/21 unless otherwise specified.)   HAND DOMINANCE: Right    ADLs: Overall ADLs: States decreased ability to grab, hold household objects, pain and inability to pen containers, perform all FMS tasks, etc.      FUNCTIONAL OUTCOME MEASURES: Eval: Patient Specific Functional Scale: 3 (open jars, lifting milk carton, pull up her child's pants)   UPPER EXTREMITY ROM                Eval: right hand opposition to SF 20m gap; 1cm gap in left hand opposition    Active ROM Right eval Left eval Left 10/11/21 Left 10/25/21  Wrist flexion 47 19 54 60  Wrist extension 60 51 32 70  (Blank rows = not tested)     UPPER EXTREMITY MMT:      MMT Right eval Left eval Left 10/25/21  Wrist flexion 4-/5 pain 4+/5 5/5  Wrist extension 5/5 3+/5 pain 5/5  (Blank rows = not tested)   HAND FUNCTION: 10/25/21: Grip strength Left:  47 lbs  09/26/21: Grip strength Left:  17.8 lbs, Left Palmar Pinch: 4 lbs  (10# right)    Eval: Grip strength Right: 43 lbs, Left: TBD lbs (left not tolerated today)    COORDINATION: 10/23/21: Left: 20.8 sec  10/11/21: 9 Hole Peg Test Right: 18.5sec, Left: 23 sec  Eval: 9 Hole Peg Test Right: 22.3sec, Left: 21.9 sec   SENSATION: Eval: 440mS2PD in right IF, 54m66m2PD in left IF; light touch intact today, though she feels diminished in left hand   EDEMA:             Eval: Mildly swollen in left hand and wrist today compared to right hand    COGNITION: Eval: Overall cognitive status:  WFL for evaluation today, though she was anxious at times   OBSERVATIONS:         10/25/21: no tingling no overt hypersensitivity now, very strong, etc.     09/28/21: She states area of most pain is ulnar proximal wrist crease (volarly) and almost directly over hook of hamate area.    Eval: She has overt hypersensitivity (flinching and guarding in pain) in left sx area to light touch and c/o allodynia. Slightly red and swollen there  as well. Rt only mildly TTP now. Some mild scar adhesions in right CTR noted with wrist and finger motion and some in left CTR as well.      TODAY'S TREATMENT:  10/25/21: She does strengthening and functional activities as below, also does  patient lifting simulation with no major issues.   UBE: 5 mins _0 + PRM and up to 5.0 resistance for fnl endurance  Biceps rope b/l: 35# x 15 Triceps rope push b/l: 35# x5, 45# x5, 55# x5  UE Push: 35# x5, 45# x15 UE Pull: 55# x15    PATIENT EDUCATION: Education details: See tx section above for details  Person educated: Patient Education method: Verbal Instruction, Teach back, Handouts  Education comprehension: States and demonstrates understanding, Additional Education required      HOME EXERCISE PROGRAM: Access Code: [email or web address] URL: [email or web address] Prepared by: Benito Mccreedy     GOALS: Goals reviewed with patient? Yes     SHORT TERM GOALS: (STG required if POC>30 days)   Pt will obtain protective, custom orthotic. Target date: TBD as needed Goal status: N/A, D/C    2.  Pt will demo/state understanding of initial HEP to improve pain levels and prerequisite motion. Target date: 09/29/21 Goal status: 10/23/21 MET     LONG TERM GOALS:   Pt will improve functional ability by decreased impairment per PSFS assessment from 3 to 8 or better, for better quality of life. Target date: 11/03/21 Goal status: 10/25/21- MET 8.3 now    2.  Pt will improve grip strength in b/l hands to at least 45lbs  with no pain for functional use at home and in IADLs. Target date: 11/03/21 Goal status: 10/25/21: Met    3.  Pt will improve A/ROM in b/l wrist flexion to at least 60* each, to have functional motion for tasks like reach and grasp.  Target date: 11/03/21 Goal status: 10/25/21: Met    4.  Pt will improve strength in left wrist flexion from painful 3+/5 MMT to at least 4+/5 MMT to have increased functional ability to carry out selfcare and higher-level homecare tasks with no difficulty. Target date: 11/03/21 Goal status: 10/25/21: Met      ASSESSMENT:   CLINICAL IMPRESSION: 10/25/21: She has no pain now, fullness of motion, excellent strength and full functional ability now. D/C  PLAN: OT FREQUENCY: D/C now    OT DURATION: D/C now    PLANNED INTERVENTIONS: self care/ADL training, therapeutic exercise, therapeutic activity, neuromuscular re-education, manual therapy, scar mobilization, passive range of motion, splinting, electrical stimulation, ultrasound, fluidotherapy, compression bandaging, moist heat, cryotherapy, contrast bath, patient/family education, coping strategies training, and DME and/or AE instructions   RECOMMENDED OTHER SERVICES: none now    CONSULTED AND AGREED WITH PLAN OF CARE: Patient   PLAN FOR NEXT SESSION:  D/C successfully now   [employer], OTR/L, CHT 10/25/2021, 10:10 AM   OCCUPATIONAL THERAPY DISCHARGE SUMMARY  Visits from Start of Care: 10  Current functional level related to goals / functional outcomes: Pt has met all goals to satisfactory levels and is pleased with outcomes.   Remaining deficits: Pt has no more significant functional deficits or pain.   Education / Equipment: Pt has all needed materials and education. Pt understands how to continue on with self-management. See tx notes for more details.   Patient agrees to discharge due to max benefits received from outpatient occupational therapy / hand therapy at this time.   Benito Mccreedy, OTR/L, CHT 10/25/21

## 2021-10-25 NOTE — Progress Notes (Signed)
[facility] Counselor/Therapist Progress Note  Patient ID: Crystal Hammond, MRN: [id number],    Date: 10/25/2021  Time Spent: 11:05 am to 11:43 am; total time: 38 minutes   This session was held via in person. The patient consented to in-person therapy and was in the clinician's office. Limits of confidentiality were discussed with the patient.  Treatment Type: Individual Therapy  Reported Symptoms: Out of connection with values  Mental Status Exam: Appearance:  Well Groomed     Behavior: Appropriate  Motor: Normal  Speech/Language:  Clear and Coherent  Affect: Appropriate  Mood: normal  Thought process: normal  Thought content:   WNL  Sensory/Perceptual disturbances:   WNL  Orientation: oriented to person, place, and time/date  Attention: Good  Concentration: Good  Memory: WNL  Fund of knowledge:  Good  Insight:   Fair  Judgment:  Fair  Impulse Control: Good   Risk Assessment: Danger to Self:  No Self-injurious Behavior: No Danger to Others: No Duty to Warn:no Physical Aggression / Violence:No  Access to Firearms a concern: No  Gang Involvement:No   Subjective: Beginning the session, patient described herself as doing better and elaborated on what has assisted the patient. Continuing to talk, she voiced some distress over the fact that she does not remember events from the last year of her life. Patient spent some of the session, exploring the etiology of why she can't recall certain memories. From there, the themes of control were discussed and the feeling to need to be in control. At the end of the session, patient developed an insight that she has to care for others to earn love. She processed thoughts and emotions. She asked to follow up. She denied suicidal and homicidal ideation.    Interventions:  Worked on developing a therapeutic relationship with the patient using active listening and reflective statements. Provided emotional support using  empathy and validation. Used summary statements. Praised the patient for doing better. Normalized and validated the expressed thoughts and emotions. Explored what has assisted the patient. Validated concerns related to memory. Used socratic questions to assist the patient. Challenged some of the thoughts expressed. Reflected on the idea that the body is attempting to tell the patient something. Explored what the body may be trying to express to the patient. Explored the idea of whether or not patient needs to respond differently. Processed thoughts and emotions related to control. Assisted the patient gain insight into earning love from others. Assisted in problem solving. Provided empathic statements. Validated expressed thoughts and emotions. Assessed for suicidal and homicidal ideation.   Homework: NA  Next Session: Emotional support. 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   Plan:   Goals Reduce overall frequency, intensity, and duration of trauma related symptoms Stabilize  trauma while increasing ability to function Enhance ability to effectively cope with triggers, reminders, flashbacks, moods, and cognitions of trauma Learn and implement coping skills that result in a reduction of trauma related symptoms Begin a healthy grieving process Alleviate depressive symptoms Recognize, accept, and cope with depressive feelings Develop healthy thinking patterns Develop healthy interpersonal relationships  Objectives target date for all objectives is 09/06/2022 Verbalize an understanding of the cognitive, physiological, and behavioral components of post traumatic stress disorder Describe in detail reminders of the triggers related to trauma  Learn and implement strategies to manage thoughts, behaviors, and feelings of trauma Learning and implement calming skills  to reduce  overall trauma Verbalize an understanding of the role that cognitive biases play in excessive irrational worry and persistent trauma symptoms Learn and implement personal skills to manage challenging situations Learn and implement problem solving strategies Identify and engage in pleasant activities Learn to accept limitations in life and commit to tolerating, rather than avoiding, unpleasant emotions while accomplishing meaningful goals Maintain involvement in work, family, and social activities Reestablish a consistent sleep-wake cycle Cooperate with a medical evaluation  Tell in detail the story of the current loss that is triggering symptoms Read books on the topic of grief Watch videos on the theme of grief Begin verbalizing feelings associated with the loss Attend a grief support group express thoughts and feelings about the deceased Identify and voice positives about the deceased implement acts of spiritual faith  Cooperate with a medication evaluation by a physician Verbalize an accurate understanding of depression Verbalize an understanding of the treatment Identify and replace thoughts that support depression Learn and implement behavioral strategies Verbalize an understanding and resolution of current interpersonal problems Learn and implement problem solving and decision making skills Learn and implement conflict resolution skills to resolve interpersonal problems Verbalize an understanding of healthy and unhealthy emotions verbalize insight into how past relationships may be influence current experiences with depression Use mindfulness and acceptance strategies and increase value based behavior  Increase hopeful statements about the future.  Interventions Engage the patient in behavioral activation Use instruction, modeling, and role-playing to build the client's general social, communication, and/or conflict resolution skills Use Acceptance and Commitment Therapy to help  client accept uncomfortable realities in order to accomplish value-consistent goals Support the client in following through with work, family, and social activities Teach and implement sleep hygiene practices  Refer the patient to a physician for a psychotropic medication consultation Monito the clint's psychotropic medication compliance Discuss how trauma typically involves excessive worry, various bodily expressions of tension, and avoidance of what is threatening that interact to maintain the problem  Teach the patient relaxation skills Assign the patient homework Discuss examples demonstrating that unrealistic worry overestimates the probability of threats and underestimates patient's ability  Assist the patient in analyzing his or her worries Help patient understand that avoidance is reinforcing  create a safe environment and actively build trust use empathy, compassion, and support ask the patient to write a letter to the lost person conduct empty chair ask the patient to discuss and list the positives and negative aspects of the person encourage patient to rely upon his/her spiritual faith  ask client to read books on grief ask patient to watch videos about grief assist patient in identifying emotions  ask patient to attend support group  Consistent with treatment model, discuss how change in cognitive, behavioral, and interpersonal can help client alleviate depression CBT Behavioral activation help the client explore the relationship, nature of the dispute,  Help the client develop new interpersonal skills and relationships Conduct Problem so living therapy Teach conflict resolution skills Use a process-experiential approach Conduct TLDP Conduct ACT Evaluate need for psychotropic medication Monitor adherence to medication   The patient and clinician reviewed the treatment plan on 09/15/2021. The patient approved of the treatment plan.    Hilbert Corrigan, PsyD

## 2021-10-26 ENCOUNTER — Encounter: Payer: Self-pay | Admitting: Radiology

## 2021-10-26 ENCOUNTER — Telehealth: Payer: Self-pay | Admitting: Orthopedic Surgery

## 2021-10-26 NOTE — Telephone Encounter (Signed)
Patient called asked if she can get a note put in mychart stating that she can return to work without restrictions. The number to contact patient if needed is [phone]

## 2021-10-26 NOTE — Telephone Encounter (Signed)
I called and she asked to be released tomorrow. Note written and sent to her via my chart

## 2021-11-10 DIAGNOSIS — G4733 Obstructive sleep apnea (adult) (pediatric): Secondary | ICD-10-CM | POA: Diagnosis not present

## 2021-11-11 DIAGNOSIS — G4733 Obstructive sleep apnea (adult) (pediatric): Secondary | ICD-10-CM | POA: Diagnosis not present

## 2021-11-21 ENCOUNTER — Ambulatory Visit: Payer: 59 | Admitting: Psychologist

## 2021-11-22 ENCOUNTER — Other Ambulatory Visit: Payer: Self-pay | Admitting: Family Medicine

## 2021-11-22 ENCOUNTER — Other Ambulatory Visit: Payer: Self-pay

## 2021-11-22 NOTE — Telephone Encounter (Signed)
Refill request for amphetamine-dextroamphetamine (ADDERALL XR) 30 MG 24 hr capsule  LOV - 07/07/21 Next OV - not scheduled Last refill - 10/18/21 #30/0

## 2021-11-23 ENCOUNTER — Other Ambulatory Visit: Payer: Self-pay

## 2021-11-23 ENCOUNTER — Other Ambulatory Visit: Payer: Self-pay | Admitting: Family Medicine

## 2021-11-24 ENCOUNTER — Other Ambulatory Visit: Payer: Self-pay

## 2021-11-24 NOTE — Telephone Encounter (Signed)
Refill request for amphetamine-dextroamphetamine (ADDERALL XR) 30 MG 24 hr capsule  LOV - 07/07/21 Next OV - not scheduled Last refill - 10/18/21 #30/0  

## 2021-11-26 ENCOUNTER — Other Ambulatory Visit: Payer: Self-pay

## 2021-11-26 MED FILL — Amphetamine-Dextroamphetamine Cap ER 24HR 30 MG: ORAL | 30 days supply | Qty: 30 | Fill #0 | Status: AC

## 2021-11-27 ENCOUNTER — Other Ambulatory Visit (HOSPITAL_COMMUNITY): Payer: Self-pay | Admitting: Radiology

## 2021-11-27 ENCOUNTER — Ambulatory Visit (HOSPITAL_COMMUNITY)
Admission: RE | Admit: 2021-11-27 | Discharge: 2021-11-27 | Disposition: A | Discharge status: Home / Self Care | Payer: 59 | Source: Ambulatory Visit

## 2021-11-27 ENCOUNTER — Other Ambulatory Visit: Payer: Self-pay

## 2021-11-27 ENCOUNTER — Encounter (HOSPITAL_COMMUNITY): Payer: Self-pay

## 2021-11-27 ENCOUNTER — Ambulatory Visit (HOSPITAL_COMMUNITY)
Admission: RE | Admit: 2021-11-27 | Discharge: 2021-11-27 | Disposition: A | Discharge status: Home / Self Care | Payer: 59 | Source: Ambulatory Visit | Attending: Student | Admitting: Student

## 2021-11-27 DIAGNOSIS — R0609 Other forms of dyspnea: Secondary | ICD-10-CM | POA: Diagnosis not present

## 2021-11-27 LAB — PULMONARY FUNCTION TEST
FEF 25-75 Post: 2.46 L/sec
FEF 25-75 Pre: 2.6 L/sec
FEF2575-%Change-Post: -5 %
FEF2575-%Pred-Post: 83 %
FEF2575-%Pred-Pre: 88 %
FEV1-%Change-Post: -2 %
FEV1-%Pred-Post: 73 %
FEV1-%Pred-Pre: 74 %
FEV1-Post: 1.95 L
FEV1-Pre: 2 L
FEV1FVC-%Change-Post: 0 %
FEV1FVC-%Pred-Pre: 105 %
FEV6-%Change-Post: -1 %
FEV6-%Pred-Post: 70 %
FEV6-%Pred-Pre: 71 %
FEV6-Post: 2.24 L
FEV6-Pre: 2.28 L
FEV6FVC-%Change-Post: 0 %
FEV6FVC-%Pred-Post: 101 %
FEV6FVC-%Pred-Pre: 101 %
FVC-%Change-Post: -2 %
FVC-%Pred-Post: 69 %
FVC-%Pred-Pre: 70 %
FVC-Post: 2.24 L
FVC-Pre: 2.29 L
Post FEV1/FVC ratio: 87 %
Post FEV6/FVC ratio: 100 %
Pre FEV1/FVC ratio: 87 %
Pre FEV6/FVC Ratio: 100 %

## 2021-11-27 MED ORDER — METHACHOLINE 16 MG/ML NEB SOLN: 3.0000 mL | Freq: Once | RESPIRATORY_TRACT | Status: DC

## 2021-11-27 MED ORDER — METHACHOLINE 0.0625 MG/ML NEB SOLN
3.0000 mL | Freq: Once | RESPIRATORY_TRACT | Status: AC
Start: 2021-11-27 — End: 2021-11-27
  Administered 2021-11-27: 0.1875 mg via RESPIRATORY_TRACT
  Filled 2021-11-27: qty 3

## 2021-11-27 MED ORDER — METHACHOLINE 4 MG/ML NEB SOLN
3.0000 mL | Freq: Once | RESPIRATORY_TRACT | Status: AC
  Administered 2021-11-27: 12 mg via RESPIRATORY_TRACT
  Filled 2021-11-27: qty 3

## 2021-11-27 MED ORDER — METHACHOLINE 1 MG/ML NEB SOLN
3.0000 mL | Freq: Once | RESPIRATORY_TRACT | Status: AC
  Administered 2021-11-27: 3 mg via RESPIRATORY_TRACT
  Filled 2021-11-27: qty 3

## 2021-11-27 MED ORDER — ALBUTEROL SULFATE (2.5 MG/3ML) 0.083% IN NEBU
2.5000 mg | INHALATION_SOLUTION | Freq: Once | RESPIRATORY_TRACT | Status: DC
Start: 2021-11-27 — End: 2021-11-27

## 2021-11-27 MED ORDER — METHACHOLINE 0.25 MG/ML NEB SOLN
3.0000 mL | Freq: Once | RESPIRATORY_TRACT | Status: AC
  Administered 2021-11-27: 0.75 mg via RESPIRATORY_TRACT
  Filled 2021-11-27: qty 3

## 2021-11-27 MED ORDER — METHACHOLINE 0.25 MG/ML NEB SOLN
3.0000 mL | Freq: Once | RESPIRATORY_TRACT | Status: DC
Start: 2021-11-27 — End: 2021-11-27

## 2021-11-27 MED ORDER — METHACHOLINE 4 MG/ML NEB SOLN
3.0000 mL | Freq: Once | RESPIRATORY_TRACT | Status: DC
Start: 2021-11-27 — End: 2021-11-27

## 2021-11-27 MED ORDER — METHACHOLINE 0 MG/ML NEB SOLN
3.0000 mL | Freq: Once | RESPIRATORY_TRACT | Status: AC
  Administered 2021-11-27: 3 mL via RESPIRATORY_TRACT
  Filled 2021-11-27: qty 3

## 2021-11-27 MED ORDER — METHACHOLINE 0 MG/ML NEB SOLN: 3.0000 mL | Freq: Once | RESPIRATORY_TRACT | Status: DC

## 2021-11-27 MED ORDER — METHACHOLINE 16 MG/ML NEB SOLN
3.0000 mL | Freq: Once | RESPIRATORY_TRACT | Status: AC
Start: 2021-11-27 — End: 2021-11-27
  Administered 2021-11-27: 48 mg via RESPIRATORY_TRACT
  Filled 2021-11-27: qty 3

## 2021-11-27 MED ORDER — ALBUTEROL SULFATE (2.5 MG/3ML) 0.083% IN NEBU
2.5000 mg | INHALATION_SOLUTION | Freq: Once | RESPIRATORY_TRACT | Status: AC
  Administered 2021-11-27: 2.5 mg via RESPIRATORY_TRACT

## 2021-11-27 MED ORDER — METHACHOLINE 0.0625 MG/ML NEB SOLN: 3.0000 mL | Freq: Once | RESPIRATORY_TRACT | Status: DC

## 2021-11-27 MED ORDER — METHACHOLINE 1 MG/ML NEB SOLN
3.0000 mL | Freq: Once | RESPIRATORY_TRACT | Status: DC
Start: 2021-11-27 — End: 2021-11-27

## 2021-12-04 ENCOUNTER — Telehealth: Payer: Self-pay | Admitting: Student

## 2021-12-04 NOTE — Telephone Encounter (Signed)
Called and spoke with pt letting her know that we would change her appt to a mychart visit and she verbalized understanding. Appt changed. Nothing further needed.

## 2021-12-04 NOTE — Progress Notes (Unsigned)
Synopsis: Referred for dyspnea by [name], MD  Virtual Visit via Video Note   I connected with Crystal Hammond today by a video enabled telemedicine application and verified that I am speaking with the correct person using two identifiers.   Location: Patient: Home Provider: Office   I discussed the limitations of evaluation and management by telemedicine and the availability of in person appointments. The patient expressed understanding and agreed to proceed.   Subjective:   PATIENT ID: Crystal Hammond GENDER: female DOB: 1980/05/05, MRN: [id number]  Chief Complaint  Patient presents with   Follow-up    Using CPAP but she feels like there is a leak.  She states her chest has been feeling heavy and having some increased SOB since she had MCT.    40yF with history of anxiety, endometriosis, covid-19 infection 11/22/20 for second time who is referred by PCP for dyspnea. She had been vaccinated for covid-19.   Had bout of pneumonia after her second covid-19 infection. Never hospitalized.   She has had several admissions in past for pneumonia, including as a child.  Some suspicion for asthma, started on flovent 1 puff BID - has been on it for a few days. Redid carpeting, isolated pets (dogs, cats) in bottom of house. She doesn't have much of a cough. She does have constant central chest burning sensation, not necessarily with deep breath. ALbuterol seems to be the only medication that's helped so far. No associated throat tightness, hoarseness. She doesn't have overtly symptomatic reflux. She does have some post-nasal drip.  Prednisone worsened her rosacea substantially and she didn't notice if it made any difference with her breathing.   Has had some low oxygen saturation at night when she's checked it (as low as 85-88%).   Weight has been stable over years.   She snores, perhaps loud enough to hear through a door. She doesn't truly seem to have excessive daytime sleepiness.  She has had a couple episodes of PND.  Son has OSA, asthma, brother with asthma. 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 She has lived in [place] before - [place], moved to [place] 2017. No recent travel. Smoking 2003-2006, did have significant secondhand smoke exposure. No vaping.   Interval HPI:  Methacholine challenge 11/27/21 reviewed by me with borderline AHR, drop in FEV1 by > 20% at 16 mg/mL but close at 4 mg/mL.  She's still feeling chest tightness and dyspneic with minimal exertion since meth challenge. No cough.   Using CPAP almost every night, always >>4h, and her residual AHI on compliance report 7/22-/23-8/20/23 is excellent (0.4) even when she has greater leak.   Otherwise pertinent review of systems is negative.  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)   Heart murmur    OUTGREW   History of febrile seizure    History of kidney stones    History of pneumonia    Migraines    OCD (obsessive compulsive disorder)    Oral herpes    Pneumonia    PONV (postoperative nausea and vomiting)    Postpartum depression    Pre-diabetes    Psoriasis    PTSD (post-traumatic stress disorder)    Recurrent UTI    Rosacea    Sleep apnea  Family History  Problem Relation Age of Onset   Depression Mother    Alcohol abuse Mother    Hernia Mother    Rashes / Skin problems Mother    Thyroid disease Mother    Anxiety disorder Mother    Depression Father    Alcohol abuse Father    Gout Father    Diabetes Father    Asthma Brother    Bipolar disorder Brother    Drug abuse Brother    ADD / ADHD Brother    Breast cancer Maternal Aunt    Lung cancer Maternal Aunt    Colon cancer Paternal Grandmother    Allergic rhinitis Son    Asthma Son    ADD / ADHD Son    Allergic rhinitis Son    ADD / ADHD Son     Ovarian cancer Neg Hx      Past Surgical History:  Procedure Laterality Date   ABDOMINAL SURGERY  2012   Endometrial Surgery - went through c-section scar   CARPAL TUNNEL RELEASE Right 08/07/2021   Procedure: RIGHT CARPAL TUNNEL RELEASE;  Surgeon: Marlyne Beards, MD;  Location: [facility];  Service: Orthopedics;  Laterality: Right;   CARPAL TUNNEL RELEASE Left 08/30/2021   Procedure: LEFT CARPAL TUNNEL RELEASE;  Surgeon: Marlyne Beards, MD;  Location: [facility];  Service: Orthopedics;  Laterality: Left;   CESAREAN SECTION  2019   CESAREAN SECTION  2007   CESAREAN SECTION  2014   CESAREAN SECTION  2016   ENDOMETRIAL BIOPSY  2011   GASTROSCHISIS CLOSURE     LEEP     x 2   VENTRAL HERNIA REPAIR N/A 12/11/2019   Procedure: HERNIA REPAIR VENTRAL ADULT;  Surgeon: Carolan Shiver, MD;  Location: [facility];  Service: General;  Laterality: N/A;   WISDOM TOOTH EXTRACTION     XI ROBOTIC ASSISTED VENTRAL HERNIA N/A 12/11/2019   Procedure: XI ROBOTIC ASSISTED VENTRAL HERNIA Lap vs open;  Surgeon: Carolan Shiver, MD;  Location: [facility];  Service: General;  Laterality: N/A;    Social History   Socioeconomic History   Marital status: Married    Spouse name: Dustin   Number of children: 4   Years of education: Not on file   Highest education level: Not on file  Occupational History   Occupation: Curator: [employer]  Tobacco Use   Smoking status: Former    Packs/day: 0.30    Years: 3.00    Total pack years: 0.90    Types: Cigarettes    Quit date: 2013    Years since quitting: 10.6    Passive exposure: Past   Smokeless tobacco: Never  Vaping Use   Vaping Use: Never used  Substance and Sexual Activity   Alcohol use: Yes    Alcohol/week: 2.0 standard drinks of alcohol    Types: 2 Cans of beer per week   Drug use: Never   Sexual activity: Yes    Birth control/protection: None  Other Topics Concern   Not on file   Social History Narrative   Three sons and one daughter. Second oldest has autism spectrum disorder and two oldest ADHD.   Right handed   Caffeine- 1 cup per day    Social Determinants of Health   Financial Resource Strain: Not on file  Food Insecurity: Not on file  Transportation Needs: Not on file  Physical Activity: Not on file  Stress: Not on file  Social Connections:  Not on file  Intimate Partner Violence: Not on file     Allergies  Allergen Reactions   Other Swelling    SHELLFISH    Phenazopyridine Hives and Rash   Benadryl [Diphenhydramine]    Prednisone     Intolerant- skin feels hot   Zomig [Zolmitriptan]     Diffuse aches.       Outpatient Medications Prior to Visit  Medication Sig Dispense Refill   albuterol (VENTOLIN HFA) 108 (90 Base) MCG/ACT inhaler Inhale 2 puffs into the lungs every 6 (six) hours as needed for wheezing or shortness of breath. 18 g 0   amphetamine-dextroamphetamine (ADDERALL XR) 30 MG 24 hr capsule Take 1 capsule (30 mg total) by mouth daily. 30 capsule 0   amphetamine-dextroamphetamine (ADDERALL) 10 MG tablet Take 1 tablet (10 mg total) by mouth daily. 30 tablet 0   fluticasone (FLOVENT HFA) 110 MCG/ACT inhaler Inhale 1 puff into the lungs in the morning and at bedtime. 12 g 12   nortriptyline (PAMELOR) 25 MG capsule Take 1 capsule (25 mg total) by mouth at bedtime. 30 capsule 3   propranolol (INDERAL) 20 MG tablet Take 1 tablet (20 mg total) by mouth 2 (two) times daily. 60 tablet 3   No facility-administered medications prior to visit.       Objective:   Physical Exam: Clear strong voice with no cough, audible wheeze. Normal work of breathing.      There were no vitals filed for this visit.     on RA BMI Readings from Last 3 Encounters:  10/24/21 37.11 kg/m  08/30/21 36.60 kg/m  08/07/21 36.73 kg/m   Wt Readings from Last 3 Encounters:  10/24/21 190 lb (86.2 kg)  08/30/21 187 lb 6.3 oz (85 kg)  08/07/21 188 lb 0.8  oz (85.3 kg)     CBC    Component Value Date/Time   WBC 8.5 02/21/2021 0904   RBC 4.47 02/21/2021 0904   HGB 11.9 (L) 02/21/2021 0904   HCT 36.5 02/21/2021 0904   PLT 308.0 02/21/2021 0904   MCV 81.6 02/21/2021 0904   MCH 26.8 12/08/2020 1629   MCHC 32.5 02/21/2021 0904   RDW 14.4 02/21/2021 0904   LYMPHSABS 2.5 02/21/2021 0904   MONOABS 0.5 02/21/2021 0904   EOSABS 0.3 02/21/2021 0904   BASOSABS 0.0 02/21/2021 0904    Eos 300  12/15/20 BNP, d-dimer WNL  Chest Imaging:  CXR 03/14/21 reviewed by me unremarkable  CT stone study 12/18/19 lung bases reviewed by me unremarkable  Pulmonary Functions Testing Results:    Latest Ref Rng & Units 11/27/2021    9:24 AM 04/19/2021   10:50 AM  PFT Results  FVC-Pre L 2.29  2.46   FVC-Predicted Pre % 70  75   FVC-Post L 2.24  2.43   FVC-Predicted Post % 69  75   Pre FEV1/FVC % % 87  86   Post FEV1/FCV % % 87  88   FEV1-Pre L 2.00  2.11   FEV1-Predicted Pre % 74  78   FEV1-Post L 1.95  2.15   DLCO uncorrected ml/min/mmHg  17.44   DLCO UNC% %  91   DLCO corrected ml/min/mmHg  18.35   DLCO COR %Predicted %  95   DLVA Predicted %  113   TLC L  3.57   TLC % Predicted %  80   RV % Predicted %  79    Methacholine challenge 11/27/21 reviewed by me with borderline AHR, drop in  FEV1 by > 20% at 16 mg/mL but close at 4 mg/mL  HST 06/23/21 with moderate OSA AHI 18, O2 nadir 77%    Assessment & Plan:   # DOE # Mild/borderline airway hyperresponsiveness Unclear etiology. Airway hyperresponsiveness raises question of asthma or covid-related small airways disease but sleep-disordered breathing, deconditioning may be playing roles as well.   # OSA on CPAP Using CPAP almost every night, always >>4h, and her residual AHI on compliance report 7/22-/23-8/20/23 is excellent (0.4) even when she has greater leak. Less sleepy during the day.    # Recurrent pneumonias  Plan: - ok to continue flovent, albuterol for now - will send msg to  pharmacy to see which LABA/ICS is least expensive for her to try in case this represents asthma - discussed reaching out to adapt to trial alternative masks, consider also adding mask liner if alternative masks not any better - recommended prevnar 20, RSV and flu vaccination after discussing risks/benefits  RTC 3 months     Omar Person, MD [facility] 12/05/2021 11:14 AM

## 2021-12-05 ENCOUNTER — Telehealth (INDEPENDENT_AMBULATORY_CARE_PROVIDER_SITE_OTHER): Payer: 59 | Admitting: Student

## 2021-12-05 ENCOUNTER — Telehealth: Payer: Self-pay | Admitting: Student

## 2021-12-05 DIAGNOSIS — G4733 Obstructive sleep apnea (adult) (pediatric): Secondary | ICD-10-CM | POA: Diagnosis not present

## 2021-12-05 DIAGNOSIS — Z9989 Dependence on other enabling machines and devices: Secondary | ICD-10-CM

## 2021-12-05 DIAGNOSIS — R0609 Other forms of dyspnea: Secondary | ICD-10-CM

## 2021-12-05 NOTE — Telephone Encounter (Signed)
What is least expensive laba/ics inhaler for her?  Thanks! 

## 2021-12-06 ENCOUNTER — Other Ambulatory Visit (HOSPITAL_COMMUNITY): Payer: Self-pay

## 2021-12-14 ENCOUNTER — Other Ambulatory Visit: Payer: Self-pay

## 2021-12-14 MED ORDER — FLUTICASONE-SALMETEROL 250-50 MCG/ACT IN AEPB
1.0000 | INHALATION_SPRAY | Freq: Two times a day (BID) | RESPIRATORY_TRACT | 11 refills | Status: DC
  Filled 2021-12-14: qty 60, 30d supply, fill #0
  Filled 2022-01-25: qty 60, 30d supply, fill #1
  Filled 2022-05-07: qty 60, 30d supply, fill #2

## 2021-12-15 ENCOUNTER — Other Ambulatory Visit: Payer: Self-pay

## 2022-01-02 ENCOUNTER — Other Ambulatory Visit: Payer: Self-pay | Admitting: Family Medicine

## 2022-01-02 ENCOUNTER — Other Ambulatory Visit: Payer: Self-pay

## 2022-01-02 MED ORDER — AMPHETAMINE-DEXTROAMPHET ER 30 MG PO CP24
30.0000 mg | ORAL_CAPSULE | Freq: Every day | ORAL | 0 refills | Status: DC
  Filled 2022-01-02: qty 30, 30d supply, fill #0

## 2022-01-02 MED ORDER — AMPHETAMINE-DEXTROAMPHETAMINE 10 MG PO TABS
10.0000 mg | ORAL_TABLET | Freq: Every day | ORAL | 0 refills | Status: DC
  Filled 2022-01-02: qty 30, 30d supply, fill #0

## 2022-01-02 NOTE — Telephone Encounter (Signed)
Refill request  Adderall 30 mg last refill 11/26/21 #30 Adderall 10 mg last refill 07/07/21 #30 Last office visit 07/07/21 No upcoming appointment scheduled

## 2022-01-26 ENCOUNTER — Other Ambulatory Visit: Payer: Self-pay

## 2022-01-26 ENCOUNTER — Ambulatory Visit: Payer: 59 | Admitting: Allergy

## 2022-01-29 ENCOUNTER — Other Ambulatory Visit: Payer: Self-pay | Admitting: Family Medicine

## 2022-01-29 NOTE — Telephone Encounter (Signed)
Refill request for amphetamine-dextroamphetamine (ADDERALL XR) 30 MG 24 hr capsule and amphetamine-dextroamphetamine (ADDERALL) 10 MG tablet  LOV - 07/07/21 Next OV - not scheduled Last refill - both rxs 01/02/22 #30/0

## 2022-01-30 ENCOUNTER — Other Ambulatory Visit: Payer: Self-pay

## 2022-01-30 ENCOUNTER — Ambulatory Visit: Payer: 59 | Admitting: Internal Medicine

## 2022-01-30 ENCOUNTER — Telehealth: Payer: Self-pay

## 2022-01-30 ENCOUNTER — Encounter: Payer: Self-pay | Admitting: Internal Medicine

## 2022-01-30 ENCOUNTER — Ambulatory Visit (INDEPENDENT_AMBULATORY_CARE_PROVIDER_SITE_OTHER)
Admission: RE | Admit: 2022-01-30 | Discharge: 2022-01-30 | Disposition: A | Discharge status: Home / Self Care | Payer: 59 | Source: Ambulatory Visit | Attending: Internal Medicine | Admitting: Internal Medicine

## 2022-01-30 VITALS — BP 100/60 | HR 76 | Temp 97.6°F | Ht 60.0 in | Wt 188.0 lb

## 2022-01-30 DIAGNOSIS — J4521 Mild intermittent asthma with (acute) exacerbation: Secondary | ICD-10-CM

## 2022-01-30 DIAGNOSIS — J45909 Unspecified asthma, uncomplicated: Secondary | ICD-10-CM | POA: Insufficient documentation

## 2022-01-30 DIAGNOSIS — R0789 Other chest pain: Secondary | ICD-10-CM | POA: Diagnosis not present

## 2022-01-30 LAB — POC COVID19 BINAXNOW: SARS Coronavirus 2 Ag: NEGATIVE

## 2022-01-30 MED ORDER — AMPHETAMINE-DEXTROAMPHETAMINE 10 MG PO TABS
10.0000 mg | ORAL_TABLET | Freq: Every day | ORAL | 0 refills | Status: DC
  Filled 2022-01-30: qty 30, 30d supply, fill #0

## 2022-01-30 MED ORDER — ALBUTEROL SULFATE (2.5 MG/3ML) 0.083% IN NEBU
2.5000 mg | INHALATION_SOLUTION | Freq: Four times a day (QID) | RESPIRATORY_TRACT | 1 refills | Status: AC | PRN
  Filled 2022-01-30: qty 150, 13d supply, fill #0

## 2022-01-30 MED ORDER — AMPHETAMINE-DEXTROAMPHET ER 30 MG PO CP24
30.0000 mg | ORAL_CAPSULE | Freq: Every day | ORAL | 0 refills | Status: DC
  Filled 2022-01-30: qty 30, 30d supply, fill #0

## 2022-01-30 NOTE — Telephone Encounter (Signed)
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 ? HX of asthma. Pt request to be seen today but pt said she will not go to ED. Pt scheduled appt 01/30/22 at 11:15 with Dr Silvio Pate. UC & ED precautions given and pt voiced understanding. Sending note to Dr Silvio Pate and Larene Beach CMA.

## 2022-01-30 NOTE — Telephone Encounter (Signed)
[facility] - Client TELEPHONE ADVICE RECORD [facility] Patient Name: Crystal Hammond [facility] Gender: Female DOB: [birth year] Age: 41 Y 38 M 2 D Return Phone Number: [phone] (Primary) Address: City/ State/ Zip: [place] [address] Client [facility] - Client Client Site [place] - Day Provider Renford Dills - MD Contact Type Call Who Is Calling Patient / Member / Family / Caregiver Call Type Triage / Clinical Relationship To Patient Self Return Phone Number [phone] (Primary) Chief Complaint CHEST PAIN - pain, pressure, heaviness or tightness Reason for Call Symptomatic / Request for [name]. Translation No Nurse Assessment Nurse: Altamease Oiler, RN, Adriana Date/Time (Eastern Time): 01/30/2022 9:24:09 AM Confirm and document reason for call. If symptomatic, describe symptoms. ---pt states this morning she almost passed out. reports tightness with breathing. tightness is still present. history of asthma. used rescue inhaler at 0830 with no help Does the patient have any new or worsening symptoms? ---Yes Will a triage be completed? ---Yes Related visit to physician within the last 2 weeks? ---No Does the PT have any chronic conditions? (i.e. diabetes, asthma, this includes High risk factors for pregnancy, etc.) ---Yes List chronic conditions. ---asthma gastroschisis gestational diabetes depression anxiety ptsd Is the patient pregnant or possibly pregnant? (Ask all females between the ages of 62-55) ---No Is this a behavioral health or substance abuse call? ---No Guidelines Guideline Title Affirmed Question Affirmed Notes Nurse Date/Time (Eastern Time) Asthma Attack MILD asthma attack (e.g., no SOB at rest, mild SOB with walking, speaks normally Terry, Therapist, sports, Fabio Bering 01/30/2022 9:27:15 AM PLEASE  NOTE: All timestamps contained within this report are represented as [place] Standard Time. CONFIDENTIALTY NOTICE: This fax transmission is intended only for the addressee. It contains information that is legally privileged, confidential or otherwise protected from use or disclosure. If you are not the intended recipient, you are strictly prohibited from reviewing, disclosing, copying using or disseminating any of this information or taking any action in reliance on or regarding this information. If you have received this fax in error, please notify us immediately by telephone so that we can arrange for its return to [place]. Phone: [phone], Toll-Free: [phone], Fax: [phone] Page: 2 of 2 Call Id: [id number] Guidelines Guideline Title Affirmed Question Affirmed Notes Nurse Date/Time Eilene Ghazi Time) in sentences, mild wheezing) Disp. Time Eilene Ghazi Time) Disposition Final User 01/30/2022 9:21:45 AM Send to Urgent Patrice Paradise 01/30/2022 9:34:03 AM See PCP within 24 Hours Yes Altamease Oiler RN, Fabio Bering Final Disposition 01/30/2022 9:34:03 AM See PCP within 24 Hours Yes Altamease Oiler, RN, Fabio Bering Disposition Overriden: Home Care Override Reason: Patient's symptoms need a higher level of care Caller Disagree/Comply Comply Caller Understands Yes PreDisposition Call Doctor Care Advice Given Per Guideline SEE PCP WITHIN 24 HOURS: * IF OFFICE WILL BE OPEN: You need to be examined within the next 24 hours. Call your doctor (or NP/PA) when the office opens and make an appointment. AVOID ASTHMA TRIGGERS: * Avoid triggers that you know cause your asthma attacks. Some common asthma attack triggers are: * ... Allergens: Allergens can be seasonal (pollen) or year-long (house dust mites, mold, animal dander) * ... Medicines: Aspirin and non-steroidal anti-inflammatory drugs (NSAIDs) such as ibuprofen (Advil, Motrin) and naproxen (Aleve, Naprosyn) can trigger an asthma attack in some people. * ... Perfumes and  strong smells CALL BACK IF: CARE ADVICE given per Asthma Attack (Adult) guideline. *  You become worse Referrals REFERRED TO PCP OFFIC

## 2022-01-30 NOTE — Telephone Encounter (Signed)
See OV note.  

## 2022-01-30 NOTE — Progress Notes (Signed)
Subjective:    Patient ID: Crystal Hammond, female    DOB: 08-10-1980, 41 y.o.   MRN: [id number]  HPI Here due to chest symptoms  Has had COVID 4 times Having feeling of tight breathing/chest symptoms Got lightheaded upon standing up--felt like she was going to pass out but didn't(works in ER at [employer]) Wasn't feeling great yesterday--just tired Ate dinner and went to bed Then slightly worse this morning---so tried to go to work Some chills. Sweat earlier  No fever No cough Advair for asthma---feels it is not working lately Rarely needs the albuterol--but has been taking in past 2 days (nebulizer works better)  Never hospitalized for asthma--but has had recurrent pneumonia Past prednisone--but gets hot/face red on it  Current Outpatient Medications on File Prior to Visit  Medication Sig Dispense Refill   albuterol (VENTOLIN HFA) 108 (90 Base) MCG/ACT inhaler Inhale 2 puffs into the lungs every 6 (six) hours as needed for wheezing or shortness of breath. 18 g 0   amphetamine-dextroamphetamine (ADDERALL XR) 30 MG 24 hr capsule Take 1 capsule (30 mg total) by mouth daily. 30 capsule 0   amphetamine-dextroamphetamine (ADDERALL) 10 MG tablet Take 1 tablet (10 mg total) by mouth daily. 30 tablet 0   fluticasone-salmeterol (ADVAIR DISKUS) 250-50 MCG/ACT AEPB Inhale 1 puff into the lungs in the morning and at bedtime. 60 each 11   nortriptyline (PAMELOR) 25 MG capsule Take 1 capsule (25 mg total) by mouth at bedtime. 30 capsule 3   propranolol (INDERAL) 20 MG tablet Take 1 tablet (20 mg total) by mouth 2 (two) times daily. 60 tablet 3   No current facility-administered medications on file prior to visit.    Allergies  Allergen Reactions   Other Swelling    SHELLFISH    Phenazopyridine Hives and Rash   Benadryl [Diphenhydramine]    Prednisone     Intolerant- skin feels hot   Zomig [Zolmitriptan]     Diffuse aches.      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)   Heart murmur    OUTGREW   History of febrile seizure    History of kidney stones    History of pneumonia    Migraines    OCD (obsessive compulsive disorder)    Oral herpes    Pneumonia    PONV (postoperative nausea and vomiting)    Postpartum depression    Pre-diabetes    Psoriasis    PTSD (post-traumatic stress disorder)    Recurrent UTI    Rosacea    Sleep apnea     Past Surgical History:  Procedure Laterality Date   ABDOMINAL SURGERY  2012   Endometrial Surgery - went through c-section scar   CARPAL TUNNEL RELEASE Right 08/07/2021   Procedure: RIGHT CARPAL TUNNEL RELEASE;  Surgeon: Sherilyn Cooter, MD;  Location: [place];  Service: Orthopedics;  Laterality: Right;   CARPAL TUNNEL RELEASE Left 08/30/2021   Procedure: LEFT CARPAL TUNNEL RELEASE;  Surgeon: Sherilyn Cooter, MD;  Location: [place];  Service: Orthopedics;  Laterality: Left;   CESAREAN SECTION  2019   CESAREAN SECTION  2007   CESAREAN SECTION  2014   CESAREAN SECTION  2016   ENDOMETRIAL BIOPSY  2011   GASTROSCHISIS CLOSURE     LEEP  x 2   VENTRAL HERNIA REPAIR N/A 12/11/2019   Procedure: HERNIA REPAIR VENTRAL ADULT;  Surgeon: Carolan Shiver, MD;  Location: [facility];  Service: General;  Laterality: N/A;   WISDOM TOOTH EXTRACTION     XI ROBOTIC ASSISTED VENTRAL HERNIA N/A 12/11/2019   Procedure: XI ROBOTIC ASSISTED VENTRAL HERNIA Lap vs open;  Surgeon: Carolan Shiver, MD;  Location: [facility];  Service: General;  Laterality: N/A;    Family History  Problem Relation Age of Onset   Depression Mother    Alcohol abuse Mother    Hernia Mother    Rashes / Skin problems Mother    Thyroid disease Mother    Anxiety disorder Mother    Depression Father    Alcohol abuse Father    Gout Father    Diabetes Father    Asthma  Brother    Bipolar disorder Brother    Drug abuse Brother    ADD / ADHD Brother    Breast cancer Maternal Aunt    Lung cancer Maternal Aunt    Colon cancer Paternal Grandmother    Allergic rhinitis Son    Asthma Son    ADD / ADHD Son    Allergic rhinitis Son    ADD / ADHD Son    Ovarian cancer Neg Hx     Social History   Socioeconomic History   Marital status: Married    Spouse name: Dustin   Number of children: 4   Years of education: Not on file   Highest education level: Not on file  Occupational History   Occupation: Curator: [employer]  Tobacco Use   Smoking status: Former    Packs/day: 0.30    Years: 3.00    Total pack years: 0.90    Types: Cigarettes    Quit date: 2013    Years since quitting: 10.7    Passive exposure: Past   Smokeless tobacco: Never  Vaping Use   Vaping Use: Never used  Substance and Sexual Activity   Alcohol use: Yes    Alcohol/week: 2.0 standard drinks of alcohol    Types: 2 Cans of beer per week   Drug use: Never   Sexual activity: Yes    Birth control/protection: None  Other Topics Concern   Not on file  Social History Narrative   Three sons and one daughter. Second oldest has autism spectrum disorder and two oldest ADHD.   Right handed   Caffeine- 1 cup per day    Social Determinants of Health   Financial Resource Strain: Not on file  Food Insecurity: Not on file  Transportation Needs: Not on file  Physical Activity: Not on file  Stress: Not on file  Social Connections: Not on file  Intimate Partner Violence: Not on file   Review of Systems No N/V Eating okay     Objective:   Physical Exam Constitutional:      Comments: Looks tired/?mildly ill--but no distress  HENT:     Right Ear: Tympanic membrane and ear canal normal.     Left Ear: Tympanic membrane and ear canal normal.     Mouth/Throat:     Pharynx: No oropharyngeal exudate or posterior oropharyngeal erythema.  Pulmonary:     Effort:  Pulmonary effort is normal.     Breath sounds: Normal breath sounds. No wheezing or rales.  Musculoskeletal:     Cervical back: Neck supple.  Lymphadenopathy:     Cervical: No cervical adenopathy.  Neurological:     Mental Status: She is alert.            Assessment & Plan:

## 2022-01-30 NOTE — Assessment & Plan Note (Addendum)
Has some chest tightness and mild infection Not overly tight and doesn't tolerate prednisone Likely viral infection---COVID test negative Will check CXR---looks normal  Continue regular inhalers---albuterol nebs refilled (work better) Tylenol etc

## 2022-01-31 ENCOUNTER — Ambulatory Visit: Payer: 59 | Admitting: Nurse Practitioner

## 2022-02-03 ENCOUNTER — Encounter: Payer: Self-pay | Admitting: Family Medicine

## 2022-02-05 ENCOUNTER — Ambulatory Visit
Admission: RE | Admit: 2022-02-05 | Discharge: 2022-02-05 | Disposition: A | Discharge status: Home / Self Care | Payer: 59 | Source: Ambulatory Visit

## 2022-02-05 ENCOUNTER — Other Ambulatory Visit: Payer: Self-pay

## 2022-02-05 ENCOUNTER — Telehealth: Payer: Self-pay | Admitting: Family Medicine

## 2022-02-05 VITALS — BP 109/73 | HR 69 | Temp 97.8°F | Resp 17

## 2022-02-05 DIAGNOSIS — J45909 Unspecified asthma, uncomplicated: Secondary | ICD-10-CM | POA: Diagnosis not present

## 2022-02-05 MED ORDER — BUDESONIDE 0.5 MG/2ML IN SUSP
0.5000 mg | Freq: Two times a day (BID) | RESPIRATORY_TRACT | 0 refills | Status: DC
  Filled 2022-02-05: qty 60, 15d supply, fill #0

## 2022-02-05 MED ORDER — AZITHROMYCIN 250 MG PO TABS
ORAL_TABLET | ORAL | 0 refills | Status: DC
  Filled 2022-02-05: qty 6, 5d supply, fill #0

## 2022-02-05 NOTE — ED Triage Notes (Signed)
Pt. Presents to UC w/ c/o burning w/ breathing, emesis and diarrhea. Pt. States she started having trouble breathing last Tuesday and was diagnosed w/ Bronchitis w/ asthma exacerbation. Pt.states over the weekend emesis and diarrhea ensued.

## 2022-02-05 NOTE — Telephone Encounter (Signed)
Please call and triage patient about cough/symptoms.  Please see most recent mychart message below.  Thanks.  ======================================  [facility] (supporting You)      02/03/22  6:21 PM    I was seen Tuesday 10/17 and have been feeling worse since. Some fevers, body aches, chills. Covid test was negative, chest X-ray was clear but I still have the breathing tightness so I've been doing the breathing treatments to help and that does help bring up some green mucus. Having a hard time eating or keeping it down when I have eaten. Having overall generalized weakness and some muscle cramping. Absolutely trying to avoid the hospital. Called off work for 10/22 7a-7p shift that was previously thought I could make when I was seen in office. Is this something I should just continue to try to ride out or should I try to come in to be reevaluated?

## 2022-02-05 NOTE — Telephone Encounter (Signed)
I spoke with pt; pt said tightness and burning centralized in chest; SOB upon exertion.still prod cough with green phlegm; no available appts at [facility] or [facility]. I scheduled pt appt at [facility] for 02/05/22 at 12 noon with UC & ED precautions and pt voiced understanding. Sending note to Dr Damita Dunnings.

## 2022-02-05 NOTE — ED Provider Notes (Signed)
Crystal Hammond    CSN: [id number] Arrival date & time: 02/05/22  1136      History   Chief Complaint Chief Complaint  Patient presents with   Cough    Entered by patient    HPI Crystal Hammond is a 41 y.o. female.    Cough   Patient presents to urgent care with complaint of 6 days of symptoms.  Symptoms include chest "tightness" with feeling of "burning" worsening with breathing.  She endorses new symptoms of vomiting and diarrhea in the last few days.  She was seen in primary care 6 days ago when she presented with complaint of chest tightness and diagnosed with asthmatic bronchitis with acute exacerbation.  She endorses intolerance to p.o. prednisone stating that she feels "itchy" and "hot" on her skin.  Asthma is treated with albuterol inhaler as needed and Advair discus per patient.  Her chart also shows prescriptions for Symbicort and Dulera (prescribed 12/06/2021).  She states she has been using albuterol via nebulizer since her symptoms began.  Tested for COVID and negative in primary care 6 days ago.  Not tested for influenza or other respiratory viruses.  She endorses general feeling of unwellness.  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)   Heart murmur    OUTGREW   History of febrile seizure    History of kidney stones    History of pneumonia    Migraines    OCD (obsessive compulsive disorder)    Oral herpes    Pneumonia    PONV (postoperative nausea and vomiting)    Postpartum depression    Pre-diabetes    Psoriasis    PTSD (post-traumatic stress disorder)    Recurrent UTI    Rosacea    Sleep apnea     Patient Active Problem List   Diagnosis Date Noted   Asthmatic bronchitis 01/30/2022   Carpal tunnel syndrome, left upper limb    Carpal tunnel syndrome, right upper limb    Bilateral carpal  tunnel syndrome 07/31/2021   Advance care planning 07/09/2021   Carpal tunnel syndrome 07/09/2021   OSA (obstructive sleep apnea) 07/09/2021   SOBOE (shortness of breath on exertion) 03/19/2021   Tachycardia 03/19/2021   Abdominal pain 02/21/2021   Routine general medical examination at a health care facility 01/15/2021   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   Itching 07/22/2020   Vitamin D deficiency 01/26/2020   Incisional hernia 12/11/2019   Prediabetes 07/26/2019   GAD (generalized anxiety disorder) 06/25/2019   Adult ADHD 06/25/2019   Depressive disorder 06/25/2019   Family history of congenital anomalies 05/27/2017   Herpes 05/27/2017   Hx of cesarean section 05/27/2017   Migraine with aura 05/27/2017   Agoraphobia 05/30/2016   History of abnormal cervical Pap smear 01/31/2016   History of kidney stones 01/31/2016    Past Surgical History:  Procedure Laterality Date   ABDOMINAL SURGERY  2012   Endometrial Surgery - went through c-section scar   CARPAL TUNNEL RELEASE Right 08/07/2021   Procedure: RIGHT CARPAL TUNNEL RELEASE;  Surgeon: Marlyne Beards, MD;  Location: [facility];  Service: Orthopedics;  Laterality: Right;   CARPAL TUNNEL RELEASE Left 08/30/2021   Procedure: LEFT CARPAL TUNNEL RELEASE;  Surgeon: Marlyne Beards, MD;  Location:  SURGERY CENTER;  Service: Orthopedics;  Laterality: Left;   CESAREAN SECTION  2019   CESAREAN SECTION  2007   CESAREAN SECTION  2014   CESAREAN SECTION  2016   ENDOMETRIAL BIOPSY  2011   GASTROSCHISIS CLOSURE     LEEP     x 2   VENTRAL HERNIA REPAIR N/A 12/11/2019   Procedure: HERNIA REPAIR VENTRAL ADULT;  Surgeon: Herbert Pun, MD;  Location: [facility];  Service: General;  Laterality: N/A;   WISDOM TOOTH EXTRACTION     XI ROBOTIC ASSISTED VENTRAL HERNIA N/A 12/11/2019   Procedure: XI ROBOTIC ASSISTED VENTRAL HERNIA Lap vs open;  Surgeon: Herbert Pun, MD;  Location: [facility];  Service:  General;  Laterality: N/A;    OB History     Gravida  5   Para  4   Term  4   Preterm      AB  1   Living  4      SAB  1   IAB      Ectopic      Multiple      Live Births  4            Home Medications    Prior to Admission medications   Medication Sig Start Date End Date Taking? Authorizing Provider  albuterol (PROVENTIL) (2.5 MG/3ML) 0.083% nebulizer solution Inhale 3 mLs (2.5 mg total) by nebulization every 6 (six) hours as needed for wheezing or shortness of breath. 01/30/22   Venia Carbon, MD  albuterol (VENTOLIN HFA) 108 (90 Base) MCG/ACT inhaler Inhale 2 puffs into the lungs every 6 (six) hours as needed for wheezing or shortness of breath. 03/17/21   [name], MD  amphetamine-dextroamphetamine (ADDERALL XR) 30 MG 24 hr capsule Take 1 capsule (30 mg total) by mouth daily. 01/30/22   [name], MD  amphetamine-dextroamphetamine (ADDERALL) 10 MG tablet Take 1 tablet (10 mg total) by mouth daily. 01/30/22   [name], MD  fluticasone-salmeterol (ADVAIR DISKUS) 250-50 MCG/ACT AEPB Inhale 1 puff into the lungs in the morning and at bedtime. 12/14/21   Maryjane Hurter, MD  nortriptyline (PAMELOR) 25 MG capsule Take 1 capsule (25 mg total) by mouth at bedtime. 10/16/21   Genia Harold, MD  propranolol (INDERAL) 20 MG tablet Take 1 tablet (20 mg total) by mouth 2 (two) times daily. 10/16/21   Genia Harold, MD    Family History Family History  Problem Relation Age of Onset   Depression Mother    Alcohol abuse Mother    Hernia Mother    Rashes / Skin problems Mother    Thyroid disease Mother    Anxiety disorder Mother    Depression Father    Alcohol abuse Father    Gout Father    Diabetes Father    Asthma Brother    Bipolar disorder Brother    Drug abuse Brother    ADD / ADHD Brother    Breast cancer Maternal Aunt    Lung cancer Maternal Aunt    Colon cancer Paternal Grandmother    Allergic rhinitis Son    Asthma Son     ADD / ADHD Son    Allergic rhinitis Son    ADD / ADHD Son    Ovarian cancer Neg Hx     Social History Social History   Tobacco Use   Smoking status: Former    Packs/day: 0.30    Years: 3.00    Total pack years: 0.90    Types: Cigarettes  Quit date: 2013    Years since quitting: 10.8    Passive exposure: Past   Smokeless tobacco: Never  Vaping Use   Vaping Use: Never used  Substance Use Topics   Alcohol use: Yes    Alcohol/week: 2.0 standard drinks of alcohol    Types: 2 Cans of beer per week   Drug use: Never     Allergies   Other, Phenazopyridine, Benadryl [diphenhydramine], Prednisone, and Zomig [zolmitriptan]   Review of Systems Review of Systems  Respiratory:  Positive for cough.      Physical Exam Triage Vital Signs ED Triage Vitals  Enc Vitals Group     BP      Pulse      Resp      Temp      Temp src      SpO2      Weight      Height      Head Circumference      Peak Flow      Pain Score      Pain Loc      Pain Edu?      Excl. in GC?    No data found.  Updated Vital Signs LMP  (LMP Unknown) Comment: signed a pregnancy release  Visual Acuity Right Eye Distance:   Left Eye Distance:   Bilateral Distance:    Right Eye Near:   Left Eye Near:    Bilateral Near:     Physical Exam Vitals reviewed.  Constitutional:      Appearance: She is ill-appearing.  Cardiovascular:     Rate and Rhythm: Normal rate and regular rhythm.  Pulmonary:     Effort: Pulmonary effort is normal.     Breath sounds: Normal breath sounds. No wheezing.  Skin:    General: Skin is warm and dry.  Neurological:     General: No focal deficit present.     Mental Status: She is alert and oriented to person, place, and time.  Psychiatric:        Mood and Affect: Mood normal.        Behavior: Behavior normal.      UC Treatments / Results  Labs (all labs ordered are listed, but only abnormal results are displayed) Labs Reviewed - No data to  display  EKG   Radiology No results found.  Procedures Procedures (including critical care time)  Medications Ordered in UC Medications - No data to display  Initial Impression / Assessment and Plan / UC Course  I have reviewed the triage vital signs and the nursing notes.  Pertinent labs & imaging results that were available during my care of the patient were reviewed by me and considered in my medical decision making (see chart for details).   Continue to suspect viral etiology for her symptoms.  Lungs CTAB without wheezing.  She endorses cough but none during her visit today.  Possibly acute exacerbation of asthma though no symptoms on exam.  Possibly bronchitis.  Would ordinarily treat with p.o. corticosteroid but will not given her reported intolerance.  Will prescribe Z-Pak for its anti-inflammatory activity (recent x-ray with clear lungs-no x-ray available at this facility today for repeat imaging).  Will also prescribe budesonide for nebulizer in order to reduce systemic effects.   Final Clinical Impressions(s) / UC Diagnoses   Final diagnoses:  None   Discharge Instructions   None    ED Prescriptions   None    PDMP not reviewed this encounter.  Charma Igo, [place] 02/05/22 1206

## 2022-02-05 NOTE — Discharge Instructions (Addendum)
Follow up here or with your primary care provider if your symptoms are worsening or not improving.     

## 2022-02-05 NOTE — Telephone Encounter (Signed)
Noted. Thanks.

## 2022-03-16 DIAGNOSIS — Z419 Encounter for procedure for purposes other than remedying health state, unspecified: Secondary | ICD-10-CM | POA: Diagnosis not present

## 2022-04-16 DIAGNOSIS — Z419 Encounter for procedure for purposes other than remedying health state, unspecified: Secondary | ICD-10-CM | POA: Diagnosis not present

## 2022-04-26 IMAGING — CR DG CHEST 2V
1 series · 2 of 2 positions shown · non-contrast
Comparison: 11/30/2020 report only

CLINICAL DATA: Cough.  Evaluate for pneumonia.  Ex-smoker.

EXAM:
CHEST - 2 VIEW

[Series 1: dg chest 2 view · 0.14mm/px · 2 of 2 slices shown]
[im 1/2]
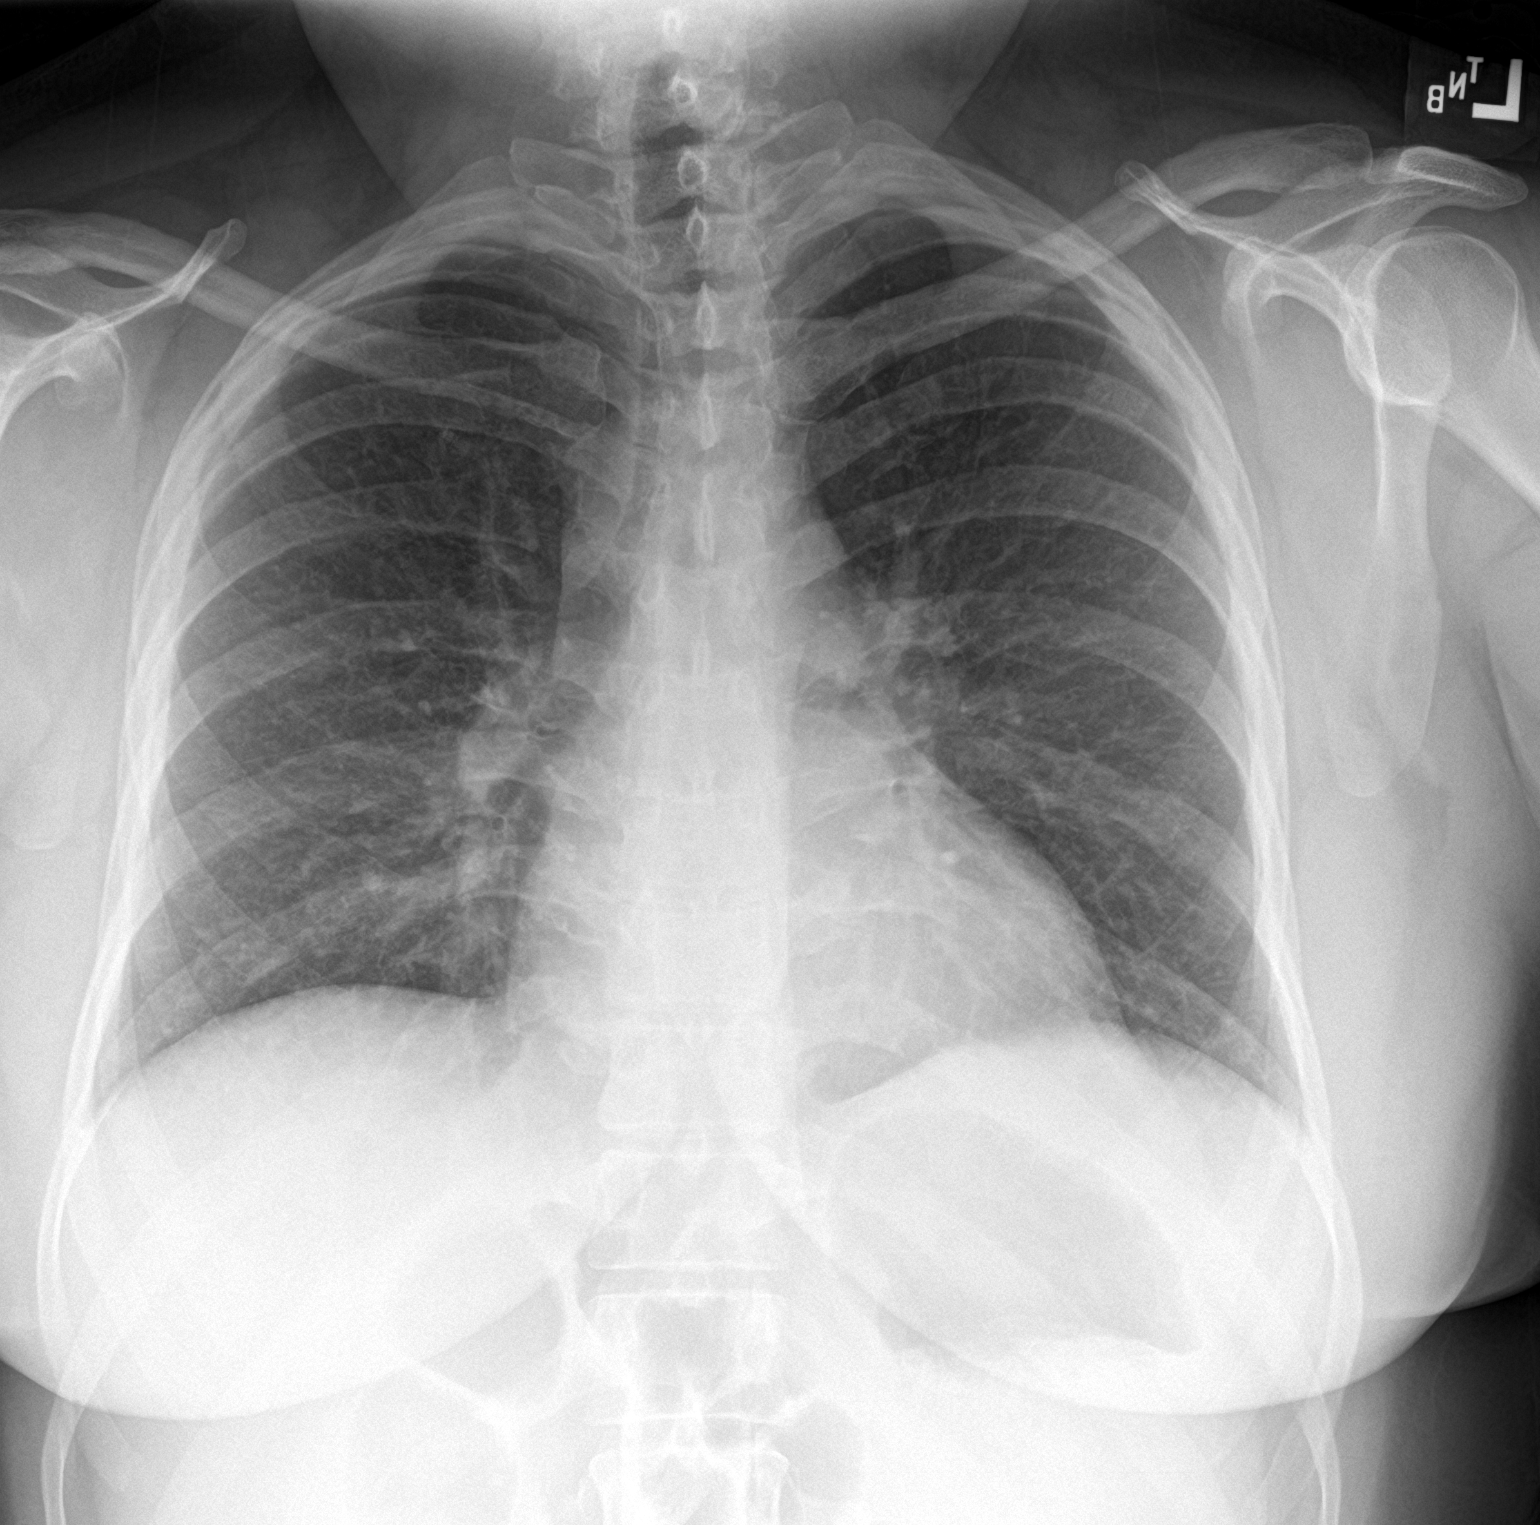
[im 2/2]
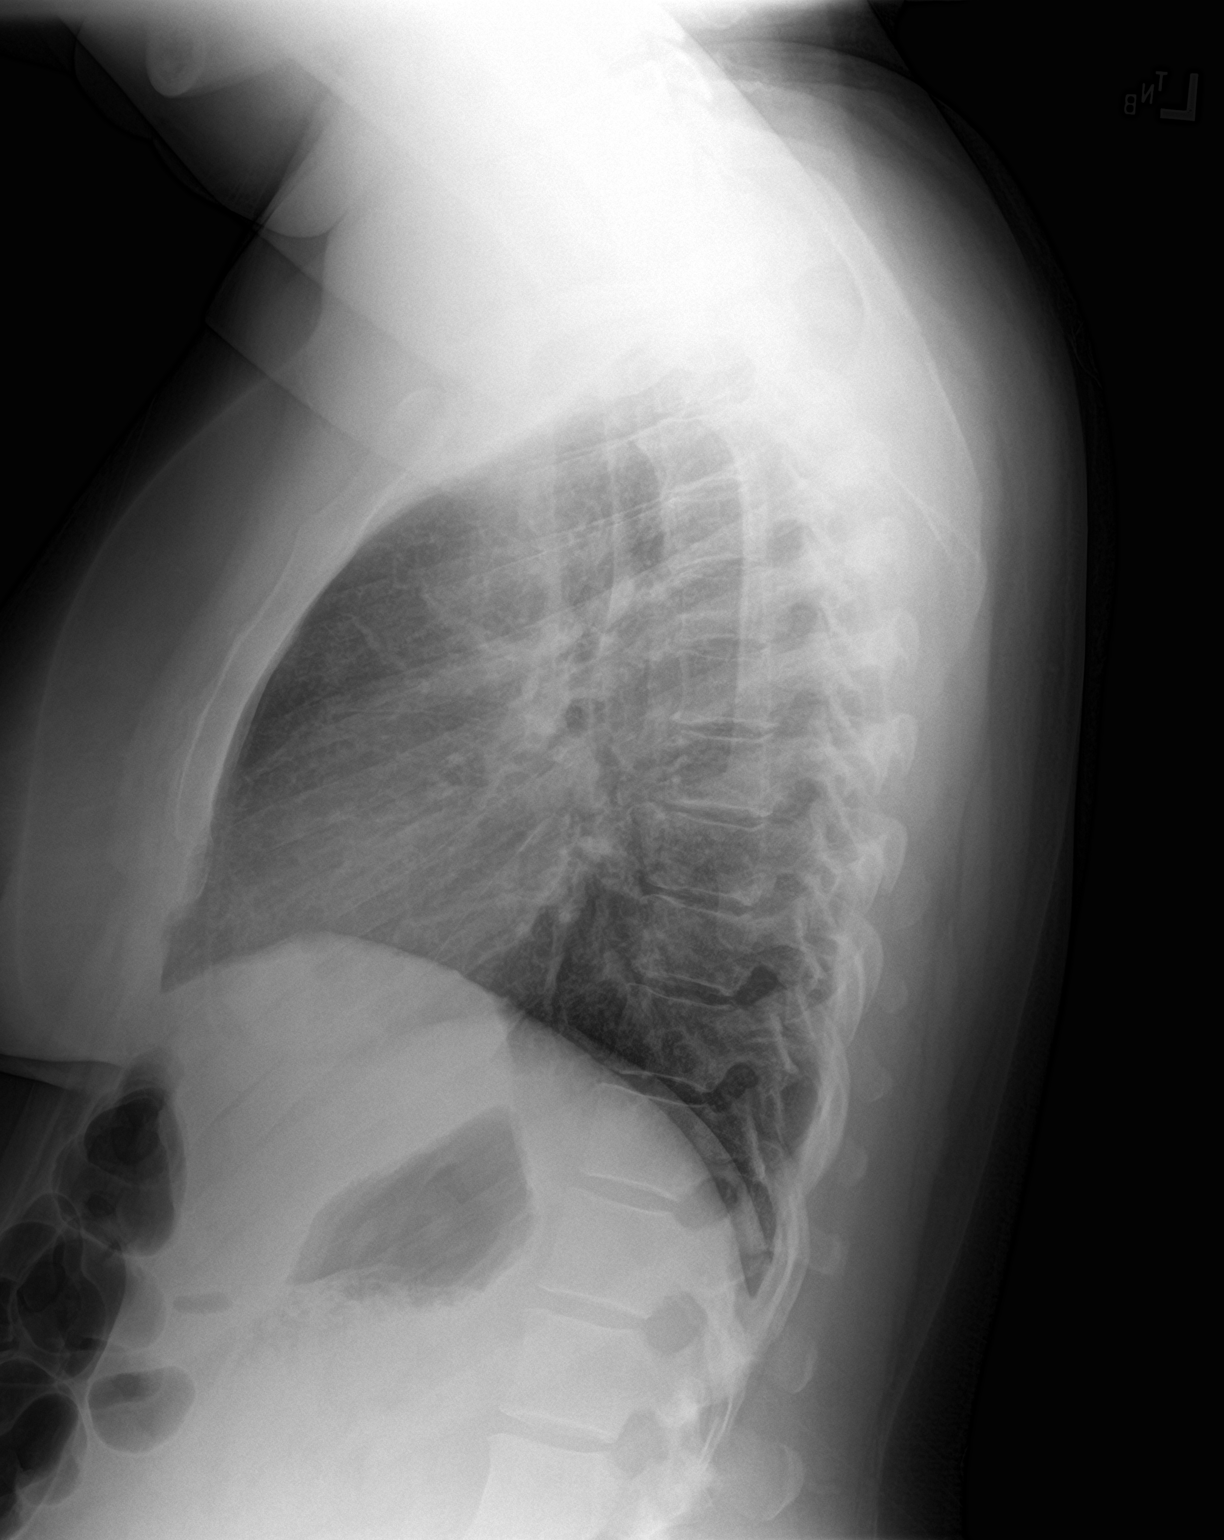

[2 of 2 positions shown; findings below may reference images not displayed]

FINDINGS: Midline trachea. Borderline cardiomegaly. No pleural effusion or
pneumothorax. Clear lungs.
IMPRESSION: No acute cardiopulmonary disease.

## 2022-05-07 ENCOUNTER — Other Ambulatory Visit: Payer: Self-pay | Admitting: Psychiatry

## 2022-05-07 ENCOUNTER — Other Ambulatory Visit: Payer: Self-pay | Admitting: Family Medicine

## 2022-05-07 ENCOUNTER — Other Ambulatory Visit: Payer: Self-pay

## 2022-05-07 NOTE — Telephone Encounter (Signed)
Last visit 07/07/2021 No follow up on file  Last refilled 01/30/2022 # 30 0 refill

## 2022-05-08 ENCOUNTER — Other Ambulatory Visit: Payer: Self-pay

## 2022-05-09 MED ORDER — AMPHETAMINE-DEXTROAMPHET ER 30 MG PO CP24
30.0000 mg | ORAL_CAPSULE | Freq: Every day | ORAL | 0 refills | Status: DC
  Filled 2022-05-09: qty 30, 30d supply, fill #0

## 2022-05-10 ENCOUNTER — Other Ambulatory Visit: Payer: Self-pay

## 2022-05-17 DIAGNOSIS — Z419 Encounter for procedure for purposes other than remedying health state, unspecified: Secondary | ICD-10-CM | POA: Diagnosis not present

## 2022-05-18 ENCOUNTER — Telehealth: Payer: Self-pay | Admitting: Family Medicine

## 2022-05-18 NOTE — Telephone Encounter (Signed)
Patient would like to know if a lipid panel and testing for lupus could possibly be added to her cpe orders that she are scheduled for on 05/31/22?

## 2022-05-20 NOTE — Telephone Encounter (Signed)
We need to talk about this at the office visit before we do any extra testing.  I think it makes sense to get the labs done at or after the office visit.  I would cancel the lab visit ahead of the office visit.

## 2022-05-21 NOTE — Telephone Encounter (Signed)
Spoke with patient and she is okay with waiting to do labs at visit. Lab appt cancelled.

## 2022-05-31 ENCOUNTER — Other Ambulatory Visit: Payer: Commercial Managed Care - PPO

## 2022-06-07 ENCOUNTER — Telehealth: Payer: Self-pay

## 2022-06-07 ENCOUNTER — Encounter: Payer: Self-pay | Admitting: Family Medicine

## 2022-06-07 ENCOUNTER — Ambulatory Visit (INDEPENDENT_AMBULATORY_CARE_PROVIDER_SITE_OTHER): Payer: BC Managed Care – PPO | Admitting: Family Medicine

## 2022-06-07 VITALS — BP 110/64 | HR 84 | Temp 98.0°F | Ht 59.75 in | Wt 183.0 lb

## 2022-06-07 DIAGNOSIS — F32A Depression, unspecified: Secondary | ICD-10-CM

## 2022-06-07 DIAGNOSIS — F909 Attention-deficit hyperactivity disorder, unspecified type: Secondary | ICD-10-CM

## 2022-06-07 DIAGNOSIS — Z8489 Family history of other specified conditions: Secondary | ICD-10-CM | POA: Diagnosis not present

## 2022-06-07 DIAGNOSIS — R5383 Other fatigue: Secondary | ICD-10-CM | POA: Diagnosis not present

## 2022-06-07 DIAGNOSIS — Z30431 Encounter for routine checking of intrauterine contraceptive device: Secondary | ICD-10-CM | POA: Diagnosis not present

## 2022-06-07 DIAGNOSIS — Z7189 Other specified counseling: Secondary | ICD-10-CM

## 2022-06-07 LAB — CBC WITH DIFFERENTIAL/PLATELET
Basophils Absolute: 0 10*3/uL (ref 0.0–0.1)
Basophils Relative: 0.4 % (ref 0.0–3.0)
Eosinophils Absolute: 0.1 10*3/uL (ref 0.0–0.7)
Eosinophils Relative: 1 % (ref 0.0–5.0)
HCT: 37.9 % (ref 36.0–46.0)
Hemoglobin: 12.6 g/dL (ref 12.0–15.0)
Lymphocytes Relative: 33.5 % (ref 12.0–46.0)
Lymphs Abs: 2.3 10*3/uL (ref 0.7–4.0)
MCHC: 33.2 g/dL (ref 30.0–36.0)
MCV: 83 fl (ref 78.0–100.0)
Monocytes Absolute: 0.4 10*3/uL (ref 0.1–1.0)
Monocytes Relative: 6.1 % (ref 3.0–12.0)
Neutro Abs: 4.1 10*3/uL (ref 1.4–7.7)
Neutrophils Relative %: 59 % (ref 43.0–77.0)
Platelets: 310 10*3/uL (ref 150.0–400.0)
RBC: 4.56 Mil/uL (ref 3.87–5.11)
RDW: 13.7 % (ref 11.5–15.5)
WBC: 6.9 10*3/uL (ref 4.0–10.5)

## 2022-06-07 LAB — COMPREHENSIVE METABOLIC PANEL
ALT: 16 U/L (ref 0–35)
AST: 15 U/L (ref 0–37)
Albumin: 4.4 g/dL (ref 3.5–5.2)
Alkaline Phosphatase: 57 U/L (ref 39–117)
BUN: 11 mg/dL (ref 6–23)
CO2: 28 mEq/L (ref 19–32)
Calcium: 9.4 mg/dL (ref 8.4–10.5)
Chloride: 102 mEq/L (ref 96–112)
Creatinine, Ser: 0.8 mg/dL (ref 0.40–1.20)
GFR: 92.43 mL/min (ref >60.00)
Glucose, Bld: 98 mg/dL (ref 70–99)
Potassium: 4 mEq/L (ref 3.5–5.1)
Sodium: 138 mEq/L (ref 135–145)
Total Bilirubin: 0.6 mg/dL (ref 0.2–1.2)
Total Protein: 7.3 g/dL (ref 6.0–8.3)

## 2022-06-07 LAB — TSH: TSH: 1.87 u[IU]/mL (ref 0.35–5.50)

## 2022-06-07 MED ORDER — AMPHETAMINE-DEXTROAMPHETAMINE 20 MG PO TABS: 20.0000 mg | ORAL_TABLET | Freq: Two times a day (BID) | ORAL | 0 refills | Status: DC

## 2022-06-07 MED ORDER — FLUOXETINE HCL 20 MG PO CAPS: 20.0000 mg | ORAL_CAPSULE | Freq: Every day | ORAL | 1 refills | Status: DC

## 2022-06-07 NOTE — Patient Instructions (Addendum)
Let me know if you don't get a call about the gynecology referral.   Take care.  Glad to see you. Go to the lab on the way out.   If you have mychart we'll likely use that to update you.

## 2022-06-07 NOTE — Telephone Encounter (Signed)
PA started in [email or web address] for Amphetamine-Dextroamphetamine 20MG tablets. Key: [id number]; awaiting determination.

## 2022-06-07 NOTE — Telephone Encounter (Signed)
PA has been approved from 06/07/2022 - 06/07/2025.

## 2022-06-07 NOTE — Progress Notes (Signed)
Son had CSF leak and was diagnosed with EoE/asthma.  Parents of the patient and her son d were x'd with renal disease.  Patient recently stopped working to care for her son.    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  FH thyroid disease.  Discussed testing for B27. D/w pt about getting initial labs.  Her mother has rheum f/u pending and that may influence the patient's situation at that point.  Father in law recently died, condolences offered.    Fatigue, nail changes noted.  H/o psoriasis noted.  See above regarding B27 testing.  Noted that father had sudden hearing loss w/o clear source.  She does not have similar symptoms.  Anxiety/depression d/w pt. stressors above discussed with patient.  History of ADD d/w pt.  Still on adderall, with some relief from medication, but not enough effect.  No SI/HI.    Wanted to see GYN about possible IUD.  D/w pt.  She has h/o heavy menses at baseline and IUD may help with that.    Living will d/w pt. Mother designated if patient were incapacitated.   Meds, vitals, and allergies reviewed.   ROS: Per HPI unless specifically indicated in ROS section   GEN: nad, alert and oriented HEENT: ncat NECK: supple w/o LA CV: rrr PULM: ctab, no inc wob ABD: soft, +bs EXT: no edema SKIN: no acute rash  30 minutes were devoted to patient care in this encounter (this includes time spent reviewing the patient's file/history, interviewing and examining the patient, counseling/reviewing plan with patient).

## 2022-06-10 ENCOUNTER — Encounter: Payer: Self-pay | Admitting: Family Medicine

## 2022-06-10 DIAGNOSIS — Z30431 Encounter for routine checking of intrauterine contraceptive device: Secondary | ICD-10-CM | POA: Insufficient documentation

## 2022-06-10 DIAGNOSIS — Z8489 Family history of other specified conditions: Secondary | ICD-10-CM

## 2022-06-10 HISTORY — DX: Family history of other specified conditions: Z84.89

## 2022-06-10 NOTE — Assessment & Plan Note (Signed)
Refer to gynecology.

## 2022-06-10 NOTE — Assessment & Plan Note (Signed)
History of ADD noted, with less effect from current dose of Adderall.  Anxiety likely exacerbated by recent stressors above. Reasonable to increase her dose of adderall and add on prozac.  Routine medication cautions given to patient. I asked her to update me about her situation in about 2 weeks.

## 2022-06-10 NOTE — Assessment & Plan Note (Signed)
See notes on labs. 

## 2022-06-10 NOTE — Assessment & Plan Note (Signed)
Living will d/w pt. Mother designated if patient were incapacitated.

## 2022-06-11 LAB — HLA-B27 ANTIGEN: HLA-B27 Antigen: NEGATIVE

## 2022-06-15 DIAGNOSIS — Z419 Encounter for procedure for purposes other than remedying health state, unspecified: Secondary | ICD-10-CM | POA: Diagnosis not present

## 2022-06-29 ENCOUNTER — Ambulatory Visit: Payer: BC Managed Care – PPO | Admitting: Family

## 2022-06-29 ENCOUNTER — Encounter: Payer: Self-pay | Admitting: Family

## 2022-06-29 VITALS — BP 110/76 | HR 84 | Temp 97.6°F | Ht 59.5 in | Wt 181.4 lb

## 2022-06-29 DIAGNOSIS — R059 Cough, unspecified: Secondary | ICD-10-CM | POA: Diagnosis not present

## 2022-06-29 DIAGNOSIS — J020 Streptococcal pharyngitis: Secondary | ICD-10-CM

## 2022-06-29 HISTORY — DX: Streptococcal pharyngitis: J02.0

## 2022-06-29 LAB — POCT INFLUENZA A/B
Influenza A, POC: NEGATIVE
Influenza B, POC: NEGATIVE

## 2022-06-29 LAB — POC COVID19 BINAXNOW: SARS Coronavirus 2 Ag: NEGATIVE

## 2022-06-29 LAB — POCT RAPID STREP A (OFFICE): Rapid Strep A Screen: POSITIVE — AB

## 2022-06-29 MED ORDER — AMOXICILLIN-POT CLAVULANATE 875-125 MG PO TABS: 1.0000 | ORAL_TABLET | Freq: Two times a day (BID) | ORAL | 0 refills | Status: DC

## 2022-06-29 NOTE — Progress Notes (Signed)
Established Patient Office Visit  Subjective:   Patient ID: Crystal Hammond, female    DOB: [birth year]  Age: 42 y.o. MRN: [id number]  CC:  Chief Complaint  Patient presents with   Sinus Problem    HPI: Crystal Hammond is a 42 y.o. female presenting today for "sinus problem."  3 days ago, patient states that her sinuses started to flare up. Reports sore throat/irritation, "face hurts",  nasal congestion, rhinorrhea, and decreased appetite. Patient has taken Claritin, Zyrtec, and Flonase for sinus symptoms with no improvement. Is also taking OTC Tylenol for pain with mild improvement. Denies ear pressure/pain, chest congestion, chest pain, shortness of breath, nausea, vomiting, diarrhea, constipation, fever, and chill. Does report breaking out in a sweat this morning.       ROS: Negative unless specifically indicated above in HPI.   Relevant past medical history reviewed and updated as indicated.   Allergies and medications reviewed and updated.   Current Outpatient Medications:    albuterol (PROVENTIL) (2.5 MG/3ML) 0.083% nebulizer solution, Inhale 3 mLs (2.5 mg total) by nebulization every 6 (six) hours as needed for wheezing or shortness of breath., Disp: 150 mL, Rfl: 1   albuterol (VENTOLIN HFA) 108 (90 Base) MCG/ACT inhaler, Inhale 2 puffs into the lungs every 6 (six) hours as needed for wheezing or shortness of breath., Disp: 18 g, Rfl: 0   amoxicillin-clavulanate (AUGMENTIN) 875-125 MG tablet, Take 1 tablet by mouth 2 (two) times daily., Disp: 20 tablet, Rfl: 0   amphetamine-dextroamphetamine (ADDERALL) 20 MG tablet, Take 1 tablet (20 mg total) by mouth 2 (two) times daily., Disp: 60 tablet, Rfl: 0   FLUoxetine (PROZAC) 20 MG capsule, Take 1 capsule (20 mg total) by mouth daily., Disp: 90 capsule, Rfl: 1   fluticasone-salmeterol (ADVAIR DISKUS) 250-50 MCG/ACT AEPB, Inhale 1 puff into the lungs in the morning and at bedtime., Disp: 60 each, Rfl: 11  Allergies  Allergen  Reactions   Other Swelling    SHELLFISH    Phenazopyridine Hives and Rash   Benadryl [Diphenhydramine]    Prednisone     Intolerant- skin feels hot   Zomig [Zolmitriptan]     Diffuse aches.      Objective:   BP 110/76   Pulse 84   Temp 97.6 F (36.4 C) (Temporal)   Ht 4' 11.5" (1.511 m)   Wt 181 lb 6.4 oz (82.3 kg)   SpO2 99%   BMI 36.03 kg/m    Physical Exam Vitals and nursing note reviewed.  Constitutional:      Appearance: Normal appearance. She is obese.  HENT:     Head: Normocephalic.     Right Ear: Hearing, ear canal and external ear normal. No drainage, swelling or tenderness. A middle ear effusion is present.     Left Ear: Hearing, ear canal and external ear normal. Tenderness present. No drainage or swelling. A middle ear effusion is present.     Nose: Mucosal edema and congestion present.     Right Turbinates: Swollen.     Left Turbinates: Swollen.     Right Sinus: Maxillary sinus tenderness and frontal sinus tenderness present.     Left Sinus: Maxillary sinus tenderness and frontal sinus tenderness present.     Mouth/Throat:     Mouth: Mucous membranes are dry.     Pharynx: Pharyngeal swelling and posterior oropharyngeal erythema present. No oropharyngeal exudate.  Eyes:     Extraocular Movements: Extraocular movements intact.     Conjunctiva/sclera: Conjunctivae normal.  Pupils: Pupils are equal, round, and reactive to light.  Cardiovascular:     Rate and Rhythm: Normal rate and regular rhythm.  Pulmonary:     Effort: Pulmonary effort is normal.     Breath sounds: Normal breath sounds.  Skin:    General: Skin is warm and dry.  Neurological:     General: No focal deficit present.     Mental Status: She is alert and oriented to person, place, and time.  Psychiatric:        Mood and Affect: Mood normal.        Behavior: Behavior normal.        Thought Content: Thought content normal.        Judgment: Judgment normal.    Assessment & Plan:   Strep pharyngitis Assessment & Plan: Rapid Strep positive, COVID and Flu negative. Rx sent to patient's pharmacy for Augmentin BID Advised patient to continue supportive care for sinus symptoms OTC Dayquil/Nyquil, OTC Ibuprofen for pain as needed, and warm compress to jaw/neck for pain. Patient to notify provider if symptoms fail to improve/worsen, or if fever/chills develop.  I evaluated the patient,  was consulted regarding plans for treatment of care, and agree with the assessment and plan per Joya Gaskins, RN, DNP student.  Eugenia Pancoast, FNP-C     Orders: -     Amoxicillin-Pot Clavulanate; Take 1 tablet by mouth 2 (two) times daily.  Dispense: 20 tablet; Refill: 0  Cough, unspecified type -     POCT Influenza A/B -     POCT rapid strep A -     POC COVID-19 BinaxNow   Follow up plan: Return for f/u with primary care provider if no improvement.  Eugenia Pancoast, FNP AGNP-student

## 2022-06-29 NOTE — Progress Notes (Signed)
Established Patient Office Visit  Subjective:   Patient ID: Crystal Hammond, female    DOB: 12/12/1980  Age: 42 y.o. MRN: [id number]  CC:  Chief Complaint  Patient presents with   Sinus Problem    HPI: Crystal Hammond is a 42 y.o. female presenting on 06/29/2022 for Sinus Problem  C/o sinus pressure, nasal congestion, sore throat and tooth pain.  Taking flonase and claritin and tylenol without improvement. No fever or chills but did have sweats this am. Did have covid exposure two weeks ago.         ROS: Negative unless specifically indicated above in HPI.   Relevant past medical history reviewed and updated as indicated.   Allergies and medications reviewed and updated.   Current Outpatient Medications:    albuterol (PROVENTIL) (2.5 MG/3ML) 0.083% nebulizer solution, Inhale 3 mLs (2.5 mg total) by nebulization every 6 (six) hours as needed for wheezing or shortness of breath., Disp: 150 mL, Rfl: 1   albuterol (VENTOLIN HFA) 108 (90 Base) MCG/ACT inhaler, Inhale 2 puffs into the lungs every 6 (six) hours as needed for wheezing or shortness of breath., Disp: 18 g, Rfl: 0   amoxicillin-clavulanate (AUGMENTIN) 875-125 MG tablet, Take 1 tablet by mouth 2 (two) times daily., Disp: 20 tablet, Rfl: 0   amphetamine-dextroamphetamine (ADDERALL) 20 MG tablet, Take 1 tablet (20 mg total) by mouth 2 (two) times daily., Disp: 60 tablet, Rfl: 0   FLUoxetine (PROZAC) 20 MG capsule, Take 1 capsule (20 mg total) by mouth daily., Disp: 90 capsule, Rfl: 1   fluticasone-salmeterol (ADVAIR DISKUS) 250-50 MCG/ACT AEPB, Inhale 1 puff into the lungs in the morning and at bedtime., Disp: 60 each, Rfl: 11  Allergies  Allergen Reactions   Other Swelling    SHELLFISH    Phenazopyridine Hives and Rash   Benadryl [Diphenhydramine]    Prednisone     Intolerant- skin feels hot   Zomig [Zolmitriptan]     Diffuse aches.      Objective:   BP 110/76   Pulse 84   Temp 97.6 F (36.4 C)  (Temporal)   Ht 4' 11.5" (1.511 m)   Wt 181 lb 6.4 oz (82.3 kg)   SpO2 99%   BMI 36.03 kg/m    Physical Exam Constitutional:      General: She is not in acute distress.    Appearance: Normal appearance. She is obese. She is not ill-appearing, toxic-appearing or diaphoretic.  HENT:     Head: Normocephalic.     Right Ear: Tympanic membrane normal.     Left Ear: Tympanic membrane normal.     Nose: Congestion present.     Right Sinus: Frontal sinus tenderness present.     Left Sinus: Frontal sinus tenderness present.     Mouth/Throat:     Mouth: Mucous membranes are dry.     Pharynx: Posterior oropharyngeal erythema present. No oropharyngeal exudate.  Eyes:     Extraocular Movements: Extraocular movements intact.     Pupils: Pupils are equal, round, and reactive to light.  Cardiovascular:     Rate and Rhythm: Normal rate and regular rhythm.     Pulses: Normal pulses.     Heart sounds: Normal heart sounds.  Pulmonary:     Effort: Pulmonary effort is normal.     Breath sounds: Normal breath sounds.  Musculoskeletal:     Cervical back: Normal range of motion.  Lymphadenopathy:     Cervical: Cervical adenopathy present.  Right cervical: Superficial cervical adenopathy present.     Left cervical: Superficial cervical adenopathy present.  Neurological:     General: No focal deficit present.     Mental Status: She is alert and oriented to person, place, and time. Mental status is at baseline.  Psychiatric:        Mood and Affect: Mood normal.        Behavior: Behavior normal.        Thought Content: Thought content normal.        Judgment: Judgment normal.     Assessment & Plan:  Strep pharyngitis Assessment & Plan: Rapid Strep positive, COVID and Flu negative. Rx sent to patient's pharmacy for Augmentin BID Advised patient to continue supportive care for sinus symptoms OTC Dayquil/Nyquil, OTC Ibuprofen for pain as needed, and warm compress to jaw/neck for pain. Patient  to notify provider if symptoms fail to improve/worsen, or if fever/chills develop.  I evaluated the patient,  was consulted regarding plans for treatment of care, and agree with the assessment and plan per Joya Gaskins, RN, DNP student.  Crystal Pancoast, FNP-C     Orders: -     Amoxicillin-Pot Clavulanate; Take 1 tablet by mouth 2 (two) times daily.  Dispense: 20 tablet; Refill: 0  Cough, unspecified type -     POCT Influenza A/B -     POCT rapid strep A -     POC COVID-19 BinaxNow     Follow up plan: Return for f/u with primary care provider if no improvement.  Crystal Pancoast, FNP

## 2022-06-29 NOTE — Patient Instructions (Signed)
------------------------------------    You were found to be strep positive,  Take antibiotics that have been sent to the pharmacy.  Change your toothbrush after 24 hours on the antibiotics.  Gargle with warm salt water as needed for sore throat.   ------------------------------------  

## 2022-06-29 NOTE — Assessment & Plan Note (Addendum)
Rapid Strep positive, COVID and Flu negative. Rx sent to patient's pharmacy for Augmentin BID Advised patient to continue supportive care for sinus symptoms OTC Dayquil/Nyquil, OTC Ibuprofen for pain as needed, and warm compress to jaw/neck for pain. Patient to notify provider if symptoms fail to improve/worsen, or if fever/chills develop.  I evaluated the patient,  was consulted regarding plans for treatment of care, and agree with the assessment and plan per Joya Gaskins, RN, DNP student.  -Eugenia Pancoast, FNP-C

## 2022-07-09 ENCOUNTER — Other Ambulatory Visit: Payer: Self-pay | Admitting: Family Medicine

## 2022-07-09 NOTE — Telephone Encounter (Signed)
Refill request for amphetamine-dextroamphetamine (ADDERALL) 20 MG tablet   LOV - 06/07/22 Next OV - not scheduled Last refill - 06/07/22 #60/0

## 2022-07-10 MED ORDER — AMPHETAMINE-DEXTROAMPHETAMINE 20 MG PO TABS: 20.0000 mg | ORAL_TABLET | Freq: Two times a day (BID) | ORAL | 0 refills | Status: DC

## 2022-07-10 NOTE — Telephone Encounter (Signed)
Please get update on patient re: mood.  Rx sent. Thanks.

## 2022-07-11 NOTE — Telephone Encounter (Signed)
Called and spoke with patient she states she felt like she was doing better when she first started taking the fluoxetine and then slowly started to go back to what she feels like was her baseline. She scheduled 3 mo f/u in June.

## 2022-07-15 MED ORDER — FLUOXETINE HCL 20 MG PO CAPS: 20.0000 mg | ORAL_CAPSULE | Freq: Every day | ORAL | Status: DC

## 2022-07-15 NOTE — Telephone Encounter (Signed)
I would try inc in prozac to 40mg  a day and see if that doesn't help over the next few weeks.  If worse in the meantime, then please let me know.  Thanks.

## 2022-07-16 DIAGNOSIS — Z419 Encounter for procedure for purposes other than remedying health state, unspecified: Secondary | ICD-10-CM | POA: Diagnosis not present

## 2022-08-02 IMAGING — CR DG CHEST 2V
1 series · 2 of 2 positions shown · non-contrast
Comparison: 12/06/2020

CLINICAL DATA: Recent COVID pneumonia. Cardiomegaly on prior
radiograph. Asymptomatic currently.

EXAM:
CHEST - 2 VIEW

[Series 1: dg chest 2 view · 0.14mm/px · 2 of 2 slices shown]
[im 1/2]
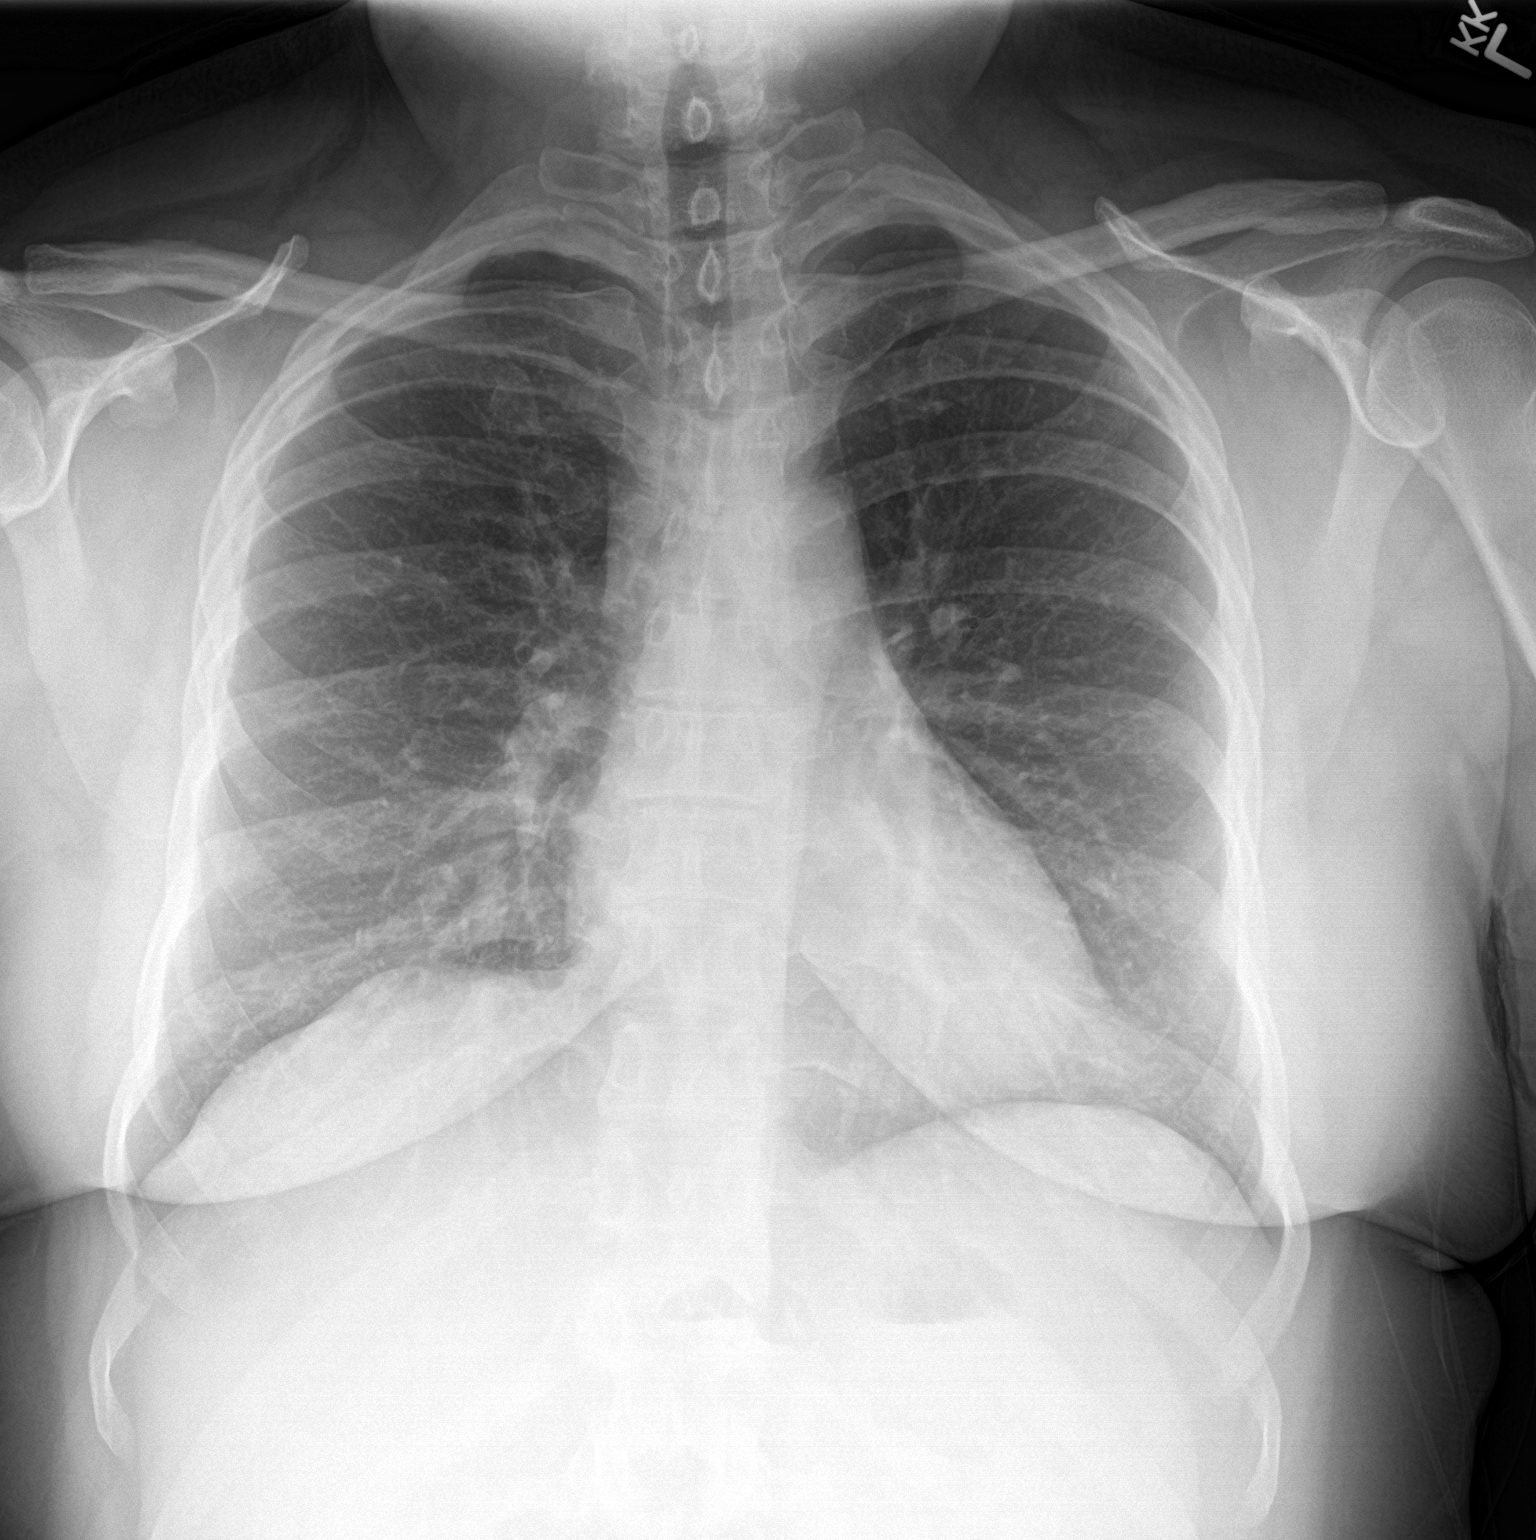
[im 2/2]
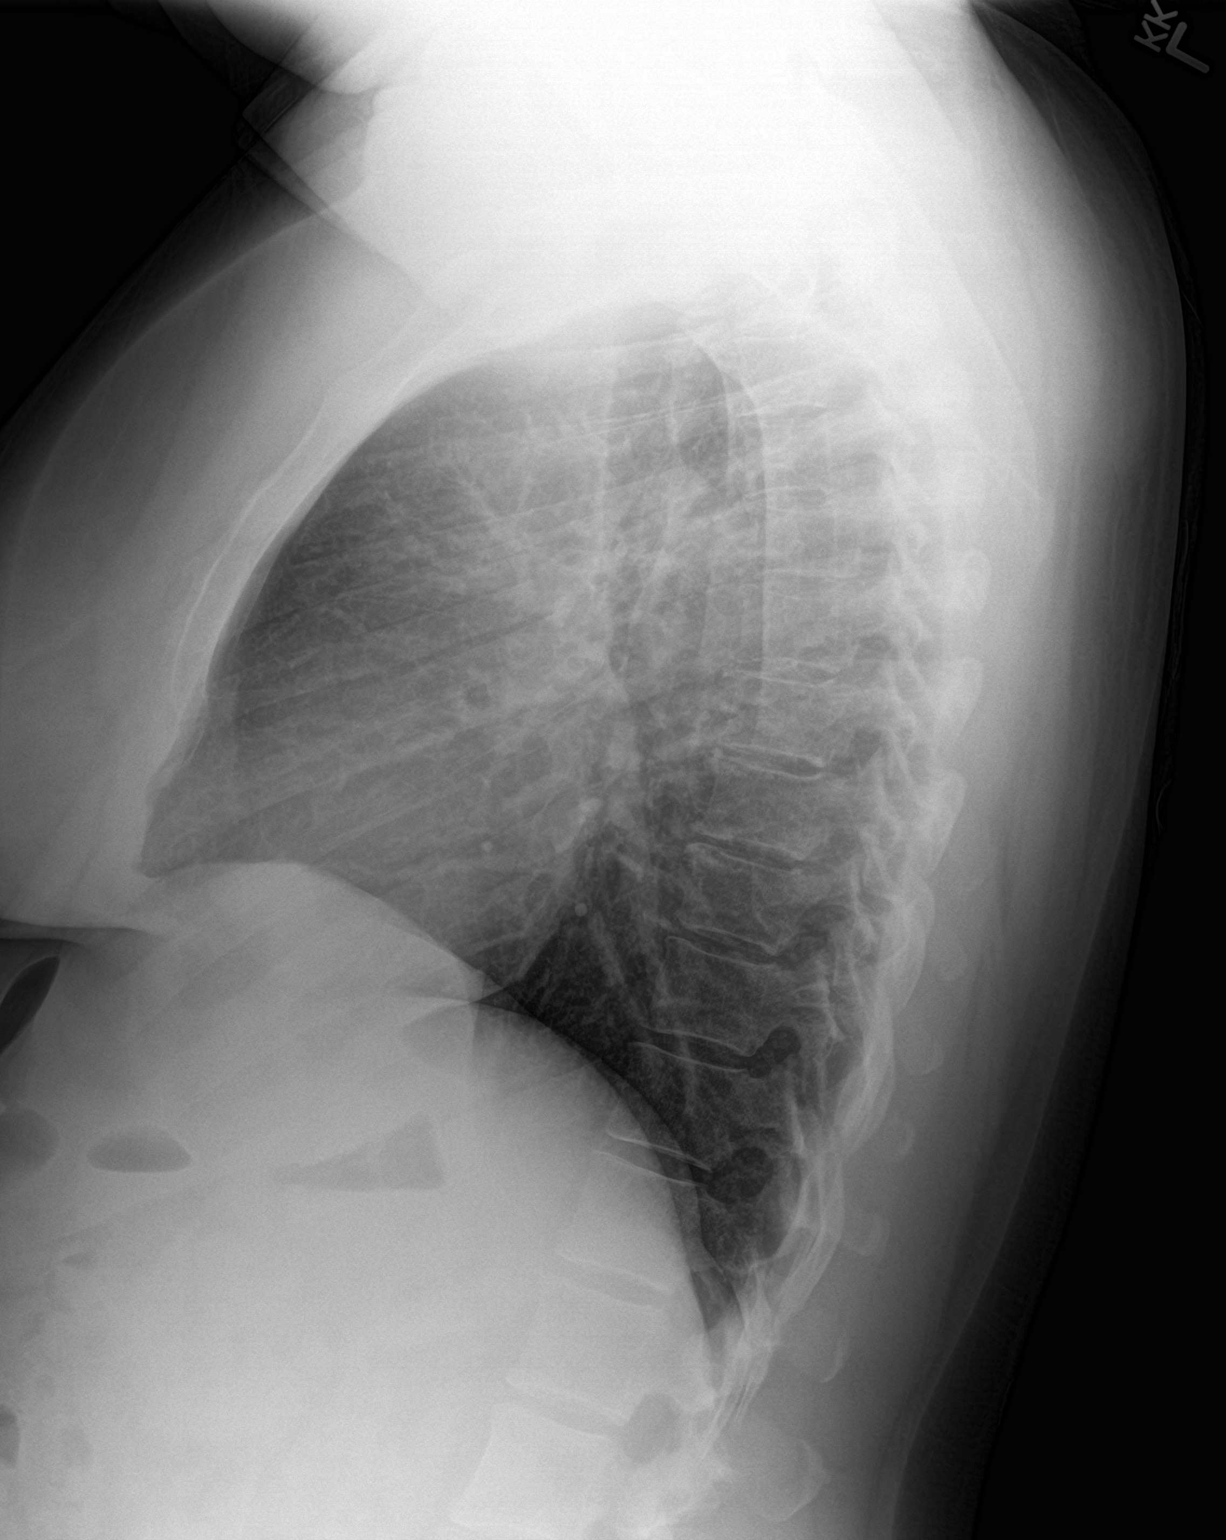

[2 of 2 positions shown; findings below may reference images not displayed]

FINDINGS: Midline trachea. Normal heart size and mediastinal contours. No
pleural effusion or pneumothorax. Mild right hemidiaphragm
elevation. Clear lungs.
IMPRESSION: No acute cardiopulmonary disease.  No evidence of cardiomegaly.

## 2022-08-10 ENCOUNTER — Encounter: Payer: Self-pay | Admitting: Family Medicine

## 2022-08-10 ENCOUNTER — Ambulatory Visit (INDEPENDENT_AMBULATORY_CARE_PROVIDER_SITE_OTHER): Payer: BC Managed Care – PPO | Admitting: Family Medicine

## 2022-08-10 VITALS — BP 110/42 | HR 85 | Temp 98.0°F | Ht 59.5 in | Wt 176.0 lb

## 2022-08-10 DIAGNOSIS — F32A Depression, unspecified: Secondary | ICD-10-CM

## 2022-08-10 DIAGNOSIS — J020 Streptococcal pharyngitis: Secondary | ICD-10-CM | POA: Diagnosis not present

## 2022-08-10 DIAGNOSIS — J029 Acute pharyngitis, unspecified: Secondary | ICD-10-CM

## 2022-08-10 LAB — POCT RAPID STREP A (OFFICE): Rapid Strep A Screen: POSITIVE — AB

## 2022-08-10 MED ORDER — FLUOXETINE HCL 40 MG PO CAPS: 40.0000 mg | ORAL_CAPSULE | Freq: Every day | ORAL | 1 refills | Status: DC

## 2022-08-10 MED ORDER — AMOXICILLIN-POT CLAVULANATE 875-125 MG PO TABS: 1.0000 | ORAL_TABLET | Freq: Two times a day (BID) | ORAL | 0 refills | Status: DC

## 2022-08-10 MED ORDER — AMPHETAMINE-DEXTROAMPHETAMINE 20 MG PO TABS: 20.0000 mg | ORAL_TABLET | Freq: Two times a day (BID) | ORAL | 0 refills | Status: DC

## 2022-08-10 NOTE — Progress Notes (Unsigned)
She had positive RST in 06/2022.  Treated.  She got better, back to baseline.  She was able to finish the augmentin rx.    Sx returned in the last 3 days.  ST.  W/ trouble swallowing, cough, ear pain, decrease in appetite and energy x 4 days. Patient has taken honey, has been drinking tea, took OTC sinus meds, tylenol and ibuprofen.  This feels similar to episode last month.    Due for adderall rx refill.  It helps.  Still on prozac at baseline.  Had effect initially, that levelled off.  Still with irritability.  No ADE on med.  No SI/HI.    Meds, vitals, and allergies reviewed.   ROS: Per HPI unless specifically indicated in ROS section   Nad Ncat TM wnl MMM, OP with posterior erythema.  Neck supple, no LA Rrr Ctab Abd soft, not ttp Skin well perfused.   RST faintly positive, dw pt.

## 2022-08-10 NOTE — Patient Instructions (Signed)
Start augmentin.  When better, increase prozac to 40mg  and let me know if not helpful/tolerated.  Take care.  Glad to see you.

## 2022-08-12 DIAGNOSIS — J029 Acute pharyngitis, unspecified: Secondary | ICD-10-CM | POA: Insufficient documentation

## 2022-08-12 NOTE — Assessment & Plan Note (Signed)
Restart Augmentin.  Supportive care.  Update [place] as needed.

## 2022-08-12 NOTE — Assessment & Plan Note (Signed)
Will treat strep throat first. When better, increase prozac to 40mg  and she can let me know if not helpful/tolerated.

## 2022-08-15 DIAGNOSIS — Z419 Encounter for procedure for purposes other than remedying health state, unspecified: Secondary | ICD-10-CM | POA: Diagnosis not present

## 2022-08-17 ENCOUNTER — Encounter: Payer: Self-pay | Admitting: Family Medicine

## 2022-08-17 ENCOUNTER — Other Ambulatory Visit (HOSPITAL_COMMUNITY)
Admission: RE | Admit: 2022-08-17 | Discharge: 2022-08-17 | Disposition: A | Discharge status: Home / Self Care | Payer: BC Managed Care – PPO | Source: Ambulatory Visit | Attending: Family Medicine | Admitting: Family Medicine

## 2022-08-17 ENCOUNTER — Ambulatory Visit (INDEPENDENT_AMBULATORY_CARE_PROVIDER_SITE_OTHER): Payer: BC Managed Care – PPO | Admitting: Family Medicine

## 2022-08-17 VITALS — BP 114/75 | HR 79 | Wt 177.0 lb

## 2022-08-17 DIAGNOSIS — Z3202 Encounter for pregnancy test, result negative: Secondary | ICD-10-CM | POA: Diagnosis not present

## 2022-08-17 DIAGNOSIS — Z98891 History of uterine scar from previous surgery: Secondary | ICD-10-CM

## 2022-08-17 DIAGNOSIS — N920 Excessive and frequent menstruation with regular cycle: Secondary | ICD-10-CM | POA: Diagnosis present

## 2022-08-17 DIAGNOSIS — N939 Abnormal uterine and vaginal bleeding, unspecified: Secondary | ICD-10-CM

## 2022-08-17 DIAGNOSIS — Z30430 Encounter for insertion of intrauterine contraceptive device: Secondary | ICD-10-CM

## 2022-08-17 DIAGNOSIS — Z1339 Encounter for screening examination for other mental health and behavioral disorders: Secondary | ICD-10-CM

## 2022-08-17 DIAGNOSIS — Z8742 Personal history of other diseases of the female genital tract: Secondary | ICD-10-CM | POA: Diagnosis not present

## 2022-08-17 LAB — POCT URINE PREGNANCY: Preg Test, Ur: NEGATIVE

## 2022-08-17 MED ORDER — LEVONORGESTREL 20 MCG/DAY IU IUD
1.0000 | INTRAUTERINE_SYSTEM | Freq: Once | INTRAUTERINE | Status: AC
  Administered 2022-08-17: 1 via INTRAUTERINE

## 2022-08-17 NOTE — Progress Notes (Signed)
NGYN and Discuss Contraception. PHQ-9=21  GAD 7 = 21 LMP: 08/13/22 Last pap:  02/03/2019 WNL  Contraception: Wants IUD today unprotected intercourse 2-3wks ago. Mammogram: Not yet indicated  UPT : Negative.  CC: Heavy periods , had to wear period under, pads, and a tampon and still leaks on clothes, report clots that is sometimes larger that a dollar coin.

## 2022-08-17 NOTE — Progress Notes (Signed)
GYNECOLOGY PROBLEM  VISIT ENCOUNTER NOTE  Subjective:   Crystal Hammond is a 42 y.o. [id number] female here for a problem GYN visit.  Current complaints: heavy cycles  Startes menses at age 42, had irregular cycles and used clomid x3 to achieve pregnancy. After 3rd pregnancy her cycles were irregular and she became spontaneously pregnant with her 4th child. She reports after that birth 4 yr ago her cycles have regulated and are monthly.    Cyles are < 7day but heavy using tampin, pad and period underwear with continued leakage. Reports clots but they are < golfball size. She reports no dizziness or lightheaded sx and last HGB in Feb 2024 (reviewed in Epic) was 12.6.  She reports she previously had a Mirena and this helped with her heavy cycles. Her cycles have always been heavy since starting menses. They are slightly heavier now but are consistent with her periods in her 20s except for they are regular now.   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  Her family history is s/f her mother having fibroids  Denies abnormal vaginal bleeding, discharge, pelvic pain, problems with intercourse or other gynecologic concerns.    Gynecologic History   Contraception: condoms and vasectomy  There are no preventive care reminders to display for this patient.  The following portions of the patient's history were reviewed and updated as appropriate: allergies, current medications, past family history, past medical history, past social history, past surgical history and problem list.  Review of Systems Pertinent items are noted in HPI.   Objective:  BP 114/75   Pulse 79   Wt 177 lb (80.3 kg)   LMP 08/13/2022 (Exact Date)   BMI 35.15 kg/m  Gen: well appearing, NAD HEENT: no scleral icterus CV: RR Lung: Normal WOB Ext: warm well perfused  PELVIC: Normal appearing external genitalia; normal appearing vaginal mucosa and cervix.  No abnormal discharge noted.  Pap smear obtained.   Anterior uterus, Normal uterine size, no other palpable masses, no uterine or adnexal tenderness.  IUD Insertion Procedure Note Patient identified, informed consent performed, consent signed.   Discussed risks of irregular bleeding, cramping, infection, malpositioning or misplacement of the IUD outside the uterus which may require further procedure such as laparoscopy. Time out was performed.  Urine pregnancy test negative.  Speculum placed in the vagina.  Cervix visualized.  Cleaned with Betadine x 2.  Grasped anteriorly with a single tooth tenaculum.  Uterus sounded to 6 cm.  IUD placed per manufacturer's recommendations.  Strings trimmed to 3 cm. Tenaculum was removed, good hemostasis noted.  Patient tolerated procedure well.    Assessment and Plan:  1. Abnormal uterine bleeding (AUB) - POCT urine pregnancy - Cytology - PAP - levonorgestrel (MIRENA) 20 MCG/DAY IUD 1 each - US PELVIS TRANSVAGINAL NON-OB (TV ONLY); Future  2. Menorrhagia with regular cycle Discussed management of AUB/menorrhagia Patient desires IUD Recommended [place] to assess for fibroids.  - POCT urine pregnancy - Cytology - PAP - levonorgestrel (MIRENA) 20 MCG/DAY IUD 1 each - US PELVIS TRANSVAGINAL NON-OB (TV ONLY); Future  3. History of abnormal cervical Pap smear In 2017, with normal in 2020  4. Hx of cesarean section CSx4  5. Encounter for IUD insertion Patient was given post-procedure instructions.  She was advised to have backup contraception for one week.  Patient was also asked to check IUD strings periodically and follow up in 4 weeks for IUD check.   Please refer to After Visit Summary for  other counseling recommendations.   Return in about 1 year (around 08/17/2023) for Yearly wellness exam.  Federico Flake, MD, MPH, ABFM Attending Physician Faculty Practice- Center for [facility]

## 2022-08-22 ENCOUNTER — Ambulatory Visit
Admission: RE | Admit: 2022-08-22 | Discharge: 2022-08-22 | Disposition: A | Discharge status: Home / Self Care | Payer: BC Managed Care – PPO | Source: Ambulatory Visit | Attending: Family Medicine | Admitting: Family Medicine

## 2022-08-22 DIAGNOSIS — N920 Excessive and frequent menstruation with regular cycle: Secondary | ICD-10-CM | POA: Diagnosis present

## 2022-08-22 DIAGNOSIS — N939 Abnormal uterine and vaginal bleeding, unspecified: Secondary | ICD-10-CM | POA: Insufficient documentation

## 2022-08-23 ENCOUNTER — Encounter: Payer: Self-pay | Admitting: Family Medicine

## 2022-08-23 LAB — CYTOLOGY - PAP
Adequacy: ABSENT
Comment: NEGATIVE
Diagnosis: UNDETERMINED — AB
High risk HPV: NEGATIVE

## 2022-08-24 ENCOUNTER — Encounter: Payer: Self-pay | Admitting: Family Medicine

## 2022-08-24 DIAGNOSIS — N809 Endometriosis, unspecified: Secondary | ICD-10-CM | POA: Insufficient documentation

## 2022-08-24 DIAGNOSIS — Z8632 Personal history of gestational diabetes: Secondary | ICD-10-CM | POA: Insufficient documentation

## 2022-08-27 ENCOUNTER — Encounter: Payer: Self-pay | Admitting: Family Medicine

## 2022-09-07 ENCOUNTER — Ambulatory Visit (INDEPENDENT_AMBULATORY_CARE_PROVIDER_SITE_OTHER): Payer: BC Managed Care – PPO | Admitting: Family Medicine

## 2022-09-07 ENCOUNTER — Encounter: Payer: Self-pay | Admitting: Family Medicine

## 2022-09-07 VITALS — BP 122/73 | HR 90 | Wt 179.0 lb

## 2022-09-07 DIAGNOSIS — Z30431 Encounter for routine checking of intrauterine contraceptive device: Secondary | ICD-10-CM

## 2022-09-07 DIAGNOSIS — N920 Excessive and frequent menstruation with regular cycle: Secondary | ICD-10-CM

## 2022-09-07 DIAGNOSIS — Z30430 Encounter for insertion of intrauterine contraceptive device: Secondary | ICD-10-CM

## 2022-09-07 NOTE — Progress Notes (Signed)
   GYNECOLOGY CLINIC- IUD STRING CHECK PROGRESS NOTE  History:  42 y.o. [id number] here today for today for IUD string check; Mirena was placed  08/17/22. No complaints about the Mirena, no concerning side effects. She had an [place] for other reasons that showed a possible cervical placement of IUD.  The [place] was ordered to check for fibroids that might contribute to her heavier cycles. Patient reports normal cramping after procedure and some bleeding now but she iat her typical menses time of the month. Describes the bleeding as light and only with wiping.    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 She had an IUD placed for menorrhagia.  The following portions of the patient's history were reviewed and updated as appropriate: allergies, current medications, past family history, past medical history, past social history, past surgical history and problem list. Last pap smear on 08/17/22 was ASCUS,  HRHPV.  Review of Systems:  Pertinent items are noted in HPI.   Objective:  Physical Exam Blood pressure 122/73, pulse 90, weight 179 lb (81.2 kg), last menstrual period 08/13/2022. Gen: NAD Abd: Soft, nontender and nondistended Pelvic: Normal appearing external genitalia; normal appearing vaginal mucosa and cervix.  IUD strings visualized, about 2-3 cm in length outside cervix. The device was NOT seen at the external os. +vaginal bleeding consistent with breakthrough bleeding. I used a fox swab to palpate the cervix which was non=tender  Assessment & Plan:  Normal IUD check externally Reviewed options to remove/replace today. Patient would like to avoid this if possible. We discussed repeat US in 8-10 weeks to help check position. Based on my exam and the patient's lack of symptoms I am not concerned about full expulsion.  There is still possibility of malposition even with normal exam and we discussed this.  Discussed endometrial ablation as a possibility for bleeding management We r/o  fibroids with [place] which was the indication for her [place].Marland Kitchen   Patient to keep IUD in place for five years; can come in for removal if she desires pregnancy within the next five years. Routine preventative health maintenance measures emphasized  Federico Flake, MD  Faculty Practice  Center for [facility], [facility]

## 2022-09-07 NOTE — Progress Notes (Signed)
CC: String check IUD, Mirena placed on 08/17/22  Pt stating that she had [place] and showed that IUD had already shifted some  Pt also stating that she is crampy

## 2022-09-11 ENCOUNTER — Ambulatory Visit: Payer: BC Managed Care – PPO | Admitting: Family Medicine

## 2022-09-11 ENCOUNTER — Ambulatory Visit (INDEPENDENT_AMBULATORY_CARE_PROVIDER_SITE_OTHER)
Admission: RE | Admit: 2022-09-11 | Discharge: 2022-09-11 | Disposition: A | Discharge status: Home / Self Care | Payer: BC Managed Care – PPO | Source: Ambulatory Visit | Attending: Family Medicine | Admitting: Family Medicine

## 2022-09-11 ENCOUNTER — Encounter: Payer: Self-pay | Admitting: Family Medicine

## 2022-09-11 VITALS — BP 112/72 | HR 90 | Temp 97.9°F | Ht 59.5 in | Wt 179.0 lb

## 2022-09-11 DIAGNOSIS — M549 Dorsalgia, unspecified: Secondary | ICD-10-CM | POA: Diagnosis not present

## 2022-09-11 LAB — POC URINALSYSI DIPSTICK (AUTOMATED)
Bilirubin, UA: NEGATIVE
Blood, UA: 3
Glucose, UA: NEGATIVE
Ketones, UA: NEGATIVE
Leukocytes, UA: NEGATIVE
Nitrite, UA: NEGATIVE
Protein, UA: NEGATIVE
Spec Grav, UA: 1.02 (ref 1.010–1.025)
Urobilinogen, UA: 0.2 E.U./dL
pH, UA: 6 (ref 5.0–8.0)

## 2022-09-11 MED ORDER — TAMSULOSIN HCL 0.4 MG PO CAPS: 0.4000 mg | ORAL_CAPSULE | Freq: Every day | ORAL | 0 refills | Status: DC

## 2022-09-11 MED ORDER — HYDROCODONE-ACETAMINOPHEN 5-325 MG PO TABS: 1.0000 | ORAL_TABLET | Freq: Four times a day (QID) | ORAL | 0 refills | Status: DC | PRN

## 2022-09-11 NOTE — Patient Instructions (Signed)
Plenty of fluids. Hydrocodone for pain.  Start flomax.  Update [place] as needed, if pain isn't improving.  Take care.  Glad to see you.

## 2022-09-11 NOTE — Progress Notes (Unsigned)
Back pain. She had a period recently.  Back pain started around that time.  L lower back pain.  Radiates up the back, not down the leg.  Played tennis last week.  Tried tylenol, ibuprofen, heat, pamprin, epson salt bath.  No fevers.  No chills.  Some nausea, can happen with menses. No dysuria.  No rash.  No bruising. H/o renal stones.    Prev imaging d/w pt.  IMPRESSION: IUD in the cervical canal.  Ovaries not visualized.

## 2022-09-12 DIAGNOSIS — M549 Dorsalgia, unspecified: Secondary | ICD-10-CM | POA: Insufficient documentation

## 2022-09-12 NOTE — Assessment & Plan Note (Signed)
Concern for small renal stone in the midst of passage.  Discussed options.  At this point still okay for outpatient follow-up. Advised to drink plenty of fluids. Hydrocodone for pain.  Start flomax.  Update [place] as needed, if pain isn't improving.  She agrees to plan.

## 2022-09-15 DIAGNOSIS — Z419 Encounter for procedure for purposes other than remedying health state, unspecified: Secondary | ICD-10-CM | POA: Diagnosis not present

## 2022-10-03 ENCOUNTER — Other Ambulatory Visit: Payer: Self-pay | Admitting: Family Medicine

## 2022-10-09 ENCOUNTER — Encounter: Payer: Self-pay | Admitting: Family Medicine

## 2022-10-09 ENCOUNTER — Ambulatory Visit (INDEPENDENT_AMBULATORY_CARE_PROVIDER_SITE_OTHER): Payer: BC Managed Care – PPO | Admitting: Family Medicine

## 2022-10-09 VITALS — BP 116/80 | HR 91 | Temp 98.6°F | Ht 59.5 in | Wt 177.0 lb

## 2022-10-09 DIAGNOSIS — F909 Attention-deficit hyperactivity disorder, unspecified type: Secondary | ICD-10-CM | POA: Diagnosis not present

## 2022-10-09 DIAGNOSIS — F32A Depression, unspecified: Secondary | ICD-10-CM

## 2022-10-09 DIAGNOSIS — R29890 Loss of height: Secondary | ICD-10-CM

## 2022-10-09 MED ORDER — AMPHETAMINE-DEXTROAMPHETAMINE 20 MG PO TABS: 20.0000 mg | ORAL_TABLET | Freq: Two times a day (BID) | ORAL | 0 refills | Status: DC

## 2022-10-09 MED ORDER — BUSPIRONE HCL 5 MG PO TABS: 5.0000 mg | ORAL_TABLET | Freq: Two times a day (BID) | ORAL | 1 refills | Status: DC

## 2022-10-09 NOTE — Patient Instructions (Signed)
Add on 5mg  buspar twice a day.  After 10 days, increase to 10mg  twice a day if needed.    Take care.  Glad to see you.

## 2022-10-09 NOTE — Progress Notes (Unsigned)
Anxiety.  Prozac helps most days, still with fatigue and lower motivation until she is able to get up.  She got through the anniversary of her brother's death.  No SI/HI.  She reported her husband hadn't paid the mortgage and she had to cover that.    Height loss noted by patient.  Her son had eval by endocrine.  She was prev 5 ft, now with loss of 1/2 inch.  She doesn't have back pain.    DXA?  Prev flank pain is better.  Not taking hydrocodone now.  Didn't recall passing a stone.  No blood in urine.    She needed refill on adderall.  Rx sent.

## 2022-10-10 ENCOUNTER — Encounter: Payer: Self-pay | Admitting: Family Medicine

## 2022-10-10 DIAGNOSIS — R29890 Loss of height: Secondary | ICD-10-CM | POA: Insufficient documentation

## 2022-10-10 NOTE — Assessment & Plan Note (Signed)
Bone density test ordered.

## 2022-10-10 NOTE — Assessment & Plan Note (Signed)
Continue Prozac 40 mg.  Add on BuSpar 5 mg twice a day then increase to 10 mg twice a day if needed.  Routine cautions given to patient.  She can update me as needed.

## 2022-10-10 NOTE — Assessment & Plan Note (Signed)
Would continue Adderall as is.

## 2022-10-15 DIAGNOSIS — Z419 Encounter for procedure for purposes other than remedying health state, unspecified: Secondary | ICD-10-CM | POA: Diagnosis not present

## 2022-11-01 ENCOUNTER — Other Ambulatory Visit: Payer: Self-pay | Admitting: Family Medicine

## 2022-11-01 DIAGNOSIS — Z1231 Encounter for screening mammogram for malignant neoplasm of breast: Secondary | ICD-10-CM

## 2022-11-02 ENCOUNTER — Ambulatory Visit
Admission: RE | Admit: 2022-11-02 | Discharge: 2022-11-02 | Disposition: A | Discharge status: Home / Self Care | Payer: BC Managed Care – PPO | Source: Ambulatory Visit | Attending: Family Medicine | Admitting: Family Medicine

## 2022-11-02 DIAGNOSIS — Z30431 Encounter for routine checking of intrauterine contraceptive device: Secondary | ICD-10-CM | POA: Insufficient documentation

## 2022-11-02 DIAGNOSIS — Z30430 Encounter for insertion of intrauterine contraceptive device: Secondary | ICD-10-CM | POA: Insufficient documentation

## 2022-11-05 ENCOUNTER — Encounter: Payer: Self-pay | Admitting: Family Medicine

## 2022-11-05 DIAGNOSIS — Z975 Presence of (intrauterine) contraceptive device: Secondary | ICD-10-CM | POA: Insufficient documentation

## 2022-11-05 NOTE — Progress Notes (Signed)
Images reviewed, IUD on my read appears to be on border of lower uterine segment and cervix, though slightly more into the cervix. Per last note patient has good control of her menorrhagia symptoms and IUD not visible on exam, will offer removal though should be OK for both contraception and bleeding control as is.

## 2022-11-08 ENCOUNTER — Telehealth (INDEPENDENT_AMBULATORY_CARE_PROVIDER_SITE_OTHER): Payer: BC Managed Care – PPO | Admitting: Nurse Practitioner

## 2022-11-08 ENCOUNTER — Encounter: Payer: Self-pay | Admitting: Nurse Practitioner

## 2022-11-08 VITALS — Ht 59.5 in

## 2022-11-08 DIAGNOSIS — R3129 Other microscopic hematuria: Secondary | ICD-10-CM

## 2022-11-08 DIAGNOSIS — R8290 Unspecified abnormal findings in urine: Secondary | ICD-10-CM | POA: Diagnosis not present

## 2022-11-08 DIAGNOSIS — R300 Dysuria: Secondary | ICD-10-CM | POA: Insufficient documentation

## 2022-11-08 LAB — POCT URINALYSIS DIPSTICK
Bilirubin, UA: NEGATIVE
Blood, UA: POSITIVE
Glucose, UA: NEGATIVE
Ketones, UA: NEGATIVE
Leukocytes, UA: NEGATIVE
Nitrite, UA: NEGATIVE
Protein, UA: NEGATIVE
Spec Grav, UA: <=1.005 — AB (ref 1.010–1.025)
Urobilinogen, UA: 0.2 E.U./dL
pH, UA: 6 (ref 5.0–8.0)

## 2022-11-08 MED ORDER — CEPHALEXIN 500 MG PO CAPS: 500.0000 mg | ORAL_CAPSULE | Freq: Two times a day (BID) | ORAL | 0 refills | Status: DC

## 2022-11-08 NOTE — Progress Notes (Signed)
Ph: [phone] Fax: [phone]   Patient ID: Crystal Hammond, female    DOB: Sep 19, 1980, 42 y.o.   MRN: [id number]  Virtual visit completed through MyChart, a video enabled telemedicine application. Due to national recommendations of social distancing due to COVID-19, a virtual visit is felt to be most appropriate for this patient at this time. Reviewed limitations, risks, security and privacy concerns of performing a virtual visit and the availability of in person appointments. I also reviewed that there may be a patient responsible charge related to this service. The patient agreed to proceed.   Patient location: home Provider location: [place] at [facility], office Persons participating in this virtual visit: patient, provider   If any vitals were documented, they were collected by patient at home unless specified below.    Ht 4' 11.5" (1.511 m) Comment: per chart  LMP 10/07/2022 (Exact Date)   BMI 35.15 kg/m    CC: urinary complaints Subjective:   HPI: Crystal Hammond is a 42 y.o. female presenting on 11/08/2022 for Urinary Tract Infection (Started couple days ago, frequent urination, odor and looks like particles in her urine. Constantly feels like she needs to go but can't. Headaches for 4 days. )    States that symoptoms started approx 2-3 days ago She is experiencing frquency, incomplete emptying with urine odor. States that she mainly drinks water. States that there are some particles in it. States that she is doue for her cycle soon. States that she is having some cramping.     Relevant past medical, surgical, family and social history reviewed and updated as indicated. Interim medical history since our last visit reviewed. Allergies and medications reviewed and updated. Outpatient Medications Prior to Visit  Medication Sig Dispense Refill   albuterol (PROVENTIL) (2.5 MG/3ML) 0.083% nebulizer solution Inhale 3 mLs (2.5 mg total) by nebulization every 6 (six)  hours as needed for wheezing or shortness of breath. 150 mL 1   albuterol (VENTOLIN HFA) 108 (90 Base) MCG/ACT inhaler Inhale 2 puffs into the lungs every 6 (six) hours as needed for wheezing or shortness of breath. 18 g 0   amphetamine-dextroamphetamine (ADDERALL) 20 MG tablet Take 1 tablet (20 mg total) by mouth 2 (two) times daily. 60 tablet 0   busPIRone (BUSPAR) 5 MG tablet Take 2 tablets (10 mg total) by mouth 2 (two) times daily. 360 tablet 1   FLUoxetine (PROZAC) 40 MG capsule Take 1 capsule (40 mg total) by mouth daily. 90 capsule 1   fluticasone-salmeterol (ADVAIR DISKUS) 250-50 MCG/ACT AEPB Inhale 1 puff into the lungs in the morning and at bedtime. 60 each 11   levonorgestrel (MIRENA) 20 MCG/DAY IUD 1 each by Intrauterine route once.     No facility-administered medications prior to visit.     Per HPI unless specifically indicated in ROS section below Review of Systems  Constitutional:  Positive for chills. Negative for fever.  Genitourinary:  Positive for dysuria and frequency. Negative for hematuria, vaginal bleeding and vaginal discharge.  Musculoskeletal:  Positive for back pain.   Objective:  Ht 4' 11.5" (1.511 m) Comment: per chart  LMP 10/07/2022 (Exact Date)   BMI 35.15 kg/m   Wt Readings from Last 3 Encounters:  10/09/22 177 lb (80.3 kg)  09/11/22 179 lb (81.2 kg)  09/07/22 179 lb (81.2 kg)       Physical exam: Gen: alert, NAD, not ill appearing Pulm: speaks in complete sentences without increased work of breathing Psych: normal mood, normal thought  content      Results for orders placed or performed in visit on 09/11/22  POCT Urinalysis Dipstick (Automated)  Result Value Ref Range   Color, UA yellow    Clarity, UA hazy    Glucose, UA Negative Negative   Bilirubin, UA negative    Ketones, UA negative    Spec Grav, UA 1.020 1.010 - 1.025   Blood, UA 3    pH, UA 6.0 5.0 - 8.0   Protein, UA Negative Negative   Urobilinogen, UA 0.2 0.2 or 1.0 E.U./dL    Nitrite, UA negative    Leukocytes, UA Negative Negative   Assessment & Plan:   Dysuria Assessment & Plan: UA in office electrolyte Keflex 500 mg twice daily for 7 days  Orders: -     POCT urinalysis dipstick -     Cephalexin; Take 1 capsule (500 mg total) by mouth 2 (two) times daily.  Dispense: 14 capsule; Refill: 0  Microscopic hematuria Assessment & Plan: Will like to treat for cystitis that she does have microscopic hematuria.  Pending urine culture.  Orders: -     Urine Culture  Abnormal urinalysis Assessment & Plan: Given microscopic hematuria and symptoms will send off for urine culture.  Patient does have a history of smoking in the past  Orders: -     Urine Culture     I discussed the assessment and treatment plan with the patient. The patient was provided an opportunity to ask questions and all were answered. The patient agreed with the plan and demonstrated an understanding of the instructions. The patient was advised to call back or seek an in-person evaluation if the symptoms worsen or if the condition fails to improve as anticipated.  Follow up plan: Return if symptoms worsen or fail to improve.  Audria Nine, NP

## 2022-11-08 NOTE — Assessment & Plan Note (Signed)
UA in office electrolyte Keflex 500 mg twice daily for 7 days

## 2022-11-08 NOTE — Patient Instructions (Signed)
Nice to see you today I have sent in antibiotics to the pharmacy Let me know if you do not improve I will be in touch with the urine culture results once I have them

## 2022-11-08 NOTE — Assessment & Plan Note (Signed)
Given microscopic hematuria and symptoms will send off for urine culture.  Patient does have a history of smoking in the past

## 2022-11-08 NOTE — Assessment & Plan Note (Signed)
Will like to treat for cystitis that she does have microscopic hematuria.  Pending urine culture.

## 2022-11-10 ENCOUNTER — Encounter: Payer: Self-pay | Admitting: Family Medicine

## 2022-11-15 DIAGNOSIS — Z419 Encounter for procedure for purposes other than remedying health state, unspecified: Secondary | ICD-10-CM | POA: Diagnosis not present

## 2022-11-19 ENCOUNTER — Encounter: Payer: Self-pay | Admitting: Family Medicine

## 2022-11-19 ENCOUNTER — Telehealth: Payer: Self-pay | Admitting: Family Medicine

## 2022-11-19 NOTE — Telephone Encounter (Signed)
Noted. Thanks.

## 2022-11-19 NOTE — Telephone Encounter (Signed)
I spoke with pt; pt said starting on 11/18/22 pt developed prod cough with white phlegm,lt earache, scratchy throat; pt said usually when has earache and s/t pt develops strep. Pt has H/A pain level of 5; pt said does feel uncomfortable rt rib area with breathing. Pt has hx of pneumonia also. Pt has no SOB, no wheezing, no fever. Pt tested + covid on 11/18/22. Pt requested in office appt and scheduled with Allayne Gitelman NP on 11/20/22 at 2:40. With UC & ED precautions and pt voiced understanding. Pt advised to drink plenty of fluids, rest and ear mask and self quarantine for 5 days unless symptoms continue. Sending note to Dr Para March as Lorain Childes to PCP, Allayne Gitelman NP who is out of office today and [place] pool.

## 2022-11-19 NOTE — Telephone Encounter (Addendum)
Please triage patient about covid symptoms.  If symptoms are and remain mild, then supportive care may be most appropriate.  Thanks.

## 2022-11-19 NOTE — Telephone Encounter (Signed)
Noted  

## 2022-11-20 ENCOUNTER — Encounter: Payer: Self-pay | Admitting: Primary Care

## 2022-11-20 ENCOUNTER — Ambulatory Visit (INDEPENDENT_AMBULATORY_CARE_PROVIDER_SITE_OTHER): Payer: BC Managed Care – PPO | Admitting: Primary Care

## 2022-11-20 ENCOUNTER — Other Ambulatory Visit: Payer: BC Managed Care – PPO

## 2022-11-20 VITALS — BP 110/62 | HR 82 | Temp 97.6°F | Ht 59.5 in | Wt 178.0 lb

## 2022-11-20 DIAGNOSIS — U071 COVID-19: Secondary | ICD-10-CM | POA: Diagnosis not present

## 2022-11-20 MED ORDER — HYDROCOD POLI-CHLORPHE POLI ER 10-8 MG/5ML PO SUER
5.0000 mL | Freq: Two times a day (BID) | ORAL | 0 refills | Status: DC | PRN
Start: 2022-11-20 — End: 2022-12-04

## 2022-11-20 NOTE — Patient Instructions (Signed)
You may take the cough medication every 12 hours as needed.  It may cause drowsiness.  Continue Tylenol or ibuprofen as needed.  Be sure to hydrate well with water.  It was a pleasure meeting you!

## 2022-11-20 NOTE — Assessment & Plan Note (Addendum)
Improving slightly which is reassuring. Exam today without evidence of acute asthma exacerbation or pneumonia.  Start Tussionex, 5 mL twice daily as needed.  Drowsiness precautions provided.  Discussed that she should continue to feel better over the next several days. Return precautions provided.  Work note provided

## 2022-11-20 NOTE — Progress Notes (Signed)
Subjective:    Patient ID: Crystal Hammond, female    DOB: 1980-09-06, 42 y.o.   MRN: [id number]  HPI  Crystal Hammond is a very pleasant 42 y.o. female patient of Dr. Para March with a history of migraines, OSA, asthmatic bronchitis, prediabetes, agoraphobia who presents today to discuss COVID-19 infection.  Symptom onset 6 days ago with a headache. She then developed fatigue, altered taste, altered smell, mild cough, shortness of breath, right mid back pain. She slept 22 hours yesterday.   Three days ago she took a home Covid-19 test which was positive. She was exposed to Covid-19 infection prior to symptom onset.  She's taken Ibuprofen, Tylenol, and using a humidifier. She's feeling better today than yesterday. She is compliant to her Advair inhaler daily, she's not had to use her albuterol inhaler.   She would like some treatment for her cough.  She has tried [employer] in the past which were ineffective.  She is taking cough medication with codeine before and did well.   Review of Systems  Constitutional:  Positive for fatigue.  HENT:  Positive for sore throat. Negative for congestion.   Respiratory:  Positive for shortness of breath. Negative for wheezing.   Cardiovascular:  Negative for chest pain.  Neurological:  Positive for headaches.         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)   Heart murmur    OUTGREW   History of febrile seizure    History of kidney stones    History of pneumonia    Migraines    OCD (obsessive compulsive disorder)    Oral herpes    Pneumonia    PONV (postoperative nausea and vomiting)    Postpartum depression    Pre-diabetes    Psoriasis    PTSD (post-traumatic stress disorder)    Recurrent UTI    Rosacea    Sleep apnea    Strep pharyngitis 06/29/2022     Social History   Socioeconomic History   Marital status: Married    Spouse name: Dustin   Number of children: 4   Years of education: Not on file   Highest education level: Associate degree: occupational, Scientist, product/process development, or vocational program  Occupational History   Occupation: Curator: [employer]  Tobacco Use   Smoking status: Former    Current packs/day: 0.00    Average packs/day: 0.3 packs/day for 3.0 years (0.9 ttl pk-yrs)    Types: Cigarettes    Start date: 2010    Quit date: 2013    Years since quitting: 11.6    Passive exposure: Past   Smokeless tobacco: Never  Vaping Use   Vaping status: Never Used  Substance and Sexual Activity   Alcohol use: Yes    Alcohol/week: 2.0 standard drinks of alcohol    Types: 2 Cans of beer per week   Drug use: Never   Sexual activity: Yes    Birth control/protection: None  Other Topics Concern   Not on file  Social History Narrative   Three sons and one daughter. Second oldest has autism spectrum disorder and two oldest ADHD.   Right handed   Caffeine- 1 cup per day    Social Determinants of Corporate investment banker  Strain: Low Risk  (10/08/2022)   Overall Financial Resource Strain (CARDIA)    Difficulty of Paying Living Expenses: Not very hard  Food Insecurity: No Food Insecurity (10/08/2022)   Hunger Vital Sign    Worried About Running Out of Food in the Last Year: Never true    Ran Out of Food in the Last Year: Never true  Transportation Needs: No Transportation Needs (10/08/2022)   PRAPARE - Administrator, Civil Service (Medical): No    Lack of Transportation (Non-Medical): No  Physical Activity: Sufficiently Active (10/08/2022)   Exercise Vital Sign    Days of Exercise per Week: 3 days    Minutes of Exercise per Session: 60 min  Stress: Stress Concern Present (10/08/2022)   [employer] of Occupational Health - Occupational Stress Questionnaire    Feeling of Stress : Very much  Social  Connections: Moderately Isolated (10/08/2022)   Social Connection and Isolation Panel [NHANES]    Frequency of Communication with Friends and Family: More than three times a week    Frequency of Social Gatherings with Friends and Family: Twice a week    Attends Religious Services: Never    Database administrator or Organizations: No    Attends Engineer, structural: Not on file    Marital Status: Married  Catering manager Violence: Not on file    Past Surgical History:  Procedure Laterality Date   ABDOMINAL SURGERY  2012   Endometrial Surgery - went through c-section scar   CARPAL TUNNEL RELEASE Right 08/07/2021   Procedure: RIGHT CARPAL TUNNEL RELEASE;  Surgeon: Marlyne Beards, MD;  Location: [facility];  Service: Orthopedics;  Laterality: Right;   CARPAL TUNNEL RELEASE Left 08/30/2021   Procedure: LEFT CARPAL TUNNEL RELEASE;  Surgeon: Marlyne Beards, MD;  Location: [facility];  Service: Orthopedics;  Laterality: Left;   CESAREAN SECTION  2019   CESAREAN SECTION  2007   CESAREAN SECTION  2014   CESAREAN SECTION  2016   ENDOMETRIAL BIOPSY  2011   GASTROSCHISIS CLOSURE     LEEP     x 2   VENTRAL HERNIA REPAIR N/A 12/11/2019   Procedure: HERNIA REPAIR VENTRAL ADULT;  Surgeon: Carolan Shiver, MD;  Location: [facility];  Service: General;  Laterality: N/A;   WISDOM TOOTH EXTRACTION     XI ROBOTIC ASSISTED VENTRAL HERNIA N/A 12/11/2019   Procedure: XI ROBOTIC ASSISTED VENTRAL HERNIA Lap vs open;  Surgeon: Carolan Shiver, MD;  Location: [facility];  Service: General;  Laterality: N/A;    Family History  Problem Relation Age of Onset   Depression Mother    Alcohol abuse Mother    Hernia Mother    Rashes / Skin problems Mother    Thyroid disease Mother    Anxiety disorder Mother    Depression Father    Alcohol abuse Father    Gout Father    Diabetes Father    Asthma Brother    Bipolar disorder Brother    Drug abuse Brother     ADD / ADHD Brother    Colon cancer Paternal Grandmother    Allergic rhinitis Son    Asthma Son    ADD / ADHD Son    Allergic rhinitis Son    ADD / ADHD Son    Breast cancer Maternal Aunt    Lung cancer Maternal Aunt    Ovarian cancer Neg Hx     Allergies  Allergen Reactions   Other Swelling  SHELLFISH    Phenazopyridine Hives and Rash   Benadryl [Diphenhydramine]    Prednisone     Intolerant- skin feels hot   Zomig [Zolmitriptan]     Diffuse aches.      Current Outpatient Medications on File Prior to Visit  Medication Sig Dispense Refill   albuterol (PROVENTIL) (2.5 MG/3ML) 0.083% nebulizer solution Inhale 3 mLs (2.5 mg total) by nebulization every 6 (six) hours as needed for wheezing or shortness of breath. 150 mL 1   albuterol (VENTOLIN HFA) 108 (90 Base) MCG/ACT inhaler Inhale 2 puffs into the lungs every 6 (six) hours as needed for wheezing or shortness of breath. 18 g 0   amphetamine-dextroamphetamine (ADDERALL) 20 MG tablet Take 1 tablet (20 mg total) by mouth 2 (two) times daily. 60 tablet 0   busPIRone (BUSPAR) 5 MG tablet Take 2 tablets (10 mg total) by mouth 2 (two) times daily. 360 tablet 1   FLUoxetine (PROZAC) 40 MG capsule Take 1 capsule (40 mg total) by mouth daily. 90 capsule 1   fluticasone-salmeterol (ADVAIR DISKUS) 250-50 MCG/ACT AEPB Inhale 1 puff into the lungs in the morning and at bedtime. 60 each 11   levonorgestrel (MIRENA) 20 MCG/DAY IUD 1 each by Intrauterine route once.     No current facility-administered medications on file prior to visit.    BP 110/62   Pulse 82   Temp 97.6 F (36.4 C) (Temporal)   Ht 4' 11.5" (1.511 m)   Wt 178 lb (80.7 kg)   LMP 10/07/2022 (Exact Date)   SpO2 100%   BMI 35.35 kg/m  Objective:   Physical Exam Constitutional:      General: She is not in acute distress.    Appearance: She is ill-appearing.  HENT:     Right Ear: Tympanic membrane and ear canal normal.     Left Ear: Tympanic membrane and ear canal  normal.     Nose:     Right Sinus: No maxillary sinus tenderness or frontal sinus tenderness.     Left Sinus: No maxillary sinus tenderness or frontal sinus tenderness.     Mouth/Throat:     Pharynx: No posterior oropharyngeal erythema.  Eyes:     Conjunctiva/sclera: Conjunctivae normal.  Cardiovascular:     Rate and Rhythm: Normal rate and regular rhythm.  Pulmonary:     Effort: Pulmonary effort is normal.     Breath sounds: Normal breath sounds. No wheezing or rales.  Musculoskeletal:     Cervical back: Neck supple.  Lymphadenopathy:     Cervical: No cervical adenopathy.  Skin:    General: Skin is warm and dry.           Assessment & Plan:  COVID-19 virus infection Assessment & Plan: Improving slightly which is reassuring. Exam today without evidence of acute asthma exacerbation or pneumonia.  Start Tussionex, 5 mL twice daily as needed.  Drowsiness precautions provided.  Discussed that she should continue to feel better over the next several days. Return precautions provided.  Work note provided  Orders: -     Hydrocod Poli-Chlorphe Poli ER; Take 5 mLs by mouth every 12 (twelve) hours as needed.  Dispense: 50 mL; Refill: 0        Doreene Nest, NP

## 2022-12-03 ENCOUNTER — Encounter: Payer: Self-pay | Admitting: Family Medicine

## 2022-12-04 ENCOUNTER — Encounter: Payer: Self-pay | Admitting: Family Medicine

## 2022-12-04 ENCOUNTER — Ambulatory Visit (INDEPENDENT_AMBULATORY_CARE_PROVIDER_SITE_OTHER): Payer: BC Managed Care – PPO | Admitting: Family Medicine

## 2022-12-04 VITALS — BP 112/76 | HR 85 | Temp 97.7°F | Ht 59.5 in | Wt 175.4 lb

## 2022-12-04 DIAGNOSIS — N3000 Acute cystitis without hematuria: Secondary | ICD-10-CM | POA: Diagnosis not present

## 2022-12-04 DIAGNOSIS — R300 Dysuria: Secondary | ICD-10-CM | POA: Diagnosis not present

## 2022-12-04 DIAGNOSIS — N898 Other specified noninflammatory disorders of vagina: Secondary | ICD-10-CM | POA: Diagnosis not present

## 2022-12-04 LAB — POC URINALSYSI DIPSTICK (AUTOMATED)
Bilirubin, UA: NEGATIVE
Blood, UA: 25 — AB
Glucose, UA: NEGATIVE
Ketones, UA: NEGATIVE
Nitrite, UA: NEGATIVE
Protein, UA: POSITIVE — AB
Spec Grav, UA: 1.01 (ref 1.010–1.025)
Urobilinogen, UA: 0.2 E.U./dL
pH, UA: 7 (ref 5.0–8.0)

## 2022-12-04 MED ORDER — FLUCONAZOLE 150 MG PO TABS: ORAL_TABLET | ORAL | 0 refills | Status: DC

## 2022-12-04 MED ORDER — SULFAMETHOXAZOLE-TRIMETHOPRIM 800-160 MG PO TABS: 1.0000 | ORAL_TABLET | Freq: Two times a day (BID) | ORAL | 0 refills | Status: DC

## 2022-12-04 NOTE — Assessment & Plan Note (Signed)
Suspect yeast vaginitis from recent uti   She does not tolerate/can not use over the counter or topical products for this  Sent in diflucan 150-one now and one at end of this course of antibiotic  Call back and Er precautions noted in detail today  Update if not starting to improve in a week or if worsening

## 2022-12-04 NOTE — Assessment & Plan Note (Addendum)
Pt is unsure if last uti got completely better Reviewed notes from NP Cable as well as prior urinalysis and culture  Was treated with keflex  Then got sick with covid as well / had family illness and did not get any rest   Today urinalysis is positive for leuk and blood  Sent for culture Treatment with bactrm ds 5 d (discussed need to use sun protection) Will update when culture returns  Encouraged more water intake  Discussed ways to prevent utis- increase water/void when needed and urinate pre and post intercourse, wipe front to back  Diflucan prescription for yeast symptoms following antibiotic as well   Update if not starting to improve in a week or if worsening  Call back and Er precautions noted in detail today   Handout given on uti

## 2022-12-04 NOTE — Progress Notes (Signed)
Subjective:    Patient ID: Crystal Hammond, female    DOB: 20-Jun-1980, 42 y.o.   MRN: [id number]  HPI  Wt Readings from Last 3 Encounters:  12/04/22 175 lb 6 oz (79.5 kg)  11/20/22 178 lb (80.7 kg)  10/09/22 177 lb (80.3 kg)   34.83 kg/m  Vitals:   12/04/22 1152  BP: 112/76  Pulse: 85  Temp: 97.7 F (36.5 C)  SpO2: 97%   42 yo pt of Dr Para March presents with urinary symptoms  She has a history of utis and renal stones as well as prediabetes and endometriosis Past micro hematuria  Has a mirena IUD     Had a visit for dysuria with NP Cable on 7/25 Treated with keflex  Had a positive culture for e coli- was pan sensitive   She is unsure if she totally got rid of it  Then she caught covid  Whole family was sick also  Husband in ER with kidney stones also   Frequency  Occational incontinence  Urgency  Yesterday some chills  Pain to urinate   Some itching also-  ? Yeast infection  Did not try anything over the counter - does not tolerate  No discharge    Did over the counter azo test  It was positive   Urinalysis today is positive  Results for orders placed or performed in visit on 12/04/22  POCT Urinalysis Dipstick (Automated)  Result Value Ref Range   Color, UA Yellow    Clarity, UA Cloudy    Glucose, UA Negative Negative   Bilirubin, UA Negative    Ketones, UA Negative    Spec Grav, UA 1.010 1.010 - 1.025   Blood, UA 25 Ery/uL (A)    pH, UA 7.0 5.0 - 8.0   Protein, UA Positive (A) Negative   Urobilinogen, UA 0.2 0.2 or 1.0 E.U./dL   Nitrite, UA Negative    Leukocytes, UA Large (3+) (A) Negative    Lab Results  Component Value Date   NA 138 06/07/2022   K 4.0 06/07/2022   CO2 28 06/07/2022   GLUCOSE 98 06/07/2022   BUN 11 06/07/2022   CREATININE 0.80 06/07/2022   CALCIUM 9.4 06/07/2022   GFR 92.43 06/07/2022   GFRNONAA >60 12/08/2020       Patient Active Problem List   Diagnosis Date Noted   Acute cystitis 12/04/2022   Vaginal  itching 12/04/2022   Dysuria 11/08/2022   Microscopic hematuria 11/08/2022   Abnormal urinalysis 11/08/2022   IUD (intrauterine device) in place 11/05/2022   Loss of height 10/10/2022   Back pain 09/12/2022   Endometriosis 08/24/2022   History of gestational diabetes 08/24/2022   FH: genetic disorder 06/10/2022   Asthmatic bronchitis 01/30/2022   Bilateral carpal tunnel syndrome 07/31/2021   Advance care planning 07/09/2021   OSA (obstructive sleep apnea) 07/09/2021   SOBOE (shortness of breath on exertion) 03/19/2021   COVID-19 virus infection 11/30/2020   Vitamin D deficiency 01/26/2020   Incisional hernia 12/11/2019   Prediabetes 07/26/2019   GAD (generalized anxiety disorder) 06/25/2019   Adult ADHD 06/25/2019   Depressive disorder 06/25/2019   Family history of congenital anomalies 05/27/2017   Herpes 05/27/2017   Hx of cesarean section 05/27/2017   Migraine with aura 05/27/2017   Agoraphobia 05/30/2016   History of abnormal cervical Pap smear 01/31/2016   History of kidney stones 01/31/2016   Past Medical History:  Diagnosis Date   Anxiety    Cervical dysplasia  Complication of anesthesia    LOW BLOOD PRESSURE   Depression    Endometriosis    FH: genetic disorder 06/10/2022   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)   Heart murmur    OUTGREW   History of febrile seizure    History of kidney stones    History of pneumonia    Migraines    OCD (obsessive compulsive disorder)    Oral herpes    Pneumonia    PONV (postoperative nausea and vomiting)    Postpartum depression    Pre-diabetes    Psoriasis    PTSD (post-traumatic stress disorder)    Recurrent UTI    Rosacea    Sleep apnea    Strep pharyngitis 06/29/2022   Past Surgical History:  Procedure Laterality Date   ABDOMINAL SURGERY  2012   Endometrial Surgery - went through c-section scar   CARPAL TUNNEL RELEASE Right 08/07/2021   Procedure: RIGHT CARPAL TUNNEL RELEASE;  Surgeon: Marlyne Beards, MD;   Location: [facility];  Service: Orthopedics;  Laterality: Right;   CARPAL TUNNEL RELEASE Left 08/30/2021   Procedure: LEFT CARPAL TUNNEL RELEASE;  Surgeon: Marlyne Beards, MD;  Location: [facility];  Service: Orthopedics;  Laterality: Left;   CESAREAN SECTION  2019   CESAREAN SECTION  2007   CESAREAN SECTION  2014   CESAREAN SECTION  2016   ENDOMETRIAL BIOPSY  2011   GASTROSCHISIS CLOSURE     LEEP     x 2   VENTRAL HERNIA REPAIR N/A 12/11/2019   Procedure: HERNIA REPAIR VENTRAL ADULT;  Surgeon: Carolan Shiver, MD;  Location: [facility];  Service: General;  Laterality: N/A;   WISDOM TOOTH EXTRACTION     XI ROBOTIC ASSISTED VENTRAL HERNIA N/A 12/11/2019   Procedure: XI ROBOTIC ASSISTED VENTRAL HERNIA Lap vs open;  Surgeon: Carolan Shiver, MD;  Location: [facility];  Service: General;  Laterality: N/A;   Social History   Tobacco Use   Smoking status: Former    Current packs/day: 0.00    Average packs/day: 0.3 packs/day for 3.0 years (0.9 ttl pk-yrs)    Types: Cigarettes    Start date: 2010    Quit date: 2013    Years since quitting: 11.6    Passive exposure: Past   Smokeless tobacco: Never  Vaping Use   Vaping status: Never Used  Substance Use Topics   Alcohol use: Yes    Alcohol/week: 2.0 standard drinks of alcohol    Types: 2 Cans of beer per week   Drug use: Never   Family History  Problem Relation Age of Onset   Depression Mother    Alcohol abuse Mother    Hernia Mother    Rashes / Skin problems Mother    Thyroid disease Mother    Anxiety disorder Mother    Depression Father    Alcohol abuse Father    Gout Father    Diabetes Father    Asthma Brother    Bipolar disorder Brother    Drug abuse Brother    ADD / ADHD Brother    Colon cancer Paternal Grandmother    Allergic rhinitis Son    Asthma Son    ADD / ADHD Son    Allergic rhinitis Son    ADD / ADHD Son    Breast cancer Maternal Aunt    Lung cancer Maternal Aunt     Ovarian cancer Neg Hx    Allergies  Allergen Reactions   Other Swelling    SHELLFISH  Phenazopyridine Hives and Rash   Benadryl [Diphenhydramine]    Prednisone     Intolerant- skin feels hot   Zomig [Zolmitriptan]     Diffuse aches.     Current Outpatient Medications on File Prior to Visit  Medication Sig Dispense Refill   albuterol (PROVENTIL) (2.5 MG/3ML) 0.083% nebulizer solution Inhale 3 mLs (2.5 mg total) by nebulization every 6 (six) hours as needed for wheezing or shortness of breath. 150 mL 1   albuterol (VENTOLIN HFA) 108 (90 Base) MCG/ACT inhaler Inhale 2 puffs into the lungs every 6 (six) hours as needed for wheezing or shortness of breath. 18 g 0   amphetamine-dextroamphetamine (ADDERALL) 20 MG tablet Take 1 tablet (20 mg total) by mouth 2 (two) times daily. 60 tablet 0   busPIRone (BUSPAR) 5 MG tablet Take 2 tablets (10 mg total) by mouth 2 (two) times daily. 360 tablet 1   FLUoxetine (PROZAC) 40 MG capsule Take 1 capsule (40 mg total) by mouth daily. 90 capsule 1   fluticasone-salmeterol (ADVAIR DISKUS) 250-50 MCG/ACT AEPB Inhale 1 puff into the lungs in the morning and at bedtime. 60 each 11   levonorgestrel (MIRENA) 20 MCG/DAY IUD 1 each by Intrauterine route once.     No current facility-administered medications on file prior to visit.    Review of Systems  Constitutional:  Positive for fatigue. Negative for activity change, appetite change and fever.  HENT:  Negative for congestion and sore throat.   Eyes:  Negative for itching and visual disturbance.  Respiratory:  Negative for cough and shortness of breath.   Cardiovascular:  Positive for palpitations. Negative for leg swelling.  Gastrointestinal:  Negative for abdominal distention, abdominal pain, constipation, diarrhea and nausea.  Endocrine: Negative for cold intolerance and polydipsia.  Genitourinary:  Positive for dysuria, frequency, menstrual problem and urgency. Negative for difficulty urinating,  flank pain, hematuria and vaginal discharge.       Vaginal itching   Recent problems with her IUD shifting /discomfort  Menstrual irreg   Musculoskeletal:  Negative for myalgias.  Skin:  Negative for rash.  Allergic/Immunologic: Negative for immunocompromised state.  Neurological:  Negative for dizziness and weakness.  Hematological:  Negative for adenopathy.  Psychiatric/Behavioral:         Frustrated with recent health and family issues        Objective:   Physical Exam Constitutional:      General: She is not in acute distress.    Appearance: Normal appearance. She is well-developed. She is obese. She is not ill-appearing or diaphoretic.  HENT:     Head: Normocephalic and atraumatic.  Eyes:     Conjunctiva/sclera: Conjunctivae normal.     Pupils: Pupils are equal, round, and reactive to light.  Cardiovascular:     Rate and Rhythm: Normal rate and regular rhythm.     Heart sounds: Normal heart sounds.  Pulmonary:     Effort: Pulmonary effort is normal.     Breath sounds: Normal breath sounds.  Abdominal:     General: Bowel sounds are normal. There is no distension.     Palpations: Abdomen is soft.     Tenderness: There is abdominal tenderness. There is no rebound.     Comments: No cva tenderness  Mild suprapubic tenderness  Irritated midline low abd scar noted (per pt from past hernia surgery)  Musculoskeletal:     Cervical back: Normal range of motion and neck supple.  Lymphadenopathy:     Cervical: No cervical adenopathy.  Skin:    Findings: No rash.  Neurological:     Mental Status: She is alert.  Psychiatric:     Comments: Pt voices frustration over medical issues today           Assessment & Plan:   Problem List Items Addressed This Visit       Genitourinary   Vaginal itching    Suspect yeast vaginitis from recent uti   She does not tolerate/can not use over the counter or topical products for this  Sent in diflucan 150-one now and one at end  of this course of antibiotic  Call back and Er precautions noted in detail today  Update if not starting to improve in a week or if worsening        Acute cystitis - Primary    Pt is unsure if last uti got completely better Reviewed notes from NP Cable as well as prior urinalysis and culture  Was treated with keflex  Then got sick with covid as well / had family illness and did not get any rest   Today urinalysis is positive for leuk and blood  Sent for culture Treatment with bactrm ds 5 d (discussed need to use sun protection) Will update when culture returns  Encouraged more water intake  Discussed ways to prevent utis- increase water/void when needed and urinate pre and post intercourse, wipe front to back  Diflucan prescription for yeast symptoms following antibiotic as well   Update if not starting to improve in a week or if worsening  Call back and Er precautions noted in detail today   Handout given on uti       Relevant Orders   Urine Culture     Other   Dysuria   Relevant Orders   POCT Urinalysis Dipstick (Automated) (Completed)

## 2022-12-04 NOTE — Patient Instructions (Addendum)
Drink lots of water  Take the generic bactrim as directed  Take diflucan now and another one at end of the antibiotics    We will contact you when the culture comes back

## 2022-12-06 ENCOUNTER — Encounter: Payer: Self-pay | Admitting: Family Medicine

## 2022-12-06 LAB — URINE CULTURE
MICRO NUMBER:: 15355773
SPECIMEN QUALITY:: ADEQUATE

## 2022-12-09 NOTE — Telephone Encounter (Signed)
Appreciate input from Dr. Milinda Antis.

## 2022-12-13 NOTE — Telephone Encounter (Signed)
Please schedule follow up with her pcp asap Thanks

## 2022-12-14 ENCOUNTER — Encounter: Payer: Self-pay | Admitting: Family Medicine

## 2022-12-14 ENCOUNTER — Ambulatory Visit: Payer: BC Managed Care – PPO | Admitting: Family Medicine

## 2022-12-14 ENCOUNTER — Ambulatory Visit
Admission: RE | Admit: 2022-12-14 | Discharge: 2022-12-14 | Disposition: A | Discharge status: Home / Self Care | Payer: BC Managed Care – PPO | Source: Ambulatory Visit | Attending: Family Medicine | Admitting: Family Medicine

## 2022-12-14 ENCOUNTER — Ambulatory Visit (INDEPENDENT_AMBULATORY_CARE_PROVIDER_SITE_OTHER): Payer: BC Managed Care – PPO | Admitting: Family Medicine

## 2022-12-14 VITALS — BP 126/82 | HR 95 | Temp 97.6°F | Ht 59.5 in | Wt 176.4 lb

## 2022-12-14 DIAGNOSIS — N200 Calculus of kidney: Secondary | ICD-10-CM

## 2022-12-14 DIAGNOSIS — R109 Unspecified abdominal pain: Secondary | ICD-10-CM | POA: Diagnosis not present

## 2022-12-14 DIAGNOSIS — R350 Frequency of micturition: Secondary | ICD-10-CM

## 2022-12-14 LAB — POC URINALSYSI DIPSTICK (AUTOMATED)
Bilirubin, UA: NEGATIVE
Blood, UA: 50 — AB
Glucose, UA: NEGATIVE
Ketones, UA: NEGATIVE
Leukocytes, UA: NEGATIVE
Nitrite, UA: NEGATIVE
Protein, UA: NEGATIVE
Urobilinogen, UA: 0.2 E.U./dL
pH, UA: 6 (ref 5.0–8.0)

## 2022-12-14 MED ORDER — HYDROCODONE-ACETAMINOPHEN 5-325 MG PO TABS: 1.0000 | ORAL_TABLET | Freq: Four times a day (QID) | ORAL | 0 refills | Status: DC | PRN

## 2022-12-14 NOTE — Assessment & Plan Note (Signed)
Recent uti  Today mod to severe left flank pain  Rbc in urinalysis (no sign of inf) Given urine screen Has passed multiple renal stones in past  Urology ref done CT ordered  Encouraged to drink water Norco prescription for pain to use with caution of sedation and habit and constipation

## 2022-12-14 NOTE — Assessment & Plan Note (Addendum)
Suspect she passed one recently  Has left flank pain now  Reviewed old records/imaging and labs  Needs CT -ordered for today  Norco for pain  Declines flonax-did not work in past  Urine screen given  Ref to urology for frequent stones

## 2022-12-14 NOTE — Progress Notes (Signed)
Subjective:    Patient ID: Crystal Hammond, female    DOB: [birth year], 42 y.o.   MRN: [id number]  HPI  Wt Readings from Last 3 Encounters:  12/14/22 176 lb 6 oz (80 kg)  12/04/22 175 lb 6 oz (79.5 kg)  11/20/22 178 lb (80.7 kg)   35.03 kg/m  Vitals:   12/14/22 1156  BP: 126/82  Pulse: 95  Temp: 97.6 F (36.4 C)  SpO2: 97%   42 yo pt of Dr Para March presents with urinary problems    Pt has been dealing with urinary symptoms  Was treatment with keflex and then bactrim ds for positive urinalysis / uti  Last visit also treatment for yeast infection   Last urine culture showed pan sensitive e coli  Was treatment with bactrim for that   Pt thinks in the meantime she passed a kidney stone (8/22) - on the right   Symptoms improved Then mentioned a pinching feeling when urinating  Also some urine odor (but has looked clear)   Is drinking lots of water  Was also playing tennis in the heat   Yesterday left side of back started hurting a lot   Does not have a urologist currently   Took tylenol today  Burgess Estelle took an old hydrocodone   Today urinalysis notes rbc -otherwise clear   Results for orders placed or performed in visit on 12/14/22  POCT Urinalysis Dipstick (Automated)  Result Value Ref Range   Color, UA Yellow    Clarity, UA Clear    Glucose, UA Negative Negative   Bilirubin, UA Negative    Ketones, UA Negative    Blood, UA 50 Ery/uL (A)    pH, UA 6.0 5.0 - 8.0   Protein, UA Negative Negative   Urobilinogen, UA 0.2 0.2 or 1.0 E.U./dL   Nitrite, UA Negative    Leukocytes, UA Negative Negative     Has had kidney stones in the past  Noted on abd xray 08/2022 (7mm over interpolar region of left kidney)  CT renal stone study 2021 noted non ost bilat reanl calculi   Has passed numerous stones in the past  Especially as a teen    No past stent or lithotripsy  Passes on her own    Patient Active Problem List   Diagnosis Date Noted   Kidney stone  12/14/2022   Acute cystitis 12/04/2022   Vaginal itching 12/04/2022   Dysuria 11/08/2022   Microscopic hematuria 11/08/2022   Abnormal urinalysis 11/08/2022   IUD (intrauterine device) in place 11/05/2022   Loss of height 10/10/2022   Back pain 09/12/2022   Endometriosis 08/24/2022   History of gestational diabetes 08/24/2022   FH: genetic disorder 06/10/2022   Asthmatic bronchitis 01/30/2022   Bilateral carpal tunnel syndrome 07/31/2021   Advance care planning 07/09/2021   OSA (obstructive sleep apnea) 07/09/2021   SOBOE (shortness of breath on exertion) 03/19/2021   Acute left flank pain 02/21/2021   COVID-19 virus infection 11/30/2020   Vitamin D deficiency 01/26/2020   Incisional hernia 12/11/2019   Prediabetes 07/26/2019   GAD (generalized anxiety disorder) 06/25/2019   Adult ADHD 06/25/2019   Depressive disorder 06/25/2019   Family history of congenital anomalies 05/27/2017   Herpes 05/27/2017   Hx of cesarean section 05/27/2017   Migraine with aura 05/27/2017   Agoraphobia 05/30/2016   History of abnormal cervical Pap smear 01/31/2016   History of kidney stones 01/31/2016   Past Medical History:  Diagnosis Date   Anxiety  Cervical dysplasia    Complication of anesthesia    LOW BLOOD PRESSURE   Depression    Endometriosis    FH: genetic disorder 06/10/2022   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)   Heart murmur    OUTGREW   History of febrile seizure    History of kidney stones    History of pneumonia    Migraines    OCD (obsessive compulsive disorder)    Oral herpes    Pneumonia    PONV (postoperative nausea and vomiting)    Postpartum depression    Pre-diabetes    Psoriasis    PTSD (post-traumatic stress disorder)    Recurrent UTI    Rosacea    Sleep apnea    Strep pharyngitis 06/29/2022   Past Surgical History:  Procedure Laterality Date   ABDOMINAL SURGERY  2012   Endometrial Surgery - went through c-section scar   CARPAL TUNNEL RELEASE Right  08/07/2021   Procedure: RIGHT CARPAL TUNNEL RELEASE;  Surgeon: Marlyne Beards, MD;  Location: [facility];  Service: Orthopedics;  Laterality: Right;   CARPAL TUNNEL RELEASE Left 08/30/2021   Procedure: LEFT CARPAL TUNNEL RELEASE;  Surgeon: Marlyne Beards, MD;  Location: [facility];  Service: Orthopedics;  Laterality: Left;   CESAREAN SECTION  2019   CESAREAN SECTION  2007   CESAREAN SECTION  2014   CESAREAN SECTION  2016   ENDOMETRIAL BIOPSY  2011   GASTROSCHISIS CLOSURE     LEEP     x 2   VENTRAL HERNIA REPAIR N/A 12/11/2019   Procedure: HERNIA REPAIR VENTRAL ADULT;  Surgeon: Carolan Shiver, MD;  Location: [facility];  Service: General;  Laterality: N/A;   WISDOM TOOTH EXTRACTION     XI ROBOTIC ASSISTED VENTRAL HERNIA N/A 12/11/2019   Procedure: XI ROBOTIC ASSISTED VENTRAL HERNIA Lap vs open;  Surgeon: Carolan Shiver, MD;  Location: [facility];  Service: General;  Laterality: N/A;   Social History   Tobacco Use   Smoking status: Former    Current packs/day: 0.00    Average packs/day: 0.3 packs/day for 3.0 years (0.9 ttl pk-yrs)    Types: Cigarettes    Start date: 2010    Quit date: 2013    Years since quitting: 11.6    Passive exposure: Past   Smokeless tobacco: Never  Vaping Use   Vaping status: Never Used  Substance Use Topics   Alcohol use: Yes    Alcohol/week: 2.0 standard drinks of alcohol    Types: 2 Cans of beer per week   Drug use: Never   Family History  Problem Relation Age of Onset   Depression Mother    Alcohol abuse Mother    Hernia Mother    Rashes / Skin problems Mother    Thyroid disease Mother    Anxiety disorder Mother    Depression Father    Alcohol abuse Father    Gout Father    Diabetes Father    Asthma Brother    Bipolar disorder Brother    Drug abuse Brother    ADD / ADHD Brother    Colon cancer Paternal Grandmother    Allergic rhinitis Son    Asthma Son    ADD / ADHD Son    Allergic rhinitis  Son    ADD / ADHD Son    Breast cancer Maternal Aunt    Lung cancer Maternal Aunt    Ovarian cancer Neg Hx    Allergies  Allergen Reactions   Other  Swelling    SHELLFISH    Phenazopyridine Hives and Rash   Benadryl [Diphenhydramine]    Prednisone     Intolerant- skin feels hot   Zomig [Zolmitriptan]     Diffuse aches.     Current Outpatient Medications on File Prior to Visit  Medication Sig Dispense Refill   albuterol (PROVENTIL) (2.5 MG/3ML) 0.083% nebulizer solution Inhale 3 mLs (2.5 mg total) by nebulization every 6 (six) hours as needed for wheezing or shortness of breath. 150 mL 1   albuterol (VENTOLIN HFA) 108 (90 Base) MCG/ACT inhaler Inhale 2 puffs into the lungs every 6 (six) hours as needed for wheezing or shortness of breath. 18 g 0   amphetamine-dextroamphetamine (ADDERALL) 20 MG tablet Take 1 tablet (20 mg total) by mouth 2 (two) times daily. 60 tablet 0   busPIRone (BUSPAR) 5 MG tablet Take 2 tablets (10 mg total) by mouth 2 (two) times daily. 360 tablet 1   FLUoxetine (PROZAC) 40 MG capsule Take 1 capsule (40 mg total) by mouth daily. 90 capsule 1   fluticasone-salmeterol (ADVAIR DISKUS) 250-50 MCG/ACT AEPB Inhale 1 puff into the lungs in the morning and at bedtime. 60 each 11   levonorgestrel (MIRENA) 20 MCG/DAY IUD 1 each by Intrauterine route once.     No current facility-administered medications on file prior to visit.    Review of Systems  Constitutional:  Negative for activity change, appetite change, fatigue, fever and unexpected weight change.  HENT:  Negative for congestion, ear pain, rhinorrhea, sinus pressure and sore throat.   Eyes:  Negative for pain, redness and visual disturbance.  Respiratory:  Negative for cough, shortness of breath and wheezing.   Cardiovascular:  Negative for chest pain and palpitations.  Gastrointestinal:  Negative for abdominal pain, blood in stool, constipation and diarrhea.  Endocrine: Negative for polydipsia and polyuria.   Genitourinary:  Positive for dysuria and flank pain. Negative for frequency, urgency and vaginal discharge.       Flank pain  Musculoskeletal:  Negative for arthralgias, back pain and myalgias.  Skin:  Negative for pallor and rash.  Allergic/Immunologic: Negative for environmental allergies.  Neurological:  Negative for dizziness, syncope and headaches.  Hematological:  Negative for adenopathy. Does not bruise/bleed easily.  Psychiatric/Behavioral:  Negative for decreased concentration and dysphoric mood. The patient is not nervous/anxious.        Objective:   Physical Exam Constitutional:      General: She is not in acute distress.    Appearance: Normal appearance. She is well-developed. She is obese. She is not ill-appearing or diaphoretic.  HENT:     Head: Normocephalic and atraumatic.     Mouth/Throat:     Mouth: Mucous membranes are moist.  Eyes:     Conjunctiva/sclera: Conjunctivae normal.     Pupils: Pupils are equal, round, and reactive to light.  Neck:     Thyroid: No thyromegaly.     Vascular: No carotid bruit or JVD.  Cardiovascular:     Rate and Rhythm: Normal rate and regular rhythm.     Heart sounds: Normal heart sounds.     No gallop.  Pulmonary:     Effort: Pulmonary effort is normal. No respiratory distress.     Breath sounds: Normal breath sounds. No wheezing or rales.  Abdominal:     General: There is no distension or abdominal bruit.     Palpations: Abdomen is soft. There is no mass.     Tenderness: There is abdominal tenderness. There  is left CVA tenderness.     Hernia: No hernia is present.     Comments: Mild suprapubic tenderness No rebound    Musculoskeletal:     Cervical back: Normal range of motion and neck supple.     Right lower leg: No edema.     Left lower leg: No edema.  Lymphadenopathy:     Cervical: No cervical adenopathy.  Skin:    General: Skin is warm and dry.     Coloration: Skin is not pale.     Findings: No rash.   Neurological:     Mental Status: She is alert.     Coordination: Coordination normal.     Deep Tendon Reflexes: Reflexes are normal and symmetric. Reflexes normal.  Psychiatric:        Mood and Affect: Mood normal.           Assessment & Plan:   Problem List Items Addressed This Visit       Genitourinary   Kidney stone - Primary   Relevant Medications   HYDROcodone-acetaminophen (NORCO) 5-325 MG tablet   Other Relevant Orders   CT RENAL STONE STUDY   Ambulatory referral to Urology     Other   Acute left flank pain    Recent uti  Today mod to severe left flank pain  Rbc in urinalysis (no sign of inf) Given urine screen Has passed multiple renal stones in past  Urology ref done CT ordered  Encouraged to drink water Norco prescription for pain to use with caution of sedation and habit and constipation       Relevant Orders   CT RENAL STONE STUDY   Ambulatory referral to Urology   Other Visit Diagnoses     Urinary frequency       Relevant Orders   POCT Urinalysis Dipstick (Automated) (Completed)

## 2022-12-14 NOTE — Patient Instructions (Signed)
Drink lots of water   I put the referral in  Please let us know if you don't hear in 1-2 weeks   I also ordered a CT scan    Take norco if needed for severe kidney stone pain  Take miralax if it constipates  Use the urine screen to catch stone or stone pieces    If symptoms get severe-go to your ER

## 2022-12-16 DIAGNOSIS — Z419 Encounter for procedure for purposes other than remedying health state, unspecified: Secondary | ICD-10-CM | POA: Diagnosis not present

## 2022-12-17 ENCOUNTER — Other Ambulatory Visit: Payer: Self-pay | Admitting: Family Medicine

## 2022-12-17 ENCOUNTER — Telehealth: Payer: Self-pay | Admitting: Family Medicine

## 2022-12-17 MED ORDER — AMPHETAMINE-DEXTROAMPHETAMINE 20 MG PO TABS: 20.0000 mg | ORAL_TABLET | Freq: Two times a day (BID) | ORAL | 0 refills | Status: DC

## 2022-12-17 MED ORDER — TRAMADOL HCL 50 MG PO TABS: 50.0000 mg | ORAL_TABLET | Freq: Two times a day (BID) | ORAL | 0 refills | Status: AC | PRN

## 2022-12-17 NOTE — Telephone Encounter (Signed)
Agree, please set follow-up when possible.  Thanks.

## 2022-12-17 NOTE — Progress Notes (Signed)
Received note the patient needed refill on Adderall.  Prescription sent.

## 2022-12-17 NOTE — Telephone Encounter (Signed)
-----   Message from [facility] sent at 12/14/2022  4:52 PM EDT ----- Pt notified of CT results results and Dr. Royden Purl, pt said [facility] doesn't have pain med in stock and request alt med sent in

## 2022-12-17 NOTE — Telephone Encounter (Signed)
Sent small amount ot tramadol (now know she does not have a passing stone)   If pain worsens please alert us/pcp

## 2022-12-21 ENCOUNTER — Other Ambulatory Visit: Payer: Self-pay

## 2022-12-21 ENCOUNTER — Ambulatory Visit (INDEPENDENT_AMBULATORY_CARE_PROVIDER_SITE_OTHER): Payer: BC Managed Care – PPO | Admitting: Urology

## 2022-12-21 ENCOUNTER — Other Ambulatory Visit
Admission: RE | Admit: 2022-12-21 | Discharge: 2022-12-21 | Disposition: A | Discharge status: Home / Self Care | Payer: BC Managed Care – PPO | Attending: Urology | Admitting: Urology

## 2022-12-21 ENCOUNTER — Encounter: Payer: Self-pay | Admitting: Urology

## 2022-12-21 VITALS — BP 112/73 | HR 94 | Ht 59.5 in | Wt 176.0 lb

## 2022-12-21 DIAGNOSIS — N200 Calculus of kidney: Secondary | ICD-10-CM | POA: Insufficient documentation

## 2022-12-21 DIAGNOSIS — Z87440 Personal history of urinary (tract) infections: Secondary | ICD-10-CM

## 2022-12-21 LAB — URINALYSIS, COMPLETE (UACMP) WITH MICROSCOPIC
Bilirubin Urine: NEGATIVE
Glucose, UA: NEGATIVE mg/dL
Ketones, ur: NEGATIVE mg/dL
Leukocytes,Ua: NEGATIVE
Nitrite: NEGATIVE
Protein, ur: NEGATIVE mg/dL
Specific Gravity, Urine: 1.025 (ref 1.005–1.030)
pH: 6 (ref 5.0–8.0)

## 2022-12-21 NOTE — Progress Notes (Signed)
Marcelle Overlie Plume,acting as a scribe for Vanna Scotland, MD.,have documented all relevant documentation on the behalf of Vanna Scotland, MD,as directed by  Vanna Scotland, MD while in the presence of Vanna Scotland, MD.  12/21/2022 4:43 PM   [name] 1981-03-05 [id number]  Referring provider: Judy Pimple, MD [address] [place],  [place] [address]  Chief Complaint  Patient presents with   Nephrolithiasis    HPI: 42 year-old female who presents today for further evaluation of a kidney stone event.   She has been seen and evaluated by her primary care with concern for possible urinary tract infections and yeast infections. She has had multiple urinalysis over the past month that show blood, although associated urine cultures have also grown E.coli. She has been treated with several rounds of antibiotics.   Her urinalysis today shows persistent blood.   She also felt like she was having a kidney stone event, complaining of bilateral flank pain. She does have a left UPJ stone measuring 7 mm within the renal pelvis. There is no overt hydronephrosis, although the axis of the kidney is slightly abnormal and does have a fuller renal pelvis. Possibly low-grade UPJ obstruction when compared to previous study. She also has extensive cortical scarring on the contralateral right side. The stone to skin distance is 10.5 and it is around 1000 Hounsfield units.   Today, she reports continued urinary urgency. She reports that her other urinary symptoms have resolved since beginning on antibiotics.    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)   Heart murmur    OUTGREW   History of febrile seizure    History of kidney stones    History of pneumonia    Kidney stone    Migraines    OCD (obsessive  compulsive disorder)    Oral herpes    Pneumonia    PONV (postoperative nausea and vomiting)    Postpartum depression    Pre-diabetes    Psoriasis    PTSD (post-traumatic stress disorder)    Recurrent UTI    Rosacea    Sleep apnea    Strep pharyngitis 06/29/2022    Surgical History: Past Surgical History:  Procedure Laterality Date   ABDOMINAL SURGERY  2012   Endometrial Surgery - went through c-section scar   CARPAL TUNNEL RELEASE Right 08/07/2021   Procedure: RIGHT CARPAL TUNNEL RELEASE;  Surgeon: Marlyne Beards, MD;  Location: [facility];  Service: Orthopedics;  Laterality: Right;   CARPAL TUNNEL RELEASE Left 08/30/2021   Procedure: LEFT CARPAL TUNNEL RELEASE;  Surgeon: Marlyne Beards, MD;  Location: [facility];  Service: Orthopedics;  Laterality: Left;   CESAREAN SECTION  2019   CESAREAN SECTION  2007   CESAREAN SECTION  2014   CESAREAN SECTION  2016   ENDOMETRIAL BIOPSY  2011   GASTROSCHISIS CLOSURE     LEEP     x 2   VENTRAL HERNIA REPAIR N/A 12/11/2019   Procedure: HERNIA REPAIR VENTRAL ADULT;  Surgeon: Carolan Shiver, MD;  Location: [facility];  Service: General;  Laterality: N/A;   WISDOM TOOTH EXTRACTION     XI ROBOTIC ASSISTED VENTRAL HERNIA N/A 12/11/2019   Procedure: XI ROBOTIC ASSISTED VENTRAL HERNIA Lap vs open;  Surgeon: Carolan Shiver, MD;  Location: [facility];  Service: General;  Laterality: N/A;    Home Medications:  Allergies as of 12/21/2022       Reactions   Other Swelling   SHELLFISH   Phenazopyridine Hives, Rash   Benadryl [diphenhydramine]    Prednisone    Intolerant- skin feels hot   Zomig [zolmitriptan]    Diffuse aches.          Medication List        Accurate as of December 21, 2022  4:43 PM. If you have any questions, ask your nurse or doctor.          Advair Diskus 250-50 MCG/ACT Aepb Generic drug: fluticasone-salmeterol Inhale 1 puff into the lungs in the morning and at  bedtime.   albuterol 108 (90 Base) MCG/ACT inhaler Commonly known as: VENTOLIN HFA Inhale 2 puffs into the lungs every 6 (six) hours as needed for wheezing or shortness of breath.   albuterol (2.5 MG/3ML) 0.083% nebulizer solution Commonly known as: PROVENTIL Inhale 3 mLs (2.5 mg total) by nebulization every 6 (six) hours as needed for wheezing or shortness of breath.   amphetamine-dextroamphetamine 20 MG tablet Commonly known as: Adderall Take 1 tablet (20 mg total) by mouth 2 (two) times daily.   busPIRone 5 MG tablet Commonly known as: BUSPAR Take 2 tablets (10 mg total) by mouth 2 (two) times daily.   FLUoxetine 40 MG capsule Commonly known as: PROZAC Take 1 capsule (40 mg total) by mouth daily.   levonorgestrel 20 MCG/DAY Iud Commonly known as: MIRENA 1 each by Intrauterine route once.   traMADol 50 MG tablet Commonly known as: ULTRAM Take 1 tablet (50 mg total) by mouth every 12 (twelve) hours as needed for up to 5 days for severe pain.        Allergies:  Allergies  Allergen Reactions   Other Swelling    SHELLFISH    Phenazopyridine Hives and Rash   Benadryl [Diphenhydramine]    Prednisone     Intolerant- skin feels hot   Zomig [Zolmitriptan]     Diffuse aches.      Family History: Family History  Problem Relation Age of Onset   Depression Mother    Alcohol abuse Mother    Hernia Mother    Rashes / Skin problems Mother    Thyroid disease Mother    Anxiety disorder Mother    Depression Father    Alcohol abuse Father    Gout Father    Diabetes Father    Asthma Brother    Bipolar disorder Brother    Drug abuse Brother    ADD / ADHD Brother    Colon cancer Paternal Grandmother    Allergic rhinitis Son    Asthma Son    ADD / ADHD Son    Allergic rhinitis Son    ADD / ADHD Son    Breast cancer Maternal Aunt    Lung cancer Maternal Aunt    Ovarian cancer Neg Hx     Social History:  reports that she quit smoking about 11 years ago. Her smoking  use included cigarettes. She started smoking about 14 years ago. She has a 0.9 pack-year smoking history. She has been exposed to tobacco smoke. She has never used smokeless tobacco. She reports current alcohol use of about 2.0 standard drinks of alcohol per week. She reports that she does not use drugs.   Physical Exam: BP 112/73 (BP Location: Left Arm, Patient Position: Sitting,  Cuff Size: Large)   Pulse 94   Ht 4' 11.5" (1.511 m)   Wt 176 lb (79.8 kg)   LMP 11/16/2022 (Exact Date)   BMI 34.95 kg/m   Constitutional:  Alert and oriented, No acute distress. HEENT: [place] AT, moist mucus membranes.  Trachea midline, no masses. Abdomen: Mild tenderness on palpation of the left flank. Neurologic: Grossly intact, no focal deficits, moving all 4 extremities. Psychiatric: Normal mood and affect.   Pertinent Imaging:     EXAM: CT ABDOMEN AND PELVIS WITHOUT CONTRAST  TECHNIQUE: Multidetector CT imaging of the abdomen and pelvis was performed following the standard protocol without IV contrast.  RADIATION DOSE REDUCTION: This exam was performed according to the departmental dose-optimization program which includes automated exposure control, adjustment of the mA and/or kV according to patient size and/or use of iterative reconstruction technique.  COMPARISON:  December 18, 2019.  November 02, 2022.  FINDINGS: Lower chest: No acute abnormality.  Hepatobiliary: No focal liver abnormality is seen. No gallstones, gallbladder wall thickening, or biliary dilatation.  Pancreas: Unremarkable. No pancreatic ductal dilatation or surrounding inflammatory changes.  Spleen: Normal in size without focal abnormality.  Adrenals/Urinary Tract: Adrenal glands appear normal. Bilateral nephrolithiasis is noted with the largest calculus measuring 7 mm in right renal pelvis. No hydronephrosis or renal obstruction is noted. Urinary bladder is unremarkable. Right renal cortical scarring  is noted.  Stomach/Bowel: Stomach is within normal limits. Appendix appears normal. No evidence of bowel wall thickening, distention, or inflammatory changes.  Vascular/Lymphatic: No significant vascular findings are present. No enlarged abdominal or pelvic lymph nodes.  Reproductive: Intrauterine device is noted in lower uterine segment or cervix as noted on recent pelvic ultrasound. No adnexal abnormality is noted.  Other: No ascites or hernia.  Musculoskeletal: No acute or significant osseous findings.  IMPRESSION: Bilateral nonobstructive nephrolithiasis. No hydronephrosis or renal obstruction is noted.  Extensive right renal cortical scarring is noted.  Intrauterine device is noted in lower uterine segment or cervix as noted on recent pelvic ultrasound.   Electronically Signed By: Lupita Raider M.D. On: 12/14/2022 15:26  This was personally reviewed and I agree with the radiologic interpretation.   Assessment & Plan:    1. Left renal pelvic stone - 7 mm stone seen on recent imaging - Likely causing intermittent obstruction pain (ball valve) - Unclear whether or note this is related to her infections - Would recommend treating this stone based on the size, location, and suspected chronicity - We discussed various treatment options for urolithiasis including observation with or without medical expulsive therapy, shockwave lithotripsy (SWL), ureteroscopy and laser lithotripsy with stent placement, and percutaneous nephrolithotomy.   We discussed that management is based on stone size, location, density, patient co-morbidities, and patient preference.    Stones <7mm in size have a >80% spontaneous passage rate. Data surrounding the use of tamsulosin for medical expulsive therapy is controversial, but meta analyses suggests it is most efficacious for distal stones between 5-93mm in size. Possible side effects include dizziness/lightheadedness, and retrograde  ejaculation.   SWL has a lower stone free rate in a single procedure, but also a lower complication rate compared to ureteroscopy and avoids a stent and associated stent related symptoms. Possible complications include renal hematoma, steinstrasse, and need for additional treatment. We discussed the role of his increased skin to stone distance can lead to decreased efficacy with shockwave lithotripsy.   Ureteroscopy with laser lithotripsy and stent placement has a higher stone free rate than SWL  in a single procedure, however increased complication rate including possible infection, ureteral injury, bleeding, and stent related morbidity. Common stent related symptoms include dysuria, urgency/frequency, and flank pain.   After an extensive discussion of the risks and benefits of the above treatment options, the patient would like to proceed with SWL.  2. Recurrent UTI - Urine culture to be repeated for pre-op clearance - Continue monitoring for symptoms of UTI.   Return for SWL.  I have reviewed the above documentation for accuracy and completeness, and I agree with the above.   Vanna Scotland, MD   [facility] [address], [address] [place], [place] [address] [phone]

## 2022-12-21 NOTE — H&P (View-Only) (Signed)
Crystal Hammond,acting as a scribe for Vanna Scotland, MD.,have documented all relevant documentation on the behalf of Vanna Scotland, MD,as directed by  Vanna Scotland, MD while in the presence of Vanna Scotland, MD.  12/21/2022 4:43 PM   Crystal Hammond [birth year] [id number]  Referring provider: Judy Pimple, MD [address] [place],  [place] [address]  Chief Complaint  Patient presents with   Nephrolithiasis    HPI: 42 year-old female who presents today for further evaluation of a kidney stone event.   She has been seen and evaluated by her primary care with concern for possible urinary tract infections and yeast infections. She has had multiple urinalysis over the past month that show blood, although associated urine cultures have also grown E.coli. She has been treated with several rounds of antibiotics.   Her urinalysis today shows persistent blood.   She also felt like she was having a kidney stone event, complaining of bilateral flank pain. She does have a left UPJ stone measuring 7 mm within the renal pelvis. There is no overt hydronephrosis, although the axis of the kidney is slightly abnormal and does have a fuller renal pelvis. Possibly low-grade UPJ obstruction when compared to previous study. She also has extensive cortical scarring on the contralateral right side. The stone to skin distance is 10.5 and it is around 1000 Hounsfield units.   Today, she reports continued urinary urgency. She reports that her other urinary symptoms have resolved since beginning on antibiotics.    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)   Heart murmur    OUTGREW   History of febrile seizure    History of kidney stones    History of pneumonia    Kidney stone    Migraines    OCD (obsessive  compulsive disorder)    Oral herpes    Pneumonia    PONV (postoperative nausea and vomiting)    Postpartum depression    Pre-diabetes    Psoriasis    PTSD (post-traumatic stress disorder)    Recurrent UTI    Rosacea    Sleep apnea    Strep pharyngitis 06/29/2022    Surgical History: Past Surgical History:  Procedure Laterality Date   ABDOMINAL SURGERY  2012   Endometrial Surgery - went through c-section scar   CARPAL TUNNEL RELEASE Right 08/07/2021   Procedure: RIGHT CARPAL TUNNEL RELEASE;  Surgeon: Marlyne Beards, MD;  Location: [facility];  Service: Orthopedics;  Laterality: Right;   CARPAL TUNNEL RELEASE Left 08/30/2021   Procedure: LEFT CARPAL TUNNEL RELEASE;  Surgeon: Marlyne Beards, MD;  Location: [facility];  Service: Orthopedics;  Laterality: Left;   CESAREAN SECTION  2019   CESAREAN SECTION  2007   CESAREAN SECTION  2014   CESAREAN SECTION  2016   ENDOMETRIAL BIOPSY  2011   GASTROSCHISIS CLOSURE     LEEP     x 2   VENTRAL HERNIA REPAIR N/A 12/11/2019   Procedure: HERNIA REPAIR VENTRAL ADULT;  Surgeon: Carolan Shiver, MD;  Location: [facility];  Service: General;  Laterality: N/A;   WISDOM TOOTH EXTRACTION     XI ROBOTIC ASSISTED VENTRAL HERNIA N/A 12/11/2019   Procedure: XI ROBOTIC ASSISTED VENTRAL HERNIA Lap vs open;  Surgeon: Carolan Shiver, MD;  Location: [facility];  Service: General;  Laterality: N/A;    Home Medications:  Allergies as of 12/21/2022       Reactions   Other Swelling   SHELLFISH   Phenazopyridine Hives, Rash   Benadryl [diphenhydramine]    Prednisone    Intolerant- skin feels hot   Zomig [zolmitriptan]    Diffuse aches.          Medication List        Accurate as of December 21, 2022  4:43 PM. If you have any questions, ask your nurse or doctor.          Advair Diskus 250-50 MCG/ACT Aepb Generic drug: fluticasone-salmeterol Inhale 1 puff into the lungs in the morning and at  bedtime.   albuterol 108 (90 Base) MCG/ACT inhaler Commonly known as: VENTOLIN HFA Inhale 2 puffs into the lungs every 6 (six) hours as needed for wheezing or shortness of breath.   albuterol (2.5 MG/3ML) 0.083% nebulizer solution Commonly known as: PROVENTIL Inhale 3 mLs (2.5 mg total) by nebulization every 6 (six) hours as needed for wheezing or shortness of breath.   amphetamine-dextroamphetamine 20 MG tablet Commonly known as: Adderall Take 1 tablet (20 mg total) by mouth 2 (two) times daily.   busPIRone 5 MG tablet Commonly known as: BUSPAR Take 2 tablets (10 mg total) by mouth 2 (two) times daily.   FLUoxetine 40 MG capsule Commonly known as: PROZAC Take 1 capsule (40 mg total) by mouth daily.   levonorgestrel 20 MCG/DAY Iud Commonly known as: MIRENA 1 each by Intrauterine route once.   traMADol 50 MG tablet Commonly known as: ULTRAM Take 1 tablet (50 mg total) by mouth every 12 (twelve) hours as needed for up to 5 days for severe pain.        Allergies:  Allergies  Allergen Reactions   Other Swelling    SHELLFISH    Phenazopyridine Hives and Rash   Benadryl [Diphenhydramine]    Prednisone     Intolerant- skin feels hot   Zomig [Zolmitriptan]     Diffuse aches.      Family History: Family History  Problem Relation Age of Onset   Depression Mother    Alcohol abuse Mother    Hernia Mother    Rashes / Skin problems Mother    Thyroid disease Mother    Anxiety disorder Mother    Depression Father    Alcohol abuse Father    Gout Father    Diabetes Father    Asthma Brother    Bipolar disorder Brother    Drug abuse Brother    ADD / ADHD Brother    Colon cancer Paternal Grandmother    Allergic rhinitis Son    Asthma Son    ADD / ADHD Son    Allergic rhinitis Son    ADD / ADHD Son    Breast cancer Maternal Aunt    Lung cancer Maternal Aunt    Ovarian cancer Neg Hx     Social History:  reports that she quit smoking about 11 years ago. Her smoking  use included cigarettes. She started smoking about 14 years ago. She has a 0.9 pack-year smoking history. She has been exposed to tobacco smoke. She has never used smokeless tobacco. She reports current alcohol use of about 2.0 standard drinks of alcohol per week. She reports that she does not use drugs.   Physical Exam: BP 112/73 (BP Location: Left Arm, Patient Position: Sitting,  Cuff Size: Large)   Pulse 94   Ht 4' 11.5" (1.511 m)   Wt 176 lb (79.8 kg)   LMP 11/16/2022 (Exact Date)   BMI 34.95 kg/m   Constitutional:  Alert and oriented, No acute distress. HEENT: [place] AT, moist mucus membranes.  Trachea midline, no masses. Abdomen: Mild tenderness on palpation of the left flank. Neurologic: Grossly intact, no focal deficits, moving all 4 extremities. Psychiatric: Normal mood and affect.   Pertinent Imaging:     EXAM: CT ABDOMEN AND PELVIS WITHOUT CONTRAST  TECHNIQUE: Multidetector CT imaging of the abdomen and pelvis was performed following the standard protocol without IV contrast.  RADIATION DOSE REDUCTION: This exam was performed according to the departmental dose-optimization program which includes automated exposure control, adjustment of the mA and/or kV according to patient size and/or use of iterative reconstruction technique.  COMPARISON:  December 18, 2019.  November 02, 2022.  FINDINGS: Lower chest: No acute abnormality.  Hepatobiliary: No focal liver abnormality is seen. No gallstones, gallbladder wall thickening, or biliary dilatation.  Pancreas: Unremarkable. No pancreatic ductal dilatation or surrounding inflammatory changes.  Spleen: Normal in size without focal abnormality.  Adrenals/Urinary Tract: Adrenal glands appear normal. Bilateral nephrolithiasis is noted with the largest calculus measuring 7 mm in right renal pelvis. No hydronephrosis or renal obstruction is noted. Urinary bladder is unremarkable. Right renal cortical scarring  is noted.  Stomach/Bowel: Stomach is within normal limits. Appendix appears normal. No evidence of bowel wall thickening, distention, or inflammatory changes.  Vascular/Lymphatic: No significant vascular findings are present. No enlarged abdominal or pelvic lymph nodes.  Reproductive: Intrauterine device is noted in lower uterine segment or cervix as noted on recent pelvic ultrasound. No adnexal abnormality is noted.  Other: No ascites or hernia.  Musculoskeletal: No acute or significant osseous findings.  IMPRESSION: Bilateral nonobstructive nephrolithiasis. No hydronephrosis or renal obstruction is noted.  Extensive right renal cortical scarring is noted.  Intrauterine device is noted in lower uterine segment or cervix as noted on recent pelvic ultrasound.   Electronically Signed By: Lupita Raider M.D. On: 12/14/2022 15:26  This was personally reviewed and I agree with the radiologic interpretation.   Assessment & Plan:    1. Left renal pelvic stone - 7 mm stone seen on recent imaging - Likely causing intermittent obstruction pain (ball valve) - Unclear whether or note this is related to her infections - Would recommend treating this stone based on the size, location, and suspected chronicity - We discussed various treatment options for urolithiasis including observation with or without medical expulsive therapy, shockwave lithotripsy (SWL), ureteroscopy and laser lithotripsy with stent placement, and percutaneous nephrolithotomy.   We discussed that management is based on stone size, location, density, patient co-morbidities, and patient preference.    Stones <46mm in size have a >80% spontaneous passage rate. Data surrounding the use of tamsulosin for medical expulsive therapy is controversial, but meta analyses suggests it is most efficacious for distal stones between 5-52mm in size. Possible side effects include dizziness/lightheadedness, and retrograde  ejaculation.   SWL has a lower stone free rate in a single procedure, but also a lower complication rate compared to ureteroscopy and avoids a stent and associated stent related symptoms. Possible complications include renal hematoma, steinstrasse, and need for additional treatment. We discussed the role of his increased skin to stone distance can lead to decreased efficacy with shockwave lithotripsy.   Ureteroscopy with laser lithotripsy and stent placement has a higher stone free rate than SWL  in a single procedure, however increased complication rate including possible infection, ureteral injury, bleeding, and stent related morbidity. Common stent related symptoms include dysuria, urgency/frequency, and flank pain.   After an extensive discussion of the risks and benefits of the above treatment options, the patient would like to proceed with SWL.  2. Recurrent UTI - Urine culture to be repeated for pre-op clearance - Continue monitoring for symptoms of UTI.   Return for SWL.  I have reviewed the above documentation for accuracy and completeness, and I agree with the above.   Vanna Scotland, MD   [facility] [address] [place], [place] [address] [phone]

## 2022-12-21 NOTE — Patient Instructions (Signed)
ESWL for Kidney Stones  Extracorporeal shock wave lithotripsy (ESWL) is a treatment that can help break up kidney stones that are too large to pass on their own.  This is a nonsurgical procedure that breaks up a kidney stone with shock waves. These shock waves pass through your body and focus on the kidney stone. They cause the kidney stone to break into smaller pieces (fragments) while it is still in the urinary tract. The fragments of stone can pass more easily out of your body in the urine. Tell a health care provider about: Any allergies you have. All medicines you are taking, including vitamins, herbs, eye drops, creams, and over-the-counter medicines. Any problems you or family members have had with anesthetic medicines. Any bleeding problems you have. Any surgeries you have had. Any medical conditions you have. Whether you are pregnant or may be pregnant. What are the risks? Your health care provider will talk with you about risks. These may include: Infection. Bleeding from the kidney. Bruising of the kidney or skin. Scarring of the kidney. This can lead to: Increased blood pressure. Poor kidney function. Return (recurrence) of kidney stones. Damage to other structures or organs. This may include the liver, colon, spleen, or pancreas. Blockage (obstruction) of the tube that carries urine from the kidney to the bladder (ureter). Failure of the kidney stone to break into fragments. What happens before the procedure? When to stop eating and drinking Follow instructions from your health care provider about what you may eat and drink. These may include: 8 hours before your procedure Stop eating most foods. Do not eat meat, fried foods, or fatty foods. Eat only light foods, such as toast or crackers. All liquids are okay except energy drinks and alcohol. 6 hours before your procedure Stop eating. Drink only clear liquids, such as water, clear fruit juice, black coffee, plain tea,  and sports drinks. Do not drink energy drinks or alcohol. 2 hours before your procedure Stop drinking all liquids. You may be allowed to take medicines with small sips of water. If you do not follow your health care provider's instructions, your procedure may be delayed or canceled. Medicines Ask your health care provider about: Changing or stopping your regular medicines. These include any diabetes medicines or blood thinners you take. Taking medicines such as aspirin and ibuprofen. These medicines can thin your blood. Do not take them unless your health care provider tells you to. Taking over-the-counter medicines, vitamins, herbs, and supplements. Tests You may have tests, such as: Blood tests. Urine tests. Imaging tests. This may include a CT scan. Surgery safety Ask your health care provider: How your surgery site will be marked. What steps will be taken to help prevent infection. These steps may include: Washing skin with a soap that kills germs. Receiving antibiotics. General instructions If you will be going home right after the procedure, plan to have a responsible adult: Take you home from the hospital or clinic. You will not be allowed to drive. Care for you for the time you are told. What happens during the procedure?  An IV will be inserted into one of your veins. You may be given: A sedative. This helps you relax. Anesthesia. This will: Numb certain areas of your body. Make you fall asleep for surgery. A water-filled cushion may be placed behind your kidney or on your abdomen. In some cases, you may be placed in a tub of lukewarm water. Your body will be positioned in a way that makes it  easier to target the kidney stone. An X-ray or ultrasound exam will be done to locate your stone. Shock waves will be aimed at the stone. If you are awake, you may feel a tapping sensation as the shock waves pass through your body. A small mesh tube (stent) may be placed in your  ureter. This will help keep urine flowing from the kidney if the fragments of the stone have been blocking the ureter. The stent will be removed at a later time by your health care provider. The procedure may vary among health care providers and hospitals. What happens after the procedure? Your blood pressure, heart rate, breathing rate, and blood oxygen level will be monitored until you leave the hospital or clinic. You may have an X-ray after the procedure to see how many of the kidney stones were broken up. This will also show how much of the stone has passed. If there are still large fragments after treatment, you may need to have a second procedure at a later time. This information is not intended to replace advice given to you by your health care provider. Make sure you discuss any questions you have with your health care provider. Document Revised: 06/29/2022 Document Reviewed: 08/03/2021 Elsevier Patient Education  2024 [employer].

## 2022-12-22 LAB — URINE CULTURE: Culture: <10000 — AB

## 2022-12-24 ENCOUNTER — Other Ambulatory Visit: Payer: Self-pay

## 2022-12-24 DIAGNOSIS — N200 Calculus of kidney: Secondary | ICD-10-CM

## 2022-12-24 NOTE — Progress Notes (Signed)
ESWL ORDER FORM  Expected date of procedure: next  Surgeon: assigned  Post op standing: 2-4wk follow up w/KUB prior  Anticoagulation/Aspirin/NSAID standing order: Hold all 72 hours prior  Anesthesia standing order: MAC  VTE standing: SCD's  Dx: Left Nephrolihtiasis  Procedure: Left Extracorporeal shock wave lithotripsy  CPT : 50590  Standing Order Set:   *NPO after mn, KUB  *NS 175ml/hr, Keflex 500mg  PO, Benadryl 25mg  PO, Valium 10mg  PO, Zofran 4mg  IV    Medications if other than standing orders:  n/a

## 2022-12-26 ENCOUNTER — Encounter: Payer: Self-pay | Admitting: Anesthesiology

## 2022-12-26 MED ORDER — SODIUM CHLORIDE 0.9 % IV SOLN: INTRAVENOUS | Status: DC

## 2022-12-26 MED ORDER — ONDANSETRON HCL 4 MG/2ML IJ SOLN
4.0000 mg | Freq: Once | INTRAMUSCULAR | Status: AC
  Administered 2022-12-27: 4 mg via INTRAVENOUS

## 2022-12-26 MED ORDER — DIAZEPAM 5 MG PO TABS
10.0000 mg | ORAL_TABLET | ORAL | Status: AC
  Administered 2022-12-27: 10 mg via ORAL

## 2022-12-26 MED ORDER — DIPHENHYDRAMINE HCL 25 MG PO CAPS: 25.0000 mg | ORAL_CAPSULE | ORAL | Status: DC

## 2022-12-26 MED ORDER — CEPHALEXIN 500 MG PO CAPS
500.0000 mg | ORAL_CAPSULE | Freq: Once | ORAL | Status: AC
  Administered 2022-12-27: 500 mg via ORAL

## 2022-12-27 ENCOUNTER — Encounter: Payer: Self-pay | Admitting: Urology

## 2022-12-27 ENCOUNTER — Ambulatory Visit
Admission: RE | Admit: 2022-12-27 | Discharge: 2022-12-27 | Disposition: A | Discharge status: Home / Self Care | Payer: BC Managed Care – PPO | Source: Ambulatory Visit | Attending: Urology | Admitting: Urology

## 2022-12-27 ENCOUNTER — Encounter
Admission: RE | Disposition: A | Discharge status: Home / Self Care | Payer: Self-pay | Source: Ambulatory Visit | Attending: Urology

## 2022-12-27 ENCOUNTER — Other Ambulatory Visit: Payer: Self-pay

## 2022-12-27 ENCOUNTER — Ambulatory Visit: Payer: BC Managed Care – PPO

## 2022-12-27 DIAGNOSIS — Z01818 Encounter for other preprocedural examination: Secondary | ICD-10-CM

## 2022-12-27 DIAGNOSIS — G4730 Sleep apnea, unspecified: Secondary | ICD-10-CM | POA: Diagnosis not present

## 2022-12-27 DIAGNOSIS — N200 Calculus of kidney: Secondary | ICD-10-CM | POA: Insufficient documentation

## 2022-12-27 DIAGNOSIS — Z87440 Personal history of urinary (tract) infections: Secondary | ICD-10-CM | POA: Insufficient documentation

## 2022-12-27 DIAGNOSIS — Z833 Family history of diabetes mellitus: Secondary | ICD-10-CM | POA: Insufficient documentation

## 2022-12-27 HISTORY — PX: EXTRACORPOREAL SHOCK WAVE LITHOTRIPSY: SHX1557

## 2022-12-27 LAB — POCT PREGNANCY, URINE: Preg Test, Ur: NEGATIVE

## 2022-12-27 SURGERY — LITHOTRIPSY, ESWL
Anesthesia: Moderate Sedation | Laterality: Left

## 2022-12-27 MED ORDER — OXYCODONE-ACETAMINOPHEN 5-325 MG PO TABS
1.0000 | ORAL_TABLET | Freq: Four times a day (QID) | ORAL | 0 refills | Status: AC | PRN
Start: 2022-12-27 — End: 2022-12-30

## 2022-12-27 MED ORDER — DIPHENHYDRAMINE HCL 25 MG PO CAPS
ORAL_CAPSULE | ORAL | Status: AC
  Filled 2022-12-27: qty 1

## 2022-12-27 MED ORDER — ONDANSETRON HCL 4 MG/2ML IJ SOLN
INTRAMUSCULAR | Status: AC
  Filled 2022-12-27: qty 2

## 2022-12-27 MED ORDER — TAMSULOSIN HCL 0.4 MG PO CAPS: 0.4000 mg | ORAL_CAPSULE | Freq: Every day | ORAL | 0 refills | Status: DC

## 2022-12-27 MED ORDER — DIAZEPAM 5 MG PO TABS
ORAL_TABLET | ORAL | Status: AC
  Filled 2022-12-27: qty 2

## 2022-12-27 MED ORDER — CEPHALEXIN 500 MG PO CAPS
ORAL_CAPSULE | ORAL | Status: AC
  Filled 2022-12-27: qty 1

## 2022-12-27 NOTE — Brief Op Note (Signed)
12/27/2022  11:40 AM  PATIENT:  Crystal Hammond  42 y.o. female  PRE-OPERATIVE DIAGNOSIS:  Left 7mm renal/UPJ stone  POST-OPERATIVE DIAGNOSIS:  Same  PROCEDURE:  Procedure(s): EXTRACORPOREAL SHOCK WAVE LITHOTRIPSY (ESWL) (Left)  SURGEON:  Surgeons and Role:    * Sondra Come, MD - Primary  ANESTHESIA: Conscious Sedation  EBL:  None  Drains: None  Specimen: None  Findings:  1.Uncomplicated SWL, stone appeared to smudge  DISPO: Flomax, pain meds PRN, RTC 2 weeks KUB  Legrand Rams, MD 12/27/2022

## 2022-12-27 NOTE — Discharge Instructions (Signed)

## 2022-12-27 NOTE — Interval H&P Note (Signed)
UROLOGY H&P UPDATE  Agree with prior H&P dated 12/21/22 by Dr Apolinar Junes, 7mm left UPJ/renal pelvis stones  Cardiac: RRR Lungs: CTA bilaterally  Laterality: LEFT Procedure: Shockwave lithotripsy  Urine: culture 9/6 no growth  Informed consent obtained, we specifically discussed the risks of bleeding/hematoma, infection, post-operative pain/obstructive fragments, need for additional procedures including ureteroscopy.  Sondra Come, MD 12/27/2022

## 2022-12-28 ENCOUNTER — Other Ambulatory Visit: Payer: Self-pay

## 2022-12-28 ENCOUNTER — Encounter: Payer: Self-pay | Admitting: Urology

## 2022-12-28 DIAGNOSIS — N200 Calculus of kidney: Secondary | ICD-10-CM

## 2023-01-04 ENCOUNTER — Other Ambulatory Visit: Payer: Self-pay | Admitting: Urology

## 2023-01-04 ENCOUNTER — Encounter: Payer: Self-pay | Admitting: Urology

## 2023-01-04 MED ORDER — SULFAMETHOXAZOLE-TRIMETHOPRIM 800-160 MG PO TABS: 1.0000 | ORAL_TABLET | Freq: Two times a day (BID) | ORAL | 0 refills | Status: DC

## 2023-01-10 ENCOUNTER — Encounter: Payer: Self-pay | Admitting: Internal Medicine

## 2023-01-10 ENCOUNTER — Ambulatory Visit (INDEPENDENT_AMBULATORY_CARE_PROVIDER_SITE_OTHER): Payer: BC Managed Care – PPO | Admitting: Internal Medicine

## 2023-01-10 VITALS — BP 90/60 | HR 95 | Temp 98.4°F | Ht 59.5 in | Wt 177.0 lb

## 2023-01-10 DIAGNOSIS — M7989 Other specified soft tissue disorders: Secondary | ICD-10-CM | POA: Diagnosis not present

## 2023-01-10 NOTE — Assessment & Plan Note (Signed)
Localized No joint involvement No generalization Reassured--most likely an inadvertent bug bite Can try cool compresses

## 2023-01-10 NOTE — Progress Notes (Signed)
Subjective:    Patient ID: Crystal Hammond, female    DOB: 22-Jan-1981, 42 y.o.   MRN: [id number]  HPI Here due to left arm swelling  Yesterday started with arm itching Then noticed her left wrist was "swollen like an egg" Actually up from the wrist  Looked for bug bite--but didn't see anything No injury   Swelling may be some better today Tried tylenol  Current Outpatient Medications on File Prior to Visit  Medication Sig Dispense Refill   albuterol (PROVENTIL) (2.5 MG/3ML) 0.083% nebulizer solution Inhale 3 mLs (2.5 mg total) by nebulization every 6 (six) hours as needed for wheezing or shortness of breath. 150 mL 1   albuterol (VENTOLIN HFA) 108 (90 Base) MCG/ACT inhaler Inhale 2 puffs into the lungs every 6 (six) hours as needed for wheezing or shortness of breath. 18 g 0   amphetamine-dextroamphetamine (ADDERALL) 20 MG tablet Take 1 tablet (20 mg total) by mouth 2 (two) times daily. 60 tablet 0   busPIRone (BUSPAR) 5 MG tablet Take 2 tablets (10 mg total) by mouth 2 (two) times daily. 360 tablet 1   FLUoxetine (PROZAC) 40 MG capsule Take 1 capsule (40 mg total) by mouth daily. 90 capsule 1   fluticasone-salmeterol (ADVAIR DISKUS) 250-50 MCG/ACT AEPB Inhale 1 puff into the lungs in the morning and at bedtime. 60 each 11   levonorgestrel (MIRENA) 20 MCG/DAY IUD 1 each by Intrauterine route once.     sulfamethoxazole-trimethoprim (BACTRIM DS) 800-160 MG tablet Take 1 tablet by mouth 2 (two) times daily. 14 tablet 0   tamsulosin (FLOMAX) 0.4 MG CAPS capsule Take 1 capsule (0.4 mg total) by mouth daily after supper. 14 capsule 0   No current facility-administered medications on file prior to visit.    Allergies  Allergen Reactions   Other Swelling    SHELLFISH    Phenazopyridine Hives and Rash   Benadryl [Diphenhydramine]    Prednisone     Intolerant- skin feels hot   Zomig [Zolmitriptan]     Diffuse aches.      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)   Heart murmur    OUTGREW   History of febrile seizure    History of kidney stones    History of pneumonia    Kidney stone    Migraines    OCD (obsessive compulsive disorder)    Oral herpes    Pneumonia    PONV (postoperative nausea and vomiting)    Postpartum depression    Pre-diabetes    Psoriasis    PTSD (post-traumatic stress disorder)    Recurrent UTI    Rosacea    Sleep apnea    Strep pharyngitis 06/29/2022    Past Surgical History:  Procedure Laterality Date   ABDOMINAL SURGERY  2012   Endometrial Surgery - went through c-section scar   CARPAL TUNNEL RELEASE Right 08/07/2021   Procedure: RIGHT CARPAL TUNNEL RELEASE;  Surgeon: Marlyne Beards, MD;  Location: [facility];  Service: Orthopedics;  Laterality: Right;   CARPAL TUNNEL RELEASE Left 08/30/2021   Procedure: LEFT CARPAL TUNNEL RELEASE;  Surgeon: Marlyne Beards, MD;  Location: [facility];  Service: Orthopedics;  Laterality: Left;   CESAREAN SECTION  2019   CESAREAN SECTION  2007  CESAREAN SECTION  2014   CESAREAN SECTION  2016   ENDOMETRIAL BIOPSY  2011   EXTRACORPOREAL SHOCK WAVE LITHOTRIPSY Left 12/27/2022   Procedure: EXTRACORPOREAL SHOCK WAVE LITHOTRIPSY (ESWL);  Surgeon: Sondra Come, MD;  Location: [facility];  Service: Urology;  Laterality: Left;   GASTROSCHISIS CLOSURE     LEEP     x 2   VENTRAL HERNIA REPAIR N/A 12/11/2019   Procedure: HERNIA REPAIR VENTRAL ADULT;  Surgeon: Carolan Shiver, MD;  Location: [facility];  Service: General;  Laterality: N/A;   WISDOM TOOTH EXTRACTION     XI ROBOTIC ASSISTED VENTRAL HERNIA N/A 12/11/2019   Procedure: XI ROBOTIC ASSISTED VENTRAL HERNIA Lap vs open;  Surgeon: Carolan Shiver, MD;  Location: [facility];  Service: General;  Laterality: N/A;    Family  History  Problem Relation Age of Onset   Depression Mother    Alcohol abuse Mother    Hernia Mother    Rashes / Skin problems Mother    Thyroid disease Mother    Anxiety disorder Mother    Depression Father    Alcohol abuse Father    Gout Father    Diabetes Father    Asthma Brother    Bipolar disorder Brother    Drug abuse Brother    ADD / ADHD Brother    Colon cancer Paternal Grandmother    Allergic rhinitis Son    Asthma Son    ADD / ADHD Son    Allergic rhinitis Son    ADD / ADHD Son    Breast cancer Maternal Aunt    Lung cancer Maternal Aunt    Ovarian cancer Neg Hx     Social History   Socioeconomic History   Marital status: Married    Spouse name: Dustin   Number of children: 4   Years of education: Not on file   Highest education level: Associate degree: occupational, Scientist, product/process development, or vocational program  Occupational History   Occupation: Curator: [employer]  Tobacco Use   Smoking status: Former    Current packs/day: 0.00    Average packs/day: 0.3 packs/day for 3.0 years (0.9 ttl pk-yrs)    Types: Cigarettes    Start date: 2010    Quit date: 2013    Years since quitting: 11.7    Passive exposure: Past   Smokeless tobacco: Never  Vaping Use   Vaping status: Never Used  Substance and Sexual Activity   Alcohol use: Yes    Alcohol/week: 2.0 standard drinks of alcohol    Types: 2 Cans of beer per week   Drug use: Never   Sexual activity: Yes    Birth control/protection: None  Other Topics Concern   Not on file  Social History Narrative   Three sons and one daughter. Second oldest has autism spectrum disorder and two oldest ADHD.   Right handed   Caffeine- 1 cup per day    Social Determinants of Health   Financial Resource Strain: Low Risk  (10/08/2022)   Overall Financial Resource Strain (CARDIA)    Difficulty of Paying Living Expenses: Not very hard  Food Insecurity: No Food Insecurity (10/08/2022)   Hunger Vital Sign    Worried  About Running Out of Food in the Last Year: Never true    Ran Out of Food in the Last Year: Never true  Transportation Needs: No Transportation Needs (10/08/2022)   PRAPARE - Administrator, Civil Service (Medical): No  Lack of Transportation (Non-Medical): No  Physical Activity: Sufficiently Active (10/08/2022)   Exercise Vital Sign    Days of Exercise per Week: 3 days    Minutes of Exercise per Session: 60 min  Stress: Stress Concern Present (10/08/2022)   [employer] of Occupational Health - Occupational Stress Questionnaire    Feeling of Stress : Very much  Social Connections: Moderately Isolated (10/08/2022)   Social Connection and Isolation Panel [NHANES]    Frequency of Communication with Friends and Family: More than three times a week    Frequency of Social Gatherings with Friends and Family: Twice a week    Attends Religious Services: Never    Database administrator or Organizations: No    Attends Engineer, structural: Not on file    Marital Status: Married  Catering manager Violence: Not on file   Review of Systems Recent lithotripsy--2 weeks ago Now on antibiotic for UTI from Dr Apolinar Junes No fever--but gets hot flashes easily    Objective:   Physical Exam Constitutional:      Appearance: Normal appearance.  Musculoskeletal:     Comments: Left wrist not swollen and normal ROM No pain along bones in forearm  Mild soft tissue swelling on extensor distal left forearm No obvious bites No redness or warmth  Neurological:     Mental Status: She is alert.            Assessment & Plan:

## 2023-01-15 DIAGNOSIS — Z419 Encounter for procedure for purposes other than remedying health state, unspecified: Secondary | ICD-10-CM | POA: Diagnosis not present

## 2023-01-16 ENCOUNTER — Other Ambulatory Visit: Payer: Self-pay

## 2023-01-16 ENCOUNTER — Ambulatory Visit
Admission: RE | Admit: 2023-01-16 | Discharge: 2023-01-16 | Disposition: A | Discharge status: Home / Self Care | Payer: BC Managed Care – PPO | Source: Ambulatory Visit | Attending: Urology | Admitting: Urology

## 2023-01-16 ENCOUNTER — Encounter: Payer: Self-pay | Admitting: Physician Assistant

## 2023-01-16 ENCOUNTER — Telehealth: Payer: Self-pay

## 2023-01-16 ENCOUNTER — Ambulatory Visit: Payer: BC Managed Care – PPO | Admitting: Physician Assistant

## 2023-01-16 ENCOUNTER — Ambulatory Visit
Admission: RE | Admit: 2023-01-16 | Discharge: 2023-01-16 | Disposition: A | Discharge status: Home / Self Care | Payer: BC Managed Care – PPO | Attending: Urology | Admitting: Urology

## 2023-01-16 VITALS — BP 111/69 | HR 98 | Ht 59.5 in | Wt 177.0 lb

## 2023-01-16 DIAGNOSIS — N200 Calculus of kidney: Secondary | ICD-10-CM

## 2023-01-16 LAB — URINALYSIS, COMPLETE
Bilirubin, UA: NEGATIVE
Glucose, UA: NEGATIVE
Ketones, UA: NEGATIVE
Leukocytes,UA: NEGATIVE
Nitrite, UA: NEGATIVE
Protein,UA: NEGATIVE
Specific Gravity, UA: 1.015 (ref 1.005–1.030)
Urobilinogen, Ur: 0.2 mg/dL (ref 0.2–1.0)
pH, UA: 7 (ref 5.0–7.5)

## 2023-01-16 LAB — MICROSCOPIC EXAMINATION: Epithelial Cells (non renal): >10 /[HPF] — AB (ref 0–10)

## 2023-01-16 NOTE — Progress Notes (Signed)
Surgical Physician Order Form [facility] [place]  Dr. Apolinar Junes * Scheduling expectation : Next Available  *Length of Case:   *Clearance needed: no  *Anticoagulation Instructions: N/A  *Aspirin Instructions: N/A  *Post-op visit Date/Instructions:  1 month with RUS prior  *Diagnosis: Left Nephrolithiasis  *Procedure: left Ureteroscopy w/laser lithotripsy & stent placement (16109)   Additional orders: N/A  -Admit type: OUTpatient  -Anesthesia: General  -VTE Prophylaxis Standing Order SCD's       Other:   -Standing Lab Orders Per Anesthesia    Lab other: None  -Standing Test orders EKG/Chest x-ray per Anesthesia       Test other:   - Medications:  Ancef 2gm IV  -Other orders:  N/A

## 2023-01-16 NOTE — Progress Notes (Signed)
   [facility] Surgical Posting Form  Surgery Date: Date: 02/11/2023  Surgeon: Dr. Vanna Scotland, MD  Inpt ( No  )   Outpt (Yes)   Obs ( No  )   Diagnosis: N20.0 Left Nephrolithiasis  -CPT: [phone]  Surgery: Left Ureteroscopy with Laser Lithotripsy and Stent Placement  Stop Anticoagulations: N/A  Cardiac/Medical/Pulmonary Clearance needed: no  *Orders entered into EPIC  Date: 01/16/23   *Case booked in [place]  Date: 01/16/23  *Notified pt of Surgery: Date: 01/16/23  PRE-OP UA & CX: no  *Placed into Prior Authorization Work [place] Date: 01/16/23  Assistant/laser/rep:No

## 2023-01-16 NOTE — Telephone Encounter (Signed)
Per Dr. Apolinar Junes, Patient is to be scheduled for Left Ureteroscopy with Laser Lithotripsy and Stent Placement   Crystal Hammond was contacted and possible surgical dates were discussed, Monday October 28th, 2024 was agreed upon for surgery.   Patient was directed to call [phone] between 1-3pm the day before surgery to find out surgical arrival time.  Instructions were given not to eat or drink from midnight on the night before surgery and have a driver for the day of surgery. On the surgery day patient was instructed to enter through the Medical Mall entrance of [facility] report the Same Day Surgery desk.   Pre-Admit Testing will be in contact via phone to set up an interview with the anesthesia team to review your history and medications prior to surgery.   Reminder of this information was sent via MyChart to the patient.

## 2023-01-19 LAB — CULTURE, URINE COMPREHENSIVE

## 2023-01-28 ENCOUNTER — Telehealth: Payer: Self-pay

## 2023-01-28 ENCOUNTER — Other Ambulatory Visit: Payer: Self-pay

## 2023-01-28 DIAGNOSIS — N200 Calculus of kidney: Secondary | ICD-10-CM

## 2023-01-28 MED ORDER — CIPROFLOXACIN HCL 250 MG PO TABS
250.0000 mg | ORAL_TABLET | Freq: Two times a day (BID) | ORAL | 0 refills | Status: AC
Start: 2023-01-28 — End: 2023-02-02

## 2023-01-28 MED ORDER — CIPROFLOXACIN HCL 250 MG PO TABS
250.0000 mg | ORAL_TABLET | Freq: Two times a day (BID) | ORAL | 0 refills | Status: DC
Start: 2023-01-28 — End: 2023-01-28

## 2023-01-28 NOTE — Telephone Encounter (Signed)
-----   Message from [facility] M sent at 01/28/2023  2:46 PM EDT ----- Pt states she wants more information on upcoming surgery. Tried explain to the patient she is going a different procedure done than before. Can you please give her a call to give her more information.

## 2023-01-28 NOTE — Telephone Encounter (Signed)
Spoke with pt. Discussed differences in URS versus Litho. Patient concerned about pain being significantly worse on Right side versus left. Spoke with Sam about this case. She reviewed imaging. States that patient has punctate stones on right and should not be causing pain. Offered for patient to have a Renal Ultrasound to make sure no swelling in kidney that was not previously seen in prior scan. Patient would like to proceed with this, wants to still have the URS on the Left side to address the stone.

## 2023-01-28 NOTE — Telephone Encounter (Signed)
Error

## 2023-02-01 ENCOUNTER — Other Ambulatory Visit: Payer: Self-pay

## 2023-02-01 ENCOUNTER — Encounter: Payer: Self-pay | Admitting: Urology

## 2023-02-01 ENCOUNTER — Encounter
Admission: RE | Admit: 2023-02-01 | Discharge: 2023-02-01 | Disposition: A | Discharge status: Home / Self Care | Payer: BC Managed Care – PPO | Source: Ambulatory Visit | Attending: Urology | Admitting: Urology

## 2023-02-01 VITALS — Ht 59.5 in | Wt 177.0 lb

## 2023-02-01 DIAGNOSIS — N200 Calculus of kidney: Secondary | ICD-10-CM

## 2023-02-01 DIAGNOSIS — Z01812 Encounter for preprocedural laboratory examination: Secondary | ICD-10-CM

## 2023-02-01 NOTE — Plan of Care (Signed)
CHL Tonsillectomy/Adenoidectomy, Postoperative PEDS care plan entered in error.

## 2023-02-01 NOTE — Patient Instructions (Addendum)
Your procedure is scheduled on: 02/11/2023 Monday  Report to the Registration Desk on the 1st floor of the Medical Mall. To find out your arrival time, please call [phone] between 1PM - 3PM on: 02/10/2023 If your arrival time is 6:00 am, do not arrive before that time as the Medical Mall entrance doors do not open until 6:00 am.  REMEMBER: Instructions that are not followed completely may result in serious medical risk, up to and including death; or upon the discretion of your surgeon and anesthesiologist your surgery may need to be rescheduled.  Do not eat food after midnight the night before surgery.  No gum chewing or hard candies.  One week prior to surgery: Stop Anti-inflammatories (NSAIDS) such as Advil, Aleve, Ibuprofen, Motrin, Naproxen, Naprosyn and Aspirin based products such as Excedrin, Goody's Powder, BC Powder. Stop ANY OVER THE COUNTER supplements until after surgery.  You may however, continue to take Tylenol if needed for pain up until the day of surgery.   Continue taking all of your other prescription medications up until the day of surgery.  ON THE DAY OF SURGERY ONLY TAKE THESE MEDICATIONS WITH SIPS OF WATER:   amphetamine-dextroamphetamine (ADDERALL) 20 MG tablet busPIRone (BUSPAR) FLUoxetine (PROZAC) 40 MG capsule   Use your nebulizer and inhaler as prescribed and bring albuterol to hospital in case you need it.   No Alcohol for 24 hours before or after surgery.  No Smoking including e-cigarettes for 24 hours before surgery.  No chewable tobacco products for at least 6 hours before surgery.  No nicotine patches on the day of surgery.  Do not use any "recreational" drugs for at least a week (preferably 2 weeks) before your surgery.  Please be advised that the combination of cocaine and anesthesia may have negative outcomes, up to and including death. If you test positive for cocaine, your surgery will be cancelled.  On the morning of surgery brush  your teeth with toothpaste and water, you may rinse your mouth with mouthwash if you wish. Do not swallow any toothpaste or mouthwash.  You need to shower on day of surgery  Do not wear jewelry, make-up, hairpins, clips or nail polish.  For welded (permanent) jewelry: bracelets, anklets, waist bands, etc.  Please have this removed prior to surgery.  If it is not removed, there is a chance that hospital personnel will need to cut it off on the day of surgery.  Do not wear lotions, powders, or perfumes.   Do not shave body hair from the neck down 48 hours before surgery.  Contact lenses, hearing aids and dentures may not be worn into surgery.  Do not bring valuables to the hospital. [facility] is not responsible for any missing/lost belongings or valuables.    Bring your C-PAP to the hospital in case you may have to spend the night.   Notify your doctor if there is any change in your medical condition (cold, fever, infection).  Wear comfortable clothing (specific to your surgery type) to the hospital.  After surgery, you can help prevent lung complications by doing breathing exercises.  Take deep breaths and cough every 1-2 hours. Your doctor may order a device called an Incentive Spirometer to help you take deep breaths. If you are being admitted to the hospital overnight, leave your suitcase in the car. After surgery it may be brought to your room.   If you are being discharged the day of surgery, you will not be allowed to drive home.  You will need a responsible individual to drive you home and stay with you for 24 hours after surgery.    Please call the Pre-admissions Testing Dept. at [phone] if you have any questions about these instructions.  Surgery Visitation Policy:  Patients having surgery or a procedure may have two visitors.  Children under the age of 60 must have an adult with them who is not the patient.

## 2023-02-04 ENCOUNTER — Ambulatory Visit
Admission: RE | Admit: 2023-02-04 | Discharge: 2023-02-04 | Disposition: A | Discharge status: Home / Self Care | Payer: BC Managed Care – PPO | Source: Ambulatory Visit | Attending: Family Medicine | Admitting: Family Medicine

## 2023-02-04 ENCOUNTER — Encounter: Payer: Self-pay | Admitting: Urgent Care

## 2023-02-04 ENCOUNTER — Encounter
Admission: RE | Admit: 2023-02-04 | Discharge: 2023-02-04 | Disposition: A | Discharge status: Home / Self Care | Payer: BC Managed Care – PPO | Source: Ambulatory Visit | Attending: Urology | Admitting: Urology

## 2023-02-04 ENCOUNTER — Ambulatory Visit
Admission: RE | Admit: 2023-02-04 | Discharge: 2023-02-04 | Disposition: A | Discharge status: Home / Self Care | Payer: BC Managed Care – PPO | Source: Ambulatory Visit | Attending: Physician Assistant | Admitting: Physician Assistant

## 2023-02-04 DIAGNOSIS — Z1231 Encounter for screening mammogram for malignant neoplasm of breast: Secondary | ICD-10-CM | POA: Diagnosis present

## 2023-02-04 DIAGNOSIS — R29890 Loss of height: Secondary | ICD-10-CM | POA: Insufficient documentation

## 2023-02-04 DIAGNOSIS — N200 Calculus of kidney: Secondary | ICD-10-CM | POA: Insufficient documentation

## 2023-02-04 DIAGNOSIS — Z01812 Encounter for preprocedural laboratory examination: Secondary | ICD-10-CM

## 2023-02-04 DIAGNOSIS — Z01818 Encounter for other preprocedural examination: Secondary | ICD-10-CM | POA: Diagnosis present

## 2023-02-04 LAB — BASIC METABOLIC PANEL
Anion gap: 7 (ref 5–15)
BUN: 9 mg/dL (ref 6–20)
CO2: 25 mmol/L (ref 22–32)
Calcium: 8.7 mg/dL — ABNORMAL LOW (ref 8.9–10.3)
Chloride: 102 mmol/L (ref 98–111)
Creatinine, Ser: 0.94 mg/dL (ref 0.44–1.00)
GFR, Estimated: >60 mL/min (ref >60)
Glucose, Bld: 80 mg/dL (ref 70–99)
Potassium: 3.3 mmol/L — ABNORMAL LOW (ref 3.5–5.1)
Sodium: 134 mmol/L — ABNORMAL LOW (ref 135–145)

## 2023-02-04 LAB — CBC
HCT: 34.8 % — ABNORMAL LOW (ref 36.0–46.0)
Hemoglobin: 11.5 g/dL — ABNORMAL LOW (ref 12.0–15.0)
MCH: 27.5 pg (ref 26.0–34.0)
MCHC: 33 g/dL (ref 30.0–36.0)
MCV: 83.3 fL (ref 80.0–100.0)
Platelets: 289 10*3/uL (ref 150–400)
RBC: 4.18 MIL/uL (ref 3.87–5.11)
RDW: 13.6 % (ref 11.5–15.5)
WBC: 10 10*3/uL (ref 4.0–10.5)
nRBC: 0 % (ref 0.0–0.2)

## 2023-02-09 ENCOUNTER — Other Ambulatory Visit: Payer: Self-pay | Admitting: Family Medicine

## 2023-02-11 ENCOUNTER — Ambulatory Visit: Payer: BC Managed Care – PPO | Admitting: General Practice

## 2023-02-11 ENCOUNTER — Ambulatory Visit
Admission: RE | Admit: 2023-02-11 | Discharge: 2023-02-11 | Disposition: A | Discharge status: Home / Self Care | Payer: BC Managed Care – PPO | Attending: Urology | Admitting: Urology

## 2023-02-11 ENCOUNTER — Encounter
Admission: RE | Disposition: A | Discharge status: Home / Self Care | Payer: Self-pay | Source: Home / Self Care | Attending: Urology

## 2023-02-11 ENCOUNTER — Ambulatory Visit: Payer: BC Managed Care – PPO

## 2023-02-11 ENCOUNTER — Encounter: Payer: Self-pay | Admitting: Urology

## 2023-02-11 ENCOUNTER — Other Ambulatory Visit: Payer: Self-pay

## 2023-02-11 DIAGNOSIS — N200 Calculus of kidney: Secondary | ICD-10-CM | POA: Diagnosis not present

## 2023-02-11 DIAGNOSIS — Z01812 Encounter for preprocedural laboratory examination: Secondary | ICD-10-CM

## 2023-02-11 HISTORY — PX: CYSTOSCOPY/URETEROSCOPY/HOLMIUM LASER/STENT PLACEMENT: SHX6546

## 2023-02-11 HISTORY — DX: Calculus of kidney: N20.0

## 2023-02-11 LAB — POCT PREGNANCY, URINE: Preg Test, Ur: NEGATIVE

## 2023-02-11 SURGERY — CYSTOSCOPY/URETEROSCOPY/HOLMIUM LASER/STENT PLACEMENT
Anesthesia: General | Laterality: Left

## 2023-02-11 MED ORDER — OXYCODONE HCL 5 MG/5ML PO SOLN: 5.0000 mg | Freq: Once | ORAL | Status: AC | PRN

## 2023-02-11 MED ORDER — FENTANYL CITRATE (PF) 100 MCG/2ML IJ SOLN
25.0000 ug | INTRAMUSCULAR | Status: DC | PRN
Start: 2023-02-11 — End: 2023-02-11

## 2023-02-11 MED ORDER — AMPHETAMINE-DEXTROAMPHETAMINE 20 MG PO TABS: 20.0000 mg | ORAL_TABLET | Freq: Two times a day (BID) | ORAL | 0 refills | Status: DC

## 2023-02-11 MED ORDER — ROCURONIUM BROMIDE 100 MG/10ML IV SOLN
INTRAVENOUS | Status: DC | PRN
  Administered 2023-02-11: 40 mg via INTRAVENOUS

## 2023-02-11 MED ORDER — ROCURONIUM BROMIDE 10 MG/ML (PF) SYRINGE
PREFILLED_SYRINGE | INTRAVENOUS | Status: AC
  Filled 2023-02-11: qty 10

## 2023-02-11 MED ORDER — DEXAMETHASONE SODIUM PHOSPHATE 10 MG/ML IJ SOLN
INTRAMUSCULAR | Status: AC
Start: 2023-02-11 — End: ?
  Filled 2023-02-11: qty 1

## 2023-02-11 MED ORDER — ONDANSETRON HCL 4 MG/2ML IJ SOLN
INTRAMUSCULAR | Status: DC | PRN
  Administered 2023-02-11: 4 mg via INTRAVENOUS

## 2023-02-11 MED ORDER — DEXAMETHASONE SODIUM PHOSPHATE 10 MG/ML IJ SOLN
INTRAMUSCULAR | Status: AC
  Filled 2023-02-11: qty 1

## 2023-02-11 MED ORDER — CEFAZOLIN SODIUM-DEXTROSE 2-4 GM/100ML-% IV SOLN
2.0000 g | INTRAVENOUS | Status: AC
  Administered 2023-02-11: 2 g via INTRAVENOUS

## 2023-02-11 MED ORDER — IOHEXOL 180 MG/ML  SOLN
INTRAMUSCULAR | Status: DC | PRN
  Administered 2023-02-11: 10 mL

## 2023-02-11 MED ORDER — ONDANSETRON HCL 4 MG/2ML IJ SOLN
INTRAMUSCULAR | Status: AC
  Filled 2023-02-11: qty 2

## 2023-02-11 MED ORDER — OXYBUTYNIN CHLORIDE 5 MG PO TABS: 5.0000 mg | ORAL_TABLET | Freq: Three times a day (TID) | ORAL | 0 refills | Status: DC | PRN

## 2023-02-11 MED ORDER — ACETAMINOPHEN 10 MG/ML IV SOLN
INTRAVENOUS | Status: DC | PRN
  Administered 2023-02-11: 1000 mg via INTRAVENOUS

## 2023-02-11 MED ORDER — FENTANYL CITRATE (PF) 100 MCG/2ML IJ SOLN
INTRAMUSCULAR | Status: AC
  Filled 2023-02-11: qty 2

## 2023-02-11 MED ORDER — ACETAMINOPHEN 10 MG/ML IV SOLN
INTRAVENOUS | Status: AC
Start: 2023-02-11 — End: ?
  Filled 2023-02-11: qty 100

## 2023-02-11 MED ORDER — OXYCODONE HCL 5 MG PO TABS
5.0000 mg | ORAL_TABLET | Freq: Once | ORAL | Status: AC | PRN
  Administered 2023-02-11: 5 mg via ORAL

## 2023-02-11 MED ORDER — PROPOFOL 10 MG/ML IV BOLUS
INTRAVENOUS | Status: DC | PRN
  Administered 2023-02-11: 150 mg via INTRAVENOUS

## 2023-02-11 MED ORDER — SUGAMMADEX SODIUM 200 MG/2ML IV SOLN
INTRAVENOUS | Status: DC | PRN
  Administered 2023-02-11: 200 mg via INTRAVENOUS

## 2023-02-11 MED ORDER — CEFAZOLIN SODIUM-DEXTROSE 2-4 GM/100ML-% IV SOLN
INTRAVENOUS | Status: AC
  Filled 2023-02-11: qty 100

## 2023-02-11 MED ORDER — PHENYLEPHRINE 80 MCG/ML (10ML) SYRINGE FOR IV PUSH (FOR BLOOD PRESSURE SUPPORT)
PREFILLED_SYRINGE | INTRAVENOUS | Status: DC | PRN
  Administered 2023-02-11: 160 ug via INTRAVENOUS

## 2023-02-11 MED ORDER — MIDAZOLAM HCL 2 MG/2ML IJ SOLN
INTRAMUSCULAR | Status: AC
  Filled 2023-02-11: qty 2

## 2023-02-11 MED ORDER — HYDROCODONE-ACETAMINOPHEN 5-325 MG PO TABS
1.0000 | ORAL_TABLET | Freq: Four times a day (QID) | ORAL | 0 refills | Status: DC | PRN
Start: 2023-02-11 — End: 2023-02-20

## 2023-02-11 MED ORDER — ORAL CARE MOUTH RINSE: 15.0000 mL | Freq: Once | OROMUCOSAL | Status: AC

## 2023-02-11 MED ORDER — CHLORHEXIDINE GLUCONATE 0.12 % MT SOLN
15.0000 mL | Freq: Once | OROMUCOSAL | Status: AC
  Administered 2023-02-11: 15 mL via OROMUCOSAL

## 2023-02-11 MED ORDER — LIDOCAINE HCL (CARDIAC) PF 100 MG/5ML IV SOSY
PREFILLED_SYRINGE | INTRAVENOUS | Status: DC | PRN
  Administered 2023-02-11: 60 mg via INTRAVENOUS

## 2023-02-11 MED ORDER — MIDAZOLAM HCL 2 MG/2ML IJ SOLN
INTRAMUSCULAR | Status: DC | PRN
  Administered 2023-02-11: 2 mg via INTRAVENOUS

## 2023-02-11 MED ORDER — SODIUM CHLORIDE 0.9 % IR SOLN
Status: DC | PRN
  Administered 2023-02-11: 1

## 2023-02-11 MED ORDER — FENTANYL CITRATE (PF) 100 MCG/2ML IJ SOLN
INTRAMUSCULAR | Status: DC | PRN
  Administered 2023-02-11: 50 ug via INTRAVENOUS

## 2023-02-11 MED ORDER — OXYBUTYNIN CHLORIDE 5 MG PO TABS
ORAL_TABLET | ORAL | Status: AC
Start: 2023-02-11 — End: ?
  Filled 2023-02-11: qty 1

## 2023-02-11 MED ORDER — DEXAMETHASONE SODIUM PHOSPHATE 10 MG/ML IJ SOLN
INTRAMUSCULAR | Status: DC | PRN
  Administered 2023-02-11: 10 mg via INTRAVENOUS

## 2023-02-11 MED ORDER — OXYCODONE HCL 5 MG PO TABS
ORAL_TABLET | ORAL | Status: AC
  Filled 2023-02-11: qty 1

## 2023-02-11 MED ORDER — TAMSULOSIN HCL 0.4 MG PO CAPS: 0.4000 mg | ORAL_CAPSULE | Freq: Every day | ORAL | 0 refills | Status: DC

## 2023-02-11 MED ORDER — CHLORHEXIDINE GLUCONATE 0.12 % MT SOLN
OROMUCOSAL | Status: AC
  Filled 2023-02-11: qty 15

## 2023-02-11 MED ORDER — OXYBUTYNIN CHLORIDE 5 MG PO TABS
5.0000 mg | ORAL_TABLET | Freq: Once | ORAL | Status: AC
  Administered 2023-02-11: 5 mg via ORAL

## 2023-02-11 MED ORDER — LACTATED RINGERS IV SOLN: INTRAVENOUS | Status: DC

## 2023-02-11 SURGICAL SUPPLY — 29 items
ADH LQ OCL WTPRF AMP STRL LF (MISCELLANEOUS)
ADHESIVE MASTISOL STRL (MISCELLANEOUS) IMPLANT
BAG DRAIN SIEMENS DORNER NS (MISCELLANEOUS) ×1 IMPLANT
BAG DRN NS LF (MISCELLANEOUS) ×1
BASKET ZERO TIP 1.9FR (BASKET) IMPLANT
BRUSH SCRUB EZ 4% CHG (MISCELLANEOUS) IMPLANT
BSKT STON RTRVL ZERO TP 1.9FR (BASKET)
CATH URET FLEX-TIP 2 LUMEN 10F (CATHETERS) IMPLANT
CATH URETL OPEN 5X70 (CATHETERS) ×1 IMPLANT
CNTNR URN SCR LID CUP LEK RST (MISCELLANEOUS) IMPLANT
CONT SPEC 4OZ STRL OR WHT (MISCELLANEOUS)
DRAPE UTILITY 15X26 TOWEL STRL (DRAPES) ×1 IMPLANT
DRSG TEGADERM 2-3/8X2-3/4 SM (GAUZE/BANDAGES/DRESSINGS) IMPLANT
FIBER LASER MOSES 200 DFL (Laser) IMPLANT
GLOVE BIO SURGEON STRL SZ 6.5 (GLOVE) ×1 IMPLANT
GOWN STRL REUS W/ TWL LRG LVL3 (GOWN DISPOSABLE) ×2 IMPLANT
GOWN STRL REUS W/TWL LRG LVL3 (GOWN DISPOSABLE) ×2
GUIDEWIRE GREEN .038 145CM (MISCELLANEOUS) IMPLANT
GUIDEWIRE STR DUAL SENSOR (WIRE) ×1 IMPLANT
IV NS IRRIG 3000ML ARTHROMATIC (IV SOLUTION) ×1 IMPLANT
KIT TURNOVER CYSTO (KITS) ×1 IMPLANT
PACK CYSTO AR (MISCELLANEOUS) ×1 IMPLANT
SET CYSTO W/LG BORE CLAMP LF (SET/KITS/TRAYS/PACK) ×1 IMPLANT
SHEATH NAVIGATOR HD 12/14X36 (SHEATH) IMPLANT
STENT DBL PTAIL 7X22 (STENTS) IMPLANT
STENT URET 6FRX24 CONTOUR (STENTS) IMPLANT
STENT URET 6FRX26 CONTOUR (STENTS) IMPLANT
SURGILUBE 2OZ TUBE FLIPTOP (MISCELLANEOUS) ×1 IMPLANT
WATER STERILE IRR 500ML POUR (IV SOLUTION) ×1 IMPLANT

## 2023-02-11 NOTE — Anesthesia Postprocedure Evaluation (Signed)
Anesthesia Post Note  Patient: Crystal Hammond  Procedure(s) Performed: CYSTOSCOPY/URETEROSCOPY/HOLMIUM LASER/STENT PLACEMENT (Left)  Patient location during evaluation: PACU Anesthesia Type: General Level of consciousness: awake and alert Pain management: pain level controlled Vital Signs Assessment: post-procedure vital signs reviewed and stable Respiratory status: spontaneous breathing, nonlabored ventilation, respiratory function stable and patient connected to nasal cannula oxygen Cardiovascular status: blood pressure returned to baseline and stable Postop Assessment: no apparent nausea or vomiting Anesthetic complications: no  No notable events documented.   Last Vitals:  Vitals:   02/11/23 0945 02/11/23 0957  BP: 115/70   Pulse: 67 67  Resp: (!) 25 (!) 26  Temp:    SpO2: 95% 99%    Last Pain:  Vitals:   02/11/23 0934  TempSrc: Tympanic  PainSc: 0-No pain                 Stephanie Coup

## 2023-02-11 NOTE — Anesthesia Procedure Notes (Signed)
Procedure Name: Intubation Date/Time: 02/11/2023 8:48 AM  Performed by: Stormy Fabian, CRNAPre-anesthesia Checklist: Patient identified, Patient being monitored, Timeout performed, Emergency Drugs available and Suction available Patient Re-evaluated:Patient Re-evaluated prior to induction Oxygen Delivery Method: Circle system utilized Preoxygenation: Pre-oxygenation with 100% oxygen Induction Type: IV induction Ventilation: Mask ventilation without difficulty Laryngoscope Size: Mac and 3 Grade View: Grade I Tube type: Oral Tube size: 7.0 mm Number of attempts: 1 Airway Equipment and Method: Stylet Placement Confirmation: ETT inserted through vocal cords under direct vision, positive ETCO2 and breath sounds checked- equal and bilateral Secured at: 21 cm Tube secured with: Tape Dental Injury: Teeth and Oropharynx as per pre-operative assessment

## 2023-02-11 NOTE — H&P (Signed)
02/11/23  RRR CTAB  [name] [birth year] [id number]   CC:    Chief Complaint  Patient presents with   Follow-up   Nephrolithiasis    HPI: Crystal Hammond is a 42 y.o. female who underwent ESWL with Dr. Richardo Hanks on 12/27/2022 for management of a 7 mm left UPJ/renal pelvis stone who presents today for postop follow-up.  Operative note describes smudging of the stone.   Today she reports she passed a few sandy fragments that were too fine to collect for analysis.  She suspects that shockwave was not successful.   KUB today with a stable left renal stone.   In-office UA today positive for intact blood; urine microscopy with 3-10 RBCs/HPF and >10 epithelial cells/hpf.   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)   Heart murmur      OUTGREW   History of febrile seizure     History of kidney stones     History of pneumonia     Kidney stone     Migraines     OCD (obsessive compulsive disorder)     Oral herpes     Pneumonia     PONV (postoperative nausea and vomiting)     Postpartum depression     Pre-diabetes     Psoriasis     PTSD (post-traumatic stress disorder)     Recurrent UTI     Rosacea     Sleep apnea     Strep pharyngitis 06/29/2022          Surgical History:      Past Surgical History:  Procedure Laterality Date   ABDOMINAL SURGERY   2012    Endometrial Surgery - went through c-section scar   CARPAL TUNNEL RELEASE Right 08/07/2021    Procedure: RIGHT CARPAL TUNNEL RELEASE;  Surgeon: Marlyne Beards, MD;  Location: [facility];  Service: Orthopedics;  Laterality: Right;   CARPAL TUNNEL RELEASE Left 08/30/2021    Procedure: LEFT CARPAL TUNNEL RELEASE;  Surgeon: Marlyne Beards, MD;  Location: [facility];  Service: Orthopedics;   Laterality: Left;   CESAREAN SECTION   2019   CESAREAN SECTION   2007   CESAREAN SECTION   2014   CESAREAN SECTION   2016   ENDOMETRIAL BIOPSY   2011   EXTRACORPOREAL SHOCK WAVE LITHOTRIPSY Left 12/27/2022    Procedure: EXTRACORPOREAL SHOCK WAVE LITHOTRIPSY (ESWL);  Surgeon: Sondra Come, MD;  Location: [facility];  Service: Urology;  Laterality: Left;   GASTROSCHISIS CLOSURE       LEEP        x 2   VENTRAL HERNIA REPAIR N/A 12/11/2019    Procedure: HERNIA REPAIR VENTRAL ADULT;  Surgeon: Carolan Shiver, MD;  Location: [facility];  Service: General;  Laterality: N/A;   WISDOM TOOTH EXTRACTION       XI ROBOTIC ASSISTED VENTRAL HERNIA N/A 12/11/2019    Procedure: XI ROBOTIC ASSISTED VENTRAL HERNIA Lap vs open;  Surgeon: Carolan Shiver, MD;  Location: [facility];  Service: General;  Laterality: N/A;          Home Medications:  Allergies as of 01/16/2023         Reactions    Other  Swelling    SHELLFISH    Phenazopyridine Hives, Rash    Benadryl [diphenhydramine]      Prednisone      Intolerant- skin feels hot    Zomig [zolmitriptan]      Diffuse aches.              Medication List           Accurate as of January 16, 2023  9:08 AM. If you have any questions, ask your nurse or doctor.              Advair Diskus 250-50 MCG/ACT Aepb Generic drug: fluticasone-salmeterol Inhale 1 puff into the lungs in the morning and at bedtime.    albuterol 108 (90 Base) MCG/ACT inhaler Commonly known as: VENTOLIN HFA Inhale 2 puffs into the lungs every 6 (six) hours as needed for wheezing or shortness of breath.    albuterol (2.5 MG/3ML) 0.083% nebulizer solution Commonly known as: PROVENTIL Inhale 3 mLs (2.5 mg total) by nebulization every 6 (six) hours as needed for wheezing or shortness of breath.    amphetamine-dextroamphetamine 20 MG tablet Commonly known as: Adderall Take 1 tablet (20 mg total) by mouth 2 (two) times daily.    busPIRone 5 MG tablet Commonly known  as: BUSPAR Take 2 tablets (10 mg total) by mouth 2 (two) times daily.    FLUoxetine 40 MG capsule Commonly known as: PROZAC Take 1 capsule (40 mg total) by mouth daily.    levonorgestrel 20 MCG/DAY Iud Commonly known as: MIRENA 1 each by Intrauterine route once.    sulfamethoxazole-trimethoprim 800-160 MG tablet Commonly known as: BACTRIM DS Take 1 tablet by mouth 2 (two) times daily.    tamsulosin 0.4 MG Caps capsule Commonly known as: FLOMAX Take 1 capsule (0.4 mg total) by mouth daily after supper.             Allergies:  Allergies       Allergies  Allergen Reactions   Other Swelling      SHELLFISH     Phenazopyridine Hives and Rash   Benadryl [Diphenhydramine]     Prednisone        Intolerant- skin feels hot   Zomig [Zolmitriptan]        Diffuse aches.          Family History:      Family History  Problem Relation Age of Onset   Depression Mother     Alcohol abuse Mother     Hernia Mother     Rashes / Skin problems Mother     Thyroid disease Mother     Anxiety disorder Mother     Depression Father     Alcohol abuse Father     Gout Father     Diabetes Father     Asthma Brother     Bipolar disorder Brother     Drug abuse Brother     ADD / ADHD Brother     Colon cancer Paternal Grandmother     Allergic rhinitis Son     Asthma Son     ADD / ADHD Son     Allergic rhinitis Son     ADD / ADHD Son     Breast cancer Maternal Aunt     Lung cancer Maternal Aunt     Ovarian cancer Neg Hx            Social History:   reports that she quit smoking about 11 years ago. Her smoking  use included cigarettes. She started smoking about 14 years ago. She has a 0.9 pack-year smoking history. She has been exposed to tobacco smoke. She has never used smokeless tobacco. She reports current alcohol use of about 2.0 standard drinks of alcohol per week. She reports that she does not use drugs.   Physical Exam: BP 111/69   Pulse 98   Ht 4' 11.5" (1.511 m)   Wt 177  lb (80.3 kg)   LMP 11/16/2022 (Exact Date)   BMI 35.15 kg/m   Constitutional:  Alert and oriented, no acute distress, nontoxic appearing HEENT: [place], AT Cardiovascular: No clubbing, cyanosis, or edema Respiratory: Normal respiratory effort, no increased work of breathing Skin: No rashes, bruises or suspicious lesions Neurologic: Grossly intact, no focal deficits, moving all 4 extremities Psychiatric: Normal mood and affect   Laboratory Data:      Results for orders placed or performed in visit on 01/16/23  Microscopic Examination    Urine  Result Value Ref Range    WBC, UA 0-5 0 - 5 /hpf    RBC, Urine 3-10 (A) 0 - 2 /hpf    Epithelial Cells (non renal) >10 (A) 0 - 10 /hpf    Bacteria, UA Few None seen/Few  Urinalysis, Complete  Result Value Ref Range    Specific Gravity, UA 1.015 1.005 - 1.030    pH, UA 7.0 5.0 - 7.5    Color, UA Yellow Yellow    Appearance Ur Clear Clear    Leukocytes,UA Negative Negative    Protein,UA Negative Negative/Trace    Glucose, UA Negative Negative    Ketones, UA Negative Negative    RBC, UA Trace (A) Negative    Bilirubin, UA Negative Negative    Urobilinogen, Ur 0.2 0.2 - 1.0 mg/dL    Nitrite, UA Negative Negative    Microscopic Examination See below:      Pertinent Imaging: KUB, 01/16/2023:    I personally reviewed the images referenced above and note a stable left renal pelvic/UPJ stone.   Assessment & Plan:   1. Left renal stone Stone is stable in appearance on KUB.  UA is bland aside from anticipated micro heme.   We discussed various treatment options for her retained stone including repeat ESWL versus ureteroscopy.  She is hesitant to repeat ESWL due to concerns that it would fail again, which is reasonable.   We discussed that ureteroscopy is a more invasive, outpatient surgery that is performed under general anesthesia.  We discussed that we will access her urinary tract via the urethra and use a laser to fragment the stone, then  removing the larger fragments.  Afterward, a ureteral stent will be placed to maximize drainage and promote healing postoperatively.  We discussed that the duration of her ureteral stent will be determined intraoperatively and vary between 3 and 7-10 days.  We discussed common stent symptoms including flank pain, bladder pain, dysuria, urgency, frequency, and gross hematuria.   She would like to proceed with left ureteroscopy.  Will send urine for preop culture today and get her scheduled. - Urinalysis, Complete - CULTURE, URINE COMPREHENSIVE - Ambulatory Referral For Surgery Scheduling    Return in 26 days (on 02/11/2023) for Left ureteroscopy with laser lithotripsy and stent placement with Dr. Apolinar Junes.   Carman Ching, PA-C   [facility] [address] [place], [place] [address] [phone]

## 2023-02-11 NOTE — Anesthesia Preprocedure Evaluation (Signed)
Anesthesia Evaluation  Patient identified by MRN, date of birth, ID band Patient awake    Reviewed: Allergy & Precautions, NPO status , Patient's Chart, lab work & pertinent test results  Airway Mallampati: III  TM Distance: >3 FB Neck ROM: full    Dental  (+) Chipped, Dental Advidsory Given   Pulmonary asthma , sleep apnea and Continuous Positive Airway Pressure Ventilation , former smoker   Pulmonary exam normal        Cardiovascular negative cardio ROS Normal cardiovascular exam     Neuro/Psych  PSYCHIATRIC DISORDERS Anxiety Depression    negative neurological ROS     GI/Hepatic negative GI ROS, Neg liver ROS,,,  Endo/Other  negative endocrine ROS    Renal/GU Renal disease     Musculoskeletal   Abdominal   Peds  Hematology negative hematology ROS (+)   Anesthesia Other Findings Past Medical History: No date: Anxiety No date: Cervical dysplasia No date: Complication of anesthesia     Comment:  LOW BLOOD PRESSURE No date: Depression No date: Endometriosis 06/10/2022: FH: genetic disorder 1982 (birth): Gastroschisis No date: Heart murmur     Comment:  OUTGREW No date: History of febrile seizure No date: History of kidney stones No date: History of pneumonia No date: Kidney stone No date: Left nephrolithiasis No date: Migraines No date: OCD (obsessive compulsive disorder) No date: Oral herpes No date: Pneumonia No date: PONV (postoperative nausea and vomiting) No date: Postpartum depression No date: Pre-diabetes No date: Psoriasis No date: PTSD (post-traumatic stress disorder) No date: Recurrent UTI No date: Rosacea No date: Sleep apnea 06/29/2022: Strep pharyngitis  Past Surgical History: 2012: ABDOMINAL SURGERY     Comment:  Endometrial Surgery - went through c-section scar 08/07/2021: CARPAL TUNNEL RELEASE; Right     Comment:  Procedure: RIGHT CARPAL TUNNEL RELEASE;  Surgeon:                Marlyne Beards, MD;  Location: [facility];  Service: Orthopedics;  Laterality: Right; 08/30/2021: CARPAL TUNNEL RELEASE; Left     Comment:  Procedure: LEFT CARPAL TUNNEL RELEASE;  Surgeon:               Marlyne Beards, MD;  Location: [facility];  Service: Orthopedics;  Laterality: Left; 2019: CESAREAN SECTION 2007: CESAREAN SECTION 2014: CESAREAN SECTION 2016: CESAREAN SECTION 2011: ENDOMETRIAL BIOPSY 12/27/2022: EXTRACORPOREAL SHOCK WAVE LITHOTRIPSY; Left     Comment:  Procedure: EXTRACORPOREAL SHOCK WAVE LITHOTRIPSY (ESWL);              Surgeon: Sondra Come, MD;  Location: [facility];                Service: Urology;  Laterality: Left; No date: GASTROSCHISIS CLOSURE No date: LEEP     Comment:  x 2 12/11/2019: VENTRAL HERNIA REPAIR; N/A     Comment:  Procedure: HERNIA REPAIR VENTRAL ADULT;  Surgeon:               Carolan Shiver, MD;  Location: [facility];  Service:              General;  Laterality: N/A; No date: WISDOM TOOTH EXTRACTION 12/11/2019: XI ROBOTIC ASSISTED VENTRAL HERNIA; N/A     Comment:  Procedure: XI ROBOTIC ASSISTED VENTRAL HERNIA Lap vs  open;  Surgeon: Carolan Shiver, MD;  Location:               [facility];  Service: General;  Laterality: N/A;  BMI    Body Mass Index: 34.95 kg/m      Reproductive/Obstetrics negative OB ROS                             Anesthesia Physical Anesthesia Plan  ASA: 2  Anesthesia Plan: General ETT   Post-op Pain Management:    Induction: Intravenous  PONV Risk Score and Plan: 3 and Ondansetron, Dexamethasone and Midazolam  Airway Management Planned: Oral ETT  Additional Equipment:   Intra-op Plan:   Post-operative Plan: Extubation in OR  Informed Consent: I have reviewed the patients History and Physical, chart, labs and discussed the procedure including the risks, benefits and alternatives for the  proposed anesthesia with the patient or authorized representative who has indicated his/her understanding and acceptance.     Dental Advisory Given  Plan Discussed with: Anesthesiologist, CRNA and Surgeon  Anesthesia Plan Comments: (Patient consented for risks of anesthesia including but not limited to:  - adverse reactions to medications - damage to eyes, teeth, lips or other oral mucosa - nerve damage due to positioning  - sore throat or hoarseness - Damage to heart, brain, nerves, lungs, other parts of body or loss of life  Patient voiced understanding and assent.)       Anesthesia Quick Evaluation

## 2023-02-11 NOTE — Discharge Instructions (Addendum)
You have a ureteral stent in place.  This is a tube that extends from your kidney to your bladder.  This may cause urinary bleeding, burning with urination, and urinary frequency.  Please call our office or present to the ED if you develop fevers >101 or pain which is not able to be controlled with oral pain medications.  You may be given either Flomax and/ or ditropan to help with bladder spasms and stent pain in addition to pain medications.    [facility] [address], [address] [place], [place] [address] [phone]  AMBULATORY SURGERY  DISCHARGE INSTRUCTIONS   The drugs that you were given will stay in your system until tomorrow so for the next 24 hours you should not:  Drive an automobile Make any legal decisions Drink any alcoholic beverage   You may resume regular meals tomorrow.  Today it is better to start with liquids and gradually work up to solid foods.  You may eat anything you prefer, but it is better to start with liquids, then soup and crackers, and gradually work up to solid foods.   Please notify your doctor immediately if you have any unusual bleeding, trouble breathing, redness and pain at the surgery site, drainage, fever, or pain not relieved by medication.    Additional Instructions:   Please contact your physician with any problems or Same Day Surgery at [phone], Monday through Friday 6 am to 4 pm, or [place] at  Main number at [phone].  

## 2023-02-11 NOTE — Plan of Care (Signed)
 CHL Tonsillectomy/Adenoidectomy, Postoperative PEDS care plan entered in error.

## 2023-02-11 NOTE — Op Note (Signed)
Date of procedure: 02/11/23  Preoperative diagnosis:  Left nephrolithiasis  Postoperative diagnosis:  Same as above Mild UPJ obstruction  Procedure: Left ureteroscopy laser lithotripsy Left retrograde pyelogram Left ureteral stent placement Interpretation of fluoroscopy less than 30 minutes  Surgeon: Vanna Scotland, MD  Anesthesia: General  Complications: None  Intraoperative findings: Left retrograde without filling defects within the ureter.  Renal pelvic fullness with caliber change at the UPJ consistent with probable mild UPJ obstruction versus retained stone.  Nonobstructing stone encountered within the renal pelvis pushed into an upper pole calyx and obliterated.  Some difficulties navigating fairly narrow scarred like UPJ.    EBL: Minimal  Specimens: None  Drains: 7 x 22 French double-J ureteral stent on the left  Indication: Crystal Hammond is a 42 y.o. patient with 7 mm left renal pelvic stone status post failed ESWL who presents today for definitive management.  After reviewing the management options for treatment, she elected to proceed with the above surgical procedure(s). We have discussed the potential benefits and risks of the procedure, side effects of the proposed treatment, the likelihood of the patient achieving the goals of the procedure, and any potential problems that might occur during the procedure or recuperation. Informed consent has been obtained.  Description of procedure:  The patient was taken to the operating room and general anesthesia was induced.  The patient was placed in the dorsal lithotomy position, prepped and draped in the usual sterile fashion, and preoperative antibiotics were administered. A preoperative time-out was performed.   21 [place] scope was advanced per urethra into the bladder.  On scout imaging, the stone was not immediately apparent.  Attention was turned to the left ureteral orifice.  It was cannulated using a 5 [place]  open-ended ureteral catheter.  A gentle retrograde pyelogram on the side showed no obvious filling defects within the left renal ureter.  There was a caliber change at the left UPJ with some mild dilation possibly mild UPJ obstruction or stone at this level, unclear.  A wire was in place up to the level of the kidney which went easily.  This was snapped in place as a safety wire.  The ureter was dilated using a 4.5 semirigid ureteroscope up to the level of the proximal ureter to confirm that no interval retained fragment was in the ureter.  Under direct visualization, Super Stiff wire was then placed up to the level of the kidney.  On imaging, calcification could be seen in the renal pelvis at this point in time.  I then advanced a 12/14 French semirigid ureteroscope to the level of the proximal ureter under fluoroscopic guidance which went easily over the Super Stiff wire.  I then introduced a digital flexible 8 French ureteroscope to the proximal ureter where the UPJ was noted to be quite narrow, approximately 8 [place] in diameter.  I was not able to easily navigate this with the scope.  I did have using a railroad technique over the Super Stiff wire to introduce into the renal pelvis.  I chased the stone into an upper pole calyx and then using a 200 m laser fiber, obliterated the stone using dusting settings of 0.3 J and 80 Hz.  No residual stone dust remained.  The final retrograde pyelogram showed no contrast extravasation and created a roadmap to ensure that each and every calyx was directly visualized.  I then backed the scope down the length of the ureter inspected along the way.  There was some very  minimal trauma noted at the UPJ from manipulation but it was more dilated this point in time.  The remainder of the ureter is unremarkable.  A 7 x 22 French double-J ureteral stent was then placed over the sensor wire up to the level of the kidney.  Once the wire was withdrawn, a full coil was noted within the  renal pelvis as well as within the bladder.  The bladder was then drained.  Patient was then cleaned and dried, repositioned in supine position, reversed paresthesia, and taken to the PACU in stable condition.  Plan: Will have her return next week for cystoscopy, stent removal.  Vanna Scotland, M.D.

## 2023-02-11 NOTE — Telephone Encounter (Signed)
Sent. Thanks.   

## 2023-02-11 NOTE — Telephone Encounter (Signed)
Last OV: 6/25/202 Pending OV: Nothing scheduled at this time Medication Adderall 20mg  Directions: Take one tablet BID Last Refill: 12/17/2022 Qty: #60 with 0 refills

## 2023-02-11 NOTE — Transfer of Care (Signed)
Immediate Anesthesia Transfer of Care Note  Patient: [name]  Procedure(s) Performed: CYSTOSCOPY/URETEROSCOPY/HOLMIUM LASER/STENT PLACEMENT (Left)  Patient Location: PACU  Anesthesia Type:General  Level of Consciousness: drowsy and patient cooperative  Airway & Oxygen Therapy: Patient Spontanous Breathing  Post-op Assessment: Report given to RN and Patient moving all extremities  Post vital signs: Reviewed and stable  Last Vitals:  Vitals Value Taken Time  BP 111/66 02/11/23 0934  Temp 36.6 C 02/11/23 0934  Pulse 70 02/11/23 0934  Resp 24 02/11/23 0934  SpO2 95 % 02/11/23 0934  Vitals shown include unfiled device data.  Last Pain:  Vitals:   02/11/23 0934  TempSrc: Tympanic  PainSc:          Complications: No notable events documented.

## 2023-02-15 DIAGNOSIS — Z419 Encounter for procedure for purposes other than remedying health state, unspecified: Secondary | ICD-10-CM | POA: Diagnosis not present

## 2023-02-20 ENCOUNTER — Ambulatory Visit: Payer: BC Managed Care – PPO | Admitting: Urology

## 2023-02-20 VITALS — BP 113/72 | HR 74

## 2023-02-20 DIAGNOSIS — Z466 Encounter for fitting and adjustment of urinary device: Secondary | ICD-10-CM | POA: Diagnosis not present

## 2023-02-20 DIAGNOSIS — N200 Calculus of kidney: Secondary | ICD-10-CM

## 2023-02-20 LAB — MICROSCOPIC EXAMINATION

## 2023-02-20 LAB — URINALYSIS, COMPLETE
Bilirubin, UA: NEGATIVE
Glucose, UA: NEGATIVE
Ketones, UA: NEGATIVE
Nitrite, UA: NEGATIVE
Specific Gravity, UA: 1.02 (ref 1.005–1.030)
Urobilinogen, Ur: 0.2 mg/dL (ref 0.2–1.0)
pH, UA: 6 (ref 5.0–7.5)

## 2023-02-20 MED ORDER — CEPHALEXIN 250 MG PO CAPS
500.0000 mg | ORAL_CAPSULE | Freq: Once | ORAL | Status: AC
  Administered 2023-02-20: 500 mg via ORAL

## 2023-02-20 NOTE — Progress Notes (Unsigned)
   02/20/23  CC:  Chief Complaint  Patient presents with   Cysto Stent Removal    HPI: 42 year old female who presents today for cystoscopy, stent removal.  She underwent ureteroscopy last week found to have a narrow UPJ.  She presents today for cystoscopy stent removal.  She is very anxious about the procedure today.  She has had a lot of stressors including her child having orthopedic injury, her client being diagnosed with sepsis and other issues.  Blood pressure 113/72, pulse 74. NED. A&Ox3.   No respiratory distress   Abd soft, NT, ND Normal external genitalia with patent urethral meatus  Cystoscopy/ Stent removal procedure  Patient identification was confirmed, informed consent was obtained, and patient was prepped using Betadine solution.  Lidocaine jelly was administered per urethral meatus.    Preoperative abx where received prior to procedure.    Procedure: - Flexible cystoscope introduced, without any difficulty.   - Thorough search of the bladder revealed:    normal urethral meatus  Stent seen emanating from left ureteral orifice, grasped with stent graspers, and removed in entirety.     Post-Procedure: - Patient tolerated the procedure well  Assessment/ Plan:  1. Encounter for removal of ureteral stent Stent removed today in entirety  Postprocedural warning symptoms were reviewed  Single dose of p.o. Keflex given today for prophylaxis - Urinalysis, Complete - cephALEXin (KEFLEX) capsule 500 mg - US RENAL; Future  2. Kidney stones Status post ureteroscopy  Follow-up in 6 weeks with renal ultrasound prior - US RENAL; Future  Vanna Scotland, MD

## 2023-03-01 ENCOUNTER — Other Ambulatory Visit: Payer: Self-pay | Admitting: Family Medicine

## 2023-03-01 NOTE — Telephone Encounter (Signed)
Last office visit: 01/10/23  Next office visit: NOTHING SCHEDULED Last refill:   08/10/22 FLUOXETINE HCL 40 MG CAPSULE qty: 90 capsules with 1 refill

## 2023-03-03 NOTE — Telephone Encounter (Signed)
Sent. Thanks.   

## 2023-03-07 ENCOUNTER — Encounter: Payer: Self-pay | Admitting: Family Medicine

## 2023-03-07 ENCOUNTER — Ambulatory Visit (INDEPENDENT_AMBULATORY_CARE_PROVIDER_SITE_OTHER): Payer: BC Managed Care – PPO | Admitting: Family Medicine

## 2023-03-07 VITALS — BP 118/70 | HR 85 | Temp 98.6°F | Ht 59.0 in | Wt 176.8 lb

## 2023-03-07 DIAGNOSIS — F411 Generalized anxiety disorder: Secondary | ICD-10-CM | POA: Diagnosis not present

## 2023-03-07 MED ORDER — BUSPIRONE HCL 10 MG PO TABS: 15.0000 mg | ORAL_TABLET | Freq: Two times a day (BID) | ORAL | 1 refills | Status: DC

## 2023-03-07 NOTE — Patient Instructions (Addendum)
I would try taking 15mg  buspar twice a day for 1 week then 20mg  twice a day if needed after that.  Please let me know how it goes.  Take care.  Glad to see you.

## 2023-03-07 NOTE — Progress Notes (Signed)
Hadn't recently needed advair and was doing well off med.  D/w pt.   Anxiety d/w pt.  Has flares of anxiety and then she gets more depressed from being overwhelmed.  No SI/HI.  Holiday, work, family, seasonal stressors d/w pt. Sig stressor with riding in a car as a passenger, but she does well driving.    D/w pt about prev u/s with fatty liver.  Prev LFTs wnl.    Meds, vitals, and allergies reviewed.   ROS: Per HPI unless specifically indicated in ROS section   GEN: nad, alert and oriented HEENT: mucous membranes moist NECK: supple w/o LA CV: rrr. PULM: ctab, no inc wob ABD: soft, +bs EXT: no edema SKIN: no acute rash

## 2023-03-10 NOTE — Assessment & Plan Note (Signed)
Discussed options.  Would defer weight loss considerations until anxiety is better controlled, d/w pt.  I would try taking 15mg  buspar twice a day for 1 week then 20mg  twice a day if needed after that.  Then she can update me.  Okay for outpatient f/u.

## 2023-03-11 ENCOUNTER — Other Ambulatory Visit: Payer: Self-pay | Admitting: Family Medicine

## 2023-03-11 MED ORDER — AMPHETAMINE-DEXTROAMPHETAMINE 20 MG PO TABS: 20.0000 mg | ORAL_TABLET | Freq: Two times a day (BID) | ORAL | 0 refills | Status: DC

## 2023-03-11 NOTE — Telephone Encounter (Signed)
o/v 03/07/2023 f/u na  last fill 02/11/23 #60 0rf  uds n/a contract na

## 2023-03-11 NOTE — Telephone Encounter (Signed)
Sent. Thanks.   

## 2023-03-17 DIAGNOSIS — Z419 Encounter for procedure for purposes other than remedying health state, unspecified: Secondary | ICD-10-CM | POA: Diagnosis not present

## 2023-04-04 ENCOUNTER — Ambulatory Visit: Payer: BC Managed Care – PPO

## 2023-04-05 ENCOUNTER — Other Ambulatory Visit: Payer: BC Managed Care – PPO

## 2023-04-17 DIAGNOSIS — Z419 Encounter for procedure for purposes other than remedying health state, unspecified: Secondary | ICD-10-CM | POA: Diagnosis not present

## 2023-04-19 ENCOUNTER — Ambulatory Visit
Admission: RE | Admit: 2023-04-19 | Discharge: 2023-04-19 | Disposition: A | Discharge status: Home / Self Care | Payer: 59 | Source: Ambulatory Visit | Attending: Urology | Admitting: Urology

## 2023-04-19 DIAGNOSIS — Z466 Encounter for fitting and adjustment of urinary device: Secondary | ICD-10-CM | POA: Insufficient documentation

## 2023-04-19 DIAGNOSIS — N200 Calculus of kidney: Secondary | ICD-10-CM | POA: Insufficient documentation

## 2023-04-23 ENCOUNTER — Ambulatory Visit: Payer: BC Managed Care – PPO | Admitting: Urology

## 2023-04-25 ENCOUNTER — Ambulatory Visit: Payer: BC Managed Care – PPO | Admitting: Urology

## 2023-04-26 ENCOUNTER — Encounter: Payer: Self-pay | Admitting: Urology

## 2023-05-06 ENCOUNTER — Other Ambulatory Visit: Payer: Self-pay | Admitting: Family Medicine

## 2023-05-07 ENCOUNTER — Ambulatory Visit
Admission: RE | Admit: 2023-05-07 | Discharge: 2023-05-07 | Disposition: A | Discharge status: Home / Self Care | Payer: 59 | Source: Ambulatory Visit | Attending: Family | Admitting: Family

## 2023-05-07 ENCOUNTER — Ambulatory Visit (INDEPENDENT_AMBULATORY_CARE_PROVIDER_SITE_OTHER): Payer: Medicaid Other | Admitting: Family

## 2023-05-07 ENCOUNTER — Encounter: Payer: Self-pay | Admitting: Family

## 2023-05-07 ENCOUNTER — Ambulatory Visit: Payer: Self-pay | Admitting: Family Medicine

## 2023-05-07 VITALS — BP 100/62 | HR 77 | Temp 98.2°F | Ht 59.0 in | Wt 190.6 lb

## 2023-05-07 DIAGNOSIS — M791 Myalgia, unspecified site: Secondary | ICD-10-CM | POA: Diagnosis not present

## 2023-05-07 DIAGNOSIS — R1031 Right lower quadrant pain: Secondary | ICD-10-CM | POA: Diagnosis present

## 2023-05-07 DIAGNOSIS — R109 Unspecified abdominal pain: Secondary | ICD-10-CM | POA: Diagnosis not present

## 2023-05-07 DIAGNOSIS — R1084 Generalized abdominal pain: Secondary | ICD-10-CM

## 2023-05-07 DIAGNOSIS — R1011 Right upper quadrant pain: Secondary | ICD-10-CM | POA: Diagnosis not present

## 2023-05-07 DIAGNOSIS — R059 Cough, unspecified: Secondary | ICD-10-CM | POA: Diagnosis not present

## 2023-05-07 DIAGNOSIS — R509 Fever, unspecified: Secondary | ICD-10-CM

## 2023-05-07 DIAGNOSIS — R52 Pain, unspecified: Secondary | ICD-10-CM

## 2023-05-07 LAB — CBC WITH DIFFERENTIAL/PLATELET
Basophils Absolute: 0 10*3/uL (ref 0.0–0.1)
Basophils Relative: 0.3 % (ref 0.0–3.0)
Eosinophils Absolute: 0.1 10*3/uL (ref 0.0–0.7)
Eosinophils Relative: 0.8 % (ref 0.0–5.0)
HCT: 36.2 % (ref 36.0–46.0)
Hemoglobin: 11.9 g/dL — ABNORMAL LOW (ref 12.0–15.0)
Lymphocytes Relative: 32.4 % (ref 12.0–46.0)
Lymphs Abs: 2.6 10*3/uL (ref 0.7–4.0)
MCHC: 32.8 g/dL (ref 30.0–36.0)
MCV: 85 fL (ref 78.0–100.0)
Monocytes Absolute: 0.5 10*3/uL (ref 0.1–1.0)
Monocytes Relative: 5.6 % (ref 3.0–12.0)
Neutro Abs: 4.9 10*3/uL (ref 1.4–7.7)
Neutrophils Relative %: 60.9 % (ref 43.0–77.0)
Platelets: 263 10*3/uL (ref 150.0–400.0)
RBC: 4.26 Mil/uL (ref 3.87–5.11)
RDW: 13.9 % (ref 11.5–15.5)
WBC: 8 10*3/uL (ref 4.0–10.5)

## 2023-05-07 LAB — POCT URINALYSIS DIP (CLINITEK)
Bilirubin, UA: NEGATIVE
Glucose, UA: NEGATIVE mg/dL
Ketones, POC UA: NEGATIVE mg/dL
Leukocytes, UA: NEGATIVE
Nitrite, UA: NEGATIVE
POC PROTEIN,UA: NEGATIVE
Spec Grav, UA: 1.015 (ref 1.010–1.025)
Urobilinogen, UA: NEGATIVE U/dL — AB
pH, UA: 6 (ref 5.0–8.0)

## 2023-05-07 LAB — POCT INFLUENZA A/B
Influenza A, POC: NEGATIVE
Influenza B, POC: NEGATIVE

## 2023-05-07 LAB — COMPREHENSIVE METABOLIC PANEL
ALT: 13 U/L (ref 0–35)
AST: 14 U/L (ref 0–37)
Albumin: 4.1 g/dL (ref 3.5–5.2)
Alkaline Phosphatase: 46 U/L (ref 39–117)
BUN: 7 mg/dL (ref 6–23)
CO2: 28 meq/L (ref 19–32)
Calcium: 9 mg/dL (ref 8.4–10.5)
Chloride: 103 meq/L (ref 96–112)
Creatinine, Ser: 0.63 mg/dL (ref 0.40–1.20)
GFR: 110.57 mL/min (ref >60.00)
Glucose, Bld: 88 mg/dL (ref 70–99)
Potassium: 4 meq/L (ref 3.5–5.1)
Sodium: 138 meq/L (ref 135–145)
Total Bilirubin: 0.4 mg/dL (ref 0.2–1.2)
Total Protein: 7.2 g/dL (ref 6.0–8.3)

## 2023-05-07 LAB — POC COVID19 BINAXNOW: SARS Coronavirus 2 Ag: NEGATIVE

## 2023-05-07 LAB — AMYLASE: Amylase: 28 U/L (ref 27–131)

## 2023-05-07 LAB — POCT RAPID STREP A (OFFICE): Rapid Strep A Screen: NEGATIVE

## 2023-05-07 LAB — LIPASE: Lipase: 16 U/L (ref 11.0–59.0)

## 2023-05-07 MED ORDER — IOHEXOL 9 MG/ML PO SOLN
500.0000 mL | ORAL | Status: AC
Start: 2023-05-07 — End: 2023-05-07
  Administered 2023-05-07: 500 mL via ORAL

## 2023-05-07 MED ORDER — IOHEXOL 300 MG/ML  SOLN
100.0000 mL | Freq: Once | INTRAMUSCULAR | Status: AC | PRN
  Administered 2023-05-07: 100 mL via INTRAVENOUS

## 2023-05-07 NOTE — Progress Notes (Signed)
Spoke to Dr. Apolinar Junes her urologist. She states pt is due to come back in for an office visit, she has no showed urology x 2. Last visit the left ureter was a little bit tight, but she doesn't have any hydronephrosis so for now there is just some idiopathic dilation that is likely unrelated to current symptoms from today, but with f/u she will be able to investigate further.    As far as workup from today, I do think with the ovarian cyst she should see her gynecologist, is this Dr. Alvester Morin I will pass it along to her as well but pt to schedule f/u with her.   As always have her monitor for fever, worsening nausea/vomiting, and or inability to pee/worsening abdominal pain etc. For now follow with a bland diet and water as needed. Possible GI virus

## 2023-05-07 NOTE — Progress Notes (Signed)
Established Patient Office Visit  Subjective:   Patient ID: Crystal Hammond, female    DOB: 26-Jun-1980  Age: 43 y.o. MRN: [id number]  CC:  Chief Complaint  Patient presents with  . Acute Visit    Reports cough, body aches and vomiting x2-3 days.     HPI: Crystal Hammond is a 43 y.o. female presenting on 05/07/2023 for Acute Visit (Reports cough, body aches and vomiting x2-3 days. )  About 2-3 days ago started with fatigue body aches, and yesterday started throwing up even after taking zofran. Fever three days ago up to 101 F , but hasn't had one since. Has taken nyquil and dayquil.   No sore throat, nasal congestion, but productive cough white mucous.  No diarrhea.  Feeling nauseous.  Took zofran yesterday without any relief.   Yesterday starting getting pain right flank area. Has h/o kidney stones and UTI's staying hydrated. No other urinary sx  Does have asthma felt some sob yesterday did a breathing treatment with slight relief.   Warm apple cider this am about 1 hour ago  No food this am       ROS: Negative unless specifically indicated above in HPI.   Relevant past medical history reviewed and updated as indicated.   Allergies and medications reviewed and updated.   Current Outpatient Medications:  .  albuterol (PROVENTIL) (2.5 MG/3ML) 0.083% nebulizer solution, Inhale 3 mLs (2.5 mg total) by nebulization every 6 (six) hours as needed for wheezing or shortness of breath., Disp: 150 mL, Rfl: 1 .  albuterol (VENTOLIN HFA) 108 (90 Base) MCG/ACT inhaler, Inhale 2 puffs into the lungs every 6 (six) hours as needed for wheezing or shortness of breath., Disp: 18 g, Rfl: 0 .  amphetamine-dextroamphetamine (ADDERALL) 20 MG tablet, Take 1 tablet (20 mg total) by mouth 2 (two) times daily., Disp: 60 tablet, Rfl: 0 .  fluticasone-salmeterol (ADVAIR DISKUS) 250-50 MCG/ACT AEPB, Inhale 1 puff into the lungs in the morning and at bedtime., Disp: 60 each, Rfl: 11 .   levonorgestrel (MIRENA) 20 MCG/DAY IUD, 1 each by Intrauterine route once., Disp: , Rfl:  .  tamsulosin (FLOMAX) 0.4 MG CAPS capsule, Take 1 capsule (0.4 mg total) by mouth daily after supper., Disp: 14 capsule, Rfl: 0 .  busPIRone (BUSPAR) 10 MG tablet, Take 1.5-2 tablets (15-20 mg total) by mouth 2 (two) times daily. (Patient not taking: Reported on 05/07/2023), Disp: 360 tablet, Rfl: 1 .  FLUoxetine (PROZAC) 40 MG capsule, TAKE 1 CAPSULE (40 MG TOTAL) BY MOUTH DAILY. (Patient not taking: Reported on 05/07/2023), Disp: 90 capsule, Rfl: 1  Allergies  Allergen Reactions  . Other Swelling    SHELLFISH   . Phenazopyridine Hives and Rash  . Benadryl [Diphenhydramine]   . Prednisone     Intolerant- skin feels hot  . Zomig [Zolmitriptan]     Diffuse aches.      Objective:   BP 100/62 (BP Location: Left Arm, Patient Position: Sitting, Cuff Size: Normal)   Pulse 77   Temp 98.2 F (36.8 C) (Oral)   Ht 4\' 11"  (1.499 m)   Wt 190 lb 9.6 oz (86.5 kg)   SpO2 99%   BMI 38.50 kg/m    Physical Exam Constitutional:      General: She is not in acute distress.    Appearance: Normal appearance. She is normal weight. She is not ill-appearing, toxic-appearing or diaphoretic.  HENT:     Head: Normocephalic.     Right Ear: Tympanic  membrane normal.     Left Ear: Tympanic membrane normal.     Nose: Nose normal.     Mouth/Throat:     Mouth: Mucous membranes are dry.     Pharynx: No oropharyngeal exudate or posterior oropharyngeal erythema.  Eyes:     Extraocular Movements: Extraocular movements intact.     Pupils: Pupils are equal, round, and reactive to light.  Cardiovascular:     Rate and Rhythm: Normal rate and regular rhythm.     Pulses: Normal pulses.     Heart sounds: Normal heart sounds.  Pulmonary:     Effort: Pulmonary effort is normal.     Breath sounds: Normal breath sounds.  Abdominal:     General: Bowel sounds are normal.     Palpations: Abdomen is soft.     Tenderness: There  is abdominal tenderness in the right upper quadrant and right lower quadrant. There is right CVA tenderness. Positive signs include Murphy's sign, Rovsing's sign and McBurney's sign. Negative signs include psoas sign.  Musculoskeletal:     Cervical back: Normal range of motion.  Lymphadenopathy:     Cervical: Cervical adenopathy present.     Right cervical: Superficial cervical adenopathy present.  Neurological:     General: No focal deficit present.     Mental Status: She is alert and oriented to person, place, and time. Mental status is at baseline.  Psychiatric:        Mood and Affect: Mood normal.        Behavior: Behavior normal.        Thought Content: Thought content normal.        Judgment: Judgment normal.    Assessment & Plan:  Cough, unspecified type -     POC COVID-19 BinaxNow -     POCT Influenza A/B  Body aches -     POC COVID-19 BinaxNow -     POCT Influenza A/B -     POCT rapid strep A  Right flank pain Assessment & Plan: Urine today to r/o uti  Increase oral hydration  Worry for kidney stone vs pyelonephritis however ddx variable due to other assumably unrelated symptoms and findings on physical exam as well Ordering stat cbc cmp    Orders: -     POCT URINALYSIS DIP (CLINITEK) -     Urine Culture  Fever, unspecified fever cause -     CBC with Differential/Platelet  Right upper quadrant abdominal pain -     Lipase -     Amylase -     Comprehensive metabolic panel  Right lower quadrant abdominal pain Assessment & Plan: Concerning for appendicitis vs gas vs diverticulitis Stat ct abd pelvis   Orders: -     CT ABDOMEN PELVIS W CONTRAST; Future  Generalized abdominal pain Assessment & Plan: Ct abd pelvis w contrast  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  Ddx appendicitis, kidney stone, cholecystitis  Low suspicion however assess for bowel obstruction, diverticulitis.  Very low suspicion for pancreatitis       Follow up plan: Return for f/u PCP if no improvement in symptoms.  Mort Sawyers, FNP

## 2023-05-07 NOTE — Telephone Encounter (Signed)
Copied from CRM [id number]. Topic: Clinical - Red Word Triage >> May 07, 2023  7:46 AM Gurney Maxin H wrote: [facility] that prompted transfer to Nurse Triage: Vomiting, pain on right side by kidney area, body aches, cough chills   Chief Complaint: Cough, flank pain, and vomiting Symptoms: flank pain, cough ,and vomiting Frequency: constant, getting worse Pertinent Negatives: Patient denies current fever Disposition: [] ED /[] Urgent Care (no appt availability in office) / [x] Appointment(In office/virtual)/ []  [facility]/ [] Home Care/ [] Refused Recommended Disposition /[] [facility]/ []  Follow-up with PCP Additional Notes: Patient with hx of asthma, reports "chest tightness". She gave herself a breathing treatment last night with little relief. Appt schedule for today.   Reason for Disposition  [1] MILD difficulty breathing (e.g., minimal/no SOB at rest, SOB with walking, pulse <100) AND [2] still present when not coughing    Patient has hx of asthma, reports chest tightness. Gave herself a breathing treatment last night  Answer Assessment - Initial Assessment Questions 1. ONSET: "When did the cough begin?"      3-4 days  2. SEVERITY: "How bad is the cough today?"      Getting worse   3. SPUTUM: "Describe the color of your sputum" (none, dry cough; clear, white, yellow, green)     White mucus occasionally  4. HEMOPTYSIS: "Are you coughing up any blood?" If so ask: "How much?" (flecks, streaks, tablespoons, etc.)     No blood  5. DIFFICULTY BREATHING: "Are you having difficulty breathing?" If Yes, ask: "How bad is it?" (e.g., mild, moderate, severe)    - MILD: No SOB at rest, mild SOB with walking, speaks normally in sentences, can lie down, no retractions, pulse < 100.    - MODERATE: SOB at rest, SOB with minimal exertion and prefers to sit, cannot lie down flat, speaks in phrases, mild retractions, audible wheezing, pulse 100-120.    - SEVERE: Very SOB at rest, speaks  in single words, struggling to breathe, sitting hunched forward, retractions, pulse > 120      Mild shortness, but chest feels tight  6. FEVER: "Do you have a fever?" If Yes, ask: "What is your temperature, how was it measured, and when did it start?"     Fever a few days ago; 101F  7. CARDIAC HISTORY: "Do you have any history of heart disease?" (e.g., heart attack, congestive heart failure)      No  8. LUNG HISTORY: "Do you have any history of lung disease?"  (e.g., pulmonary embolus, asthma, emphysema)     Asthma  9. PE RISK FACTORS: "Do you have a history of blood clots?" (or: recent major surgery, recent prolonged travel, bedridden)     No  10. OTHER SYMPTOMS: "Do you have any other symptoms?" (e.g., runny nose, wheezing, chest pain)       Body aches, vomiting, and RIGHT flank that started yesterday  11. PREGNANCY: "Is there any chance you are pregnant?" "When was your last menstrual period?"       No; LMP November  12. TRAVEL: "Have you traveled out of the country in the last month?" (e.g., travel history, exposures)       No exposure  Protocols used: Cough - Acute Productive-A-AH

## 2023-05-07 NOTE — Assessment & Plan Note (Signed)
Concerning for appendicitis vs gas vs diverticulitis Stat ct abd pelvis

## 2023-05-07 NOTE — Progress Notes (Signed)
Good afternoon  Dr. Alvester Morin. [facility] patient, seen in office today. Incidentally found right ovarian cyst 5.0 cm, wanted to pass along. You had completed a u/s transvag last year but right ovary was not visualized. Pt came in with generalized abd pain but was pretty tender upon palpation in office.

## 2023-05-07 NOTE — Assessment & Plan Note (Addendum)
Urine today to r/o uti  Increase oral hydration  Worry for kidney stone vs pyelonephritis however ddx variable due to other assumably unrelated symptoms and findings on physical exam as well Ordering stat cbc cmp

## 2023-05-07 NOTE — Telephone Encounter (Signed)
Has OV scheduled today.  Thanks.

## 2023-05-07 NOTE — Assessment & Plan Note (Signed)
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  Ddx appendicitis, kidney stone, cholecystitis  Low suspicion however assess for bowel obstruction, diverticulitis.  Very low suspicion for pancreatitis

## 2023-05-07 NOTE — Progress Notes (Signed)
FYI

## 2023-05-08 ENCOUNTER — Encounter: Payer: Self-pay | Admitting: *Deleted

## 2023-05-08 LAB — URINE CULTURE
MICRO NUMBER:: 15981768
Result:: NO GROWTH
SPECIMEN QUALITY:: ADEQUATE

## 2023-05-09 ENCOUNTER — Encounter: Payer: Self-pay | Admitting: Family Medicine

## 2023-05-09 ENCOUNTER — Encounter: Payer: Self-pay | Admitting: Family

## 2023-05-09 DIAGNOSIS — N83201 Unspecified ovarian cyst, right side: Secondary | ICD-10-CM | POA: Insufficient documentation

## 2023-05-10 ENCOUNTER — Ambulatory Visit: Payer: 59 | Admitting: Urology

## 2023-05-10 VITALS — BP 119/78 | HR 97 | Ht 59.0 in | Wt 189.1 lb

## 2023-05-10 DIAGNOSIS — N200 Calculus of kidney: Secondary | ICD-10-CM

## 2023-05-10 DIAGNOSIS — Z87442 Personal history of urinary calculi: Secondary | ICD-10-CM | POA: Diagnosis not present

## 2023-05-10 DIAGNOSIS — N134 Hydroureter: Secondary | ICD-10-CM

## 2023-05-11 NOTE — Progress Notes (Signed)
I,Crystal Hammond,acting as a scribe for Crystal Scotland, MD.,have documented all relevant documentation on the behalf of Crystal Scotland, MD,as directed by  Crystal Scotland, MD while in the presence of Crystal Scotland, MD.  05/10/2023 11:20 AM   Crystal Hammond 1981/02/02 [id number]  Referring provider: [name], MD [address] [place],  [place] [address]  Chief Complaint  Patient presents with   Follow-up    HPI: 43 year-old female presents today for a follow-up.    She was taken to the operating room by me on 02/11/2023 for left ureteroscopy, laser lithotripsy. 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 This was removed subsequently in the office. Follow-up renal ultrasound on 04/19/2023 showed right cortical scarring, but no hydronephrosis bilaterally.   She ended up presenting to her PCP earlier this week complaining of  of fever to 101, body aches, vomiting, and pain in her right flank. Due to this concern she was sent for CT abdomen with contrast. Radiology interpreted the report as having some mild left hydroureter, but without hydronephrosis. In comparison to the right her proximal ureter actually appears to be similar bilaterally. he also had a right-sided ovarian cyst.. Urinalysis at the time was negative, along with COVID, flu, and other labs. Her creatinine was also normal. Her urine culture result was negative as well.    She mentions having had multiple UTI's and kidney infections as a teenager.  She still has abdominal and back pain. Her right kidney hurts more than it has before. She doesn't have any issues urinating. She drinks a lot of water.   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)   Heart murmur    OUTGREW   History of febrile seizure     History of kidney stones    History of pneumonia    Kidney stone    Left nephrolithiasis    Migraines    OCD (obsessive compulsive disorder)    Oral herpes    Pneumonia    PONV (postoperative nausea and vomiting)    Postpartum depression    Pre-diabetes    Psoriasis    PTSD (post-traumatic stress disorder)    Recurrent UTI    Rosacea    Sleep apnea    Strep pharyngitis 06/29/2022    Surgical History: Past Surgical History:  Procedure Laterality Date   ABDOMINAL SURGERY  2012   Endometrial Surgery - went through c-section scar   CARPAL TUNNEL RELEASE Right 08/07/2021   Procedure: RIGHT CARPAL TUNNEL RELEASE;  Surgeon: Marlyne Beards, MD;  Location: [facility];  Service: Orthopedics;  Laterality: Right;   CARPAL TUNNEL RELEASE Left 08/30/2021   Procedure: LEFT CARPAL TUNNEL RELEASE;  Surgeon: Marlyne Beards, MD;  Location: [facility];  Service: Orthopedics;  Laterality: Left;   CESAREAN SECTION  2019   CESAREAN SECTION  2007   CESAREAN SECTION  2014   CESAREAN SECTION  2016   CYSTOSCOPY/URETEROSCOPY/HOLMIUM LASER/STENT PLACEMENT Left 02/11/2023   Procedure: CYSTOSCOPY/URETEROSCOPY/HOLMIUM LASER/STENT PLACEMENT;  Surgeon: Crystal Scotland, MD;  Location: [facility];  Service: Urology;  Laterality: Left;   ENDOMETRIAL BIOPSY  2011   EXTRACORPOREAL SHOCK WAVE LITHOTRIPSY Left 12/27/2022   Procedure: EXTRACORPOREAL SHOCK WAVE LITHOTRIPSY (ESWL);  Surgeon: Sondra Come, MD;  Location: [facility];  Service: Urology;  Laterality: Left;   GASTROSCHISIS CLOSURE     LEEP     x 2   VENTRAL HERNIA REPAIR N/A 12/11/2019   Procedure: HERNIA REPAIR VENTRAL ADULT;  Surgeon: Carolan Shiver, MD;  Location: [facility];  Service: General;  Laterality: N/A;   WISDOM TOOTH EXTRACTION     XI ROBOTIC ASSISTED VENTRAL HERNIA N/A 12/11/2019   Procedure: XI ROBOTIC ASSISTED VENTRAL HERNIA Lap vs open;  Surgeon: Carolan Shiver, MD;  Location: [facility];   Service: General;  Laterality: N/A;    Home Medications:  Allergies as of 05/10/2023       Reactions   Other Swelling   SHELLFISH   Phenazopyridine Hives, Rash   Benadryl [diphenhydramine]    Prednisone    Intolerant- skin feels hot   Zomig [zolmitriptan]    Diffuse aches.          Medication List        Accurate as of May 10, 2023 11:59 PM. If you have any questions, ask your nurse or doctor.          STOP taking these medications    busPIRone 10 MG tablet Commonly known as: BUSPAR   FLUoxetine 40 MG capsule Commonly known as: PROZAC   tamsulosin 0.4 MG Caps capsule Commonly known as: FLOMAX       TAKE these medications    Advair Diskus 250-50 MCG/ACT Aepb Generic drug: fluticasone-salmeterol Inhale 1 puff into the lungs in the morning and at bedtime.   albuterol 108 (90 Base) MCG/ACT inhaler Commonly known as: VENTOLIN HFA Inhale 2 puffs into the lungs every 6 (six) hours as needed for wheezing or shortness of breath.   albuterol (2.5 MG/3ML) 0.083% nebulizer solution Commonly known as: PROVENTIL Inhale 3 mLs (2.5 mg total) by nebulization every 6 (six) hours as needed for wheezing or shortness of breath.   amphetamine-dextroamphetamine 20 MG tablet Commonly known as: Adderall Take 1 tablet (20 mg total) by mouth 2 (two) times daily.   levonorgestrel 20 MCG/DAY Iud Commonly known as: MIRENA 1 each by Intrauterine route once.        Allergies:  Allergies  Allergen Reactions   Other Swelling    SHELLFISH    Phenazopyridine Hives and Rash   Benadryl [Diphenhydramine]    Prednisone     Intolerant- skin feels hot   Zomig [Zolmitriptan]     Diffuse aches.      Family History: Family History  Problem Relation Age of Onset   Depression Mother    Alcohol abuse Mother    Hernia Mother    Rashes / Skin problems Mother    Thyroid disease Mother    Anxiety disorder Mother    Depression Father    Alcohol abuse Father    Gout Father     Diabetes Father    Asthma Brother    Bipolar disorder Brother    Drug abuse Brother    ADD / ADHD Brother    Colon cancer Paternal Grandmother    Allergic rhinitis Son    Asthma Son    ADD / ADHD Son    Allergic rhinitis Son    ADD / ADHD Son    Breast cancer Maternal Aunt    Lung cancer Maternal Aunt    Ovarian cancer Neg Hx     Social History:  reports that she quit smoking about 12 years ago. Her smoking use included cigarettes. She  started smoking about 15 years ago. She has a 0.9 pack-year smoking history. She has been exposed to tobacco smoke. She has never used smokeless tobacco. She reports current alcohol use of about 2.0 standard drinks of alcohol per week. She reports that she does not use drugs.   Physical Exam: BP 119/78   Pulse 97   Ht 4\' 11"  (1.499 m)   Wt 189 lb 2 oz (85.8 kg)   BMI 38.20 kg/m   Constitutional:  Alert and oriented, No acute distress. HEENT: [place] AT, moist mucus membranes.  Trachea midline, no masses. Neurologic: Grossly intact, no focal deficits, moving all 4 extremities. Psychiatric: Normal mood and affect.  Laboratory Data: Lab Results  Component Value Date   WBC 8.0 05/07/2023   HGB 11.9 (L) 05/07/2023   HCT 36.2 05/07/2023   MCV 85.0 05/07/2023   PLT 263.0 05/07/2023    Lab Results  Component Value Date   CREATININE 0.63 05/07/2023   Urinalysis    Component Value Date/Time   COLORURINE YELLOW 12/21/2022 1610   APPEARANCEUR Hazy (A) 02/20/2023 1107   LABSPEC 1.025 12/21/2022 1610   PHURINE 6.0 12/21/2022 1610   GLUCOSEU Negative 02/20/2023 1107   HGBUR MODERATE (A) 12/21/2022 1610   BILIRUBINUR negative 05/07/2023 0903   BILIRUBINUR Negative 02/20/2023 1107   KETONESUR negative 05/07/2023 0903   KETONESUR NEGATIVE 12/21/2022 1610   PROTEINUR 2+ (A) 02/20/2023 1107   PROTEINUR NEGATIVE 12/21/2022 1610   UROBILINOGEN negative (A) 05/07/2023 0903   NITRITE Negative 05/07/2023 0903   NITRITE Negative 02/20/2023 1107    NITRITE NEGATIVE 12/21/2022 1610   LEUKOCYTESUR Negative 05/07/2023 0903   LEUKOCYTESUR 2+ (A) 02/20/2023 1107   LEUKOCYTESUR NEGATIVE 12/21/2022 1610    Lab Results  Component Value Date   LABMICR See below: 02/20/2023   WBCUA 11-30 (A) 02/20/2023   LABEPIT 0-10 02/20/2023   MUCUS Present (A) 02/20/2023   BACTERIA Moderate (A) 02/20/2023    Pertinent Imaging:  Results for orders placed during the hospital encounter of 04/19/23  US RENAL  Narrative : PROCEDURE: US RENAL  HISTORY: Patient is a 43 y/o F with s/p ureteral stent removal, kidney stone.  COMPARISON: U/S renal 02/04/2023, XR abdomen 01/16/2023, CT renal stone protocol 12/14/2022.  TECHNIQUE: Two-dimensional grayscale and color Doppler ultrasound of the kidneys was performed.  FINDINGS: The urinary bladder demonstrates normal anechoic echogenicity. The bilateral ureteral jets are visualized.  The right kidney measures 9.6 cm. Renal cortical echotexture is normal. Cortical scarring is visualized anterior upper to mid cortex. There is no hydronephrosis. There are no stones. There are no cysts.  The left kidney measures 12.5 cm. Renal cortical echotexture is normal. There is no hydronephrosis. There are no stones. There are no cysts.  IMPRESSION: 1.  Right renal cortical scarring.  2.  No sonographic evidence of renal calculi.  Thank you for allowing [place] to assist in the care of this patient.  Electronically Signed By: Lestine Box M.D. On: 04/22/2023 08:00 Narrative & Impression  CLINICAL DATA:  Flank pain. Stone suspected. RIGHT lower quadrant pain   EXAM: CT ABDOMEN AND PELVIS WITH CONTRAST   TECHNIQUE: Multidetector CT imaging of the abdomen and pelvis was performed using the standard protocol following bolus administration of intravenous contrast.   RADIATION DOSE REDUCTION: This exam was performed according to the departmental dose-optimization program which includes automated exposure  control, adjustment of the mA and/or kV according to patient size and/or use of iterative reconstruction technique.   CONTRAST:   OMNIPAQUE IOHEXOL 300 MG/ML  SOLN   COMPARISON:  None Available.   FINDINGS: Lower chest: Lung bases are clear.   Hepatobiliary: No focal hepatic lesion. Normal gallbladder. No biliary duct dilatation. Common bile duct is normal.   Pancreas: Pancreas is normal. No ductal dilatation. No pancreatic inflammation.   Spleen: Normal spleen   Adrenals/urinary tract: Adrenal glands normal. Renal cortical scarring on the RIGHT. LEFT kidney normal. No obstructive uropathy. Mild hydroureter on LEFT obstructing normal bladder.   Stomach/Bowel: Stomach, small bowel, appendix, and cecum are normal. The colon and rectosigmoid colon are normal.   Vascular/Lymphatic: Abdominal aorta is normal caliber. No periportal or retroperitoneal adenopathy. No pelvic adenopathy.   Reproductive: Uterus is normal. IUD in the lower uterine segment. Ovoid cyst of the RIGHT ovary measures 5.0 x 4.0 cm and has uniform intermediate density with HU equal 27. No cysts present on CT 12/14/2022 suggesting a functional ovarian cysts. LEFT ovary is smaller measuring 2.0 1.9 cm (image 56   Other: No free fluid.   Musculoskeletal: No aggressive osseous lesion.   IMPRESSION: 1. Normal appendix. 2. No obstructive uropathy. 3. Mild hydroureter on the LEFT.  No obstructing lesion. 4. RIGHT ovarian cyst measuring 5.0 cm. Findings most consistent with a functional ovarian cyst. No follow-up recommended. This recommendation does not apply to premenarchal patients and to those with increased risk (genetic, family history, elevated tumor markers or other high-risk factors) of ovarian cancer. Reference: JACR 2020 Feb; 17(2):248-254 5. IUD in the lower uterine segment.   Electronically Signed   By: Genevive Bi M.D.   On: 05/07/2023 13:26  Personally reviewed the above scans and  agree with radiologic interpretation.  Assessment & Plan:    1. History of kidney stones  - She is currently stone free. No additional stones identified on CT scan.   - Encouraged increased hydration. Gave stone prevention book. We discussed general stone prevention techniques including drinking plenty water with goal of producing 2.5 L urine daily, increased citric acid intake, avoidance of high oxalate containing foods, and decreased salt intake.    2. Chronic right cortical scarring  - Likely from history of infections.  3. Bilateral left greater than right hydroureter  - She may have some residual ureteral edema from her procedure versus being a chronic finding. That being said, it does seem to be bilateral and her bladder was fairly distended at this time in the study. Wonder if it's artifactual from this.   - In the absence of frank hydronephrosis, no further stones, and normal creatinine, preference is to follow her clinically. Plan for a follow up with her renal ultrasound in six months to ensure that there's no worsening of this incidental finding. Will call her with the results.   - No concern for infection and unrelated to recent symptoms earlier this week.  Return in about 6 months (around 11/07/2023), or if symptoms worsen or fail to improve, for renal ultrasound.  [facility] [address] [place], [place] [address] [phone]

## 2023-05-18 DIAGNOSIS — Z419 Encounter for procedure for purposes other than remedying health state, unspecified: Secondary | ICD-10-CM | POA: Diagnosis not present

## 2023-05-23 ENCOUNTER — Encounter: Payer: Self-pay | Admitting: Obstetrics & Gynecology

## 2023-05-23 ENCOUNTER — Ambulatory Visit: Payer: 59 | Admitting: Obstetrics & Gynecology

## 2023-05-23 VITALS — BP 113/73 | HR 82 | Wt 188.0 lb

## 2023-05-23 DIAGNOSIS — N83201 Unspecified ovarian cyst, right side: Secondary | ICD-10-CM | POA: Diagnosis not present

## 2023-05-23 NOTE — Progress Notes (Signed)
 GYNECOLOGY OFFICE VISIT NOTE  History:   Crystal Hammond is a 43 y.o. [id number] here today for follow up after incidentally noted right ovarian cyst on recent CT scan, after evaluation for RLQ pain on 05/07/23.  Still reports some pain but decreased in severity, and just there.  Long history of endometriosis, abnormal uterine bleeding, C/S x 4.  She is considering hysterectomy. She denies any current abnormal vaginal discharge, bleeding, significant pelvic pain or other concerns.    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)   Heart murmur    OUTGREW   History of febrile seizure    History of kidney stones    History of pneumonia    Kidney stone    Left nephrolithiasis    Migraines    OCD (obsessive compulsive disorder)    Oral herpes    Pneumonia    PONV (postoperative nausea and vomiting)    Postpartum depression    Pre-diabetes    Psoriasis    PTSD (post-traumatic stress disorder)    Recurrent UTI    Rosacea    Sleep apnea    Strep pharyngitis 06/29/2022    Past Surgical History:  Procedure Laterality Date   ABDOMINAL SURGERY  2012   Endometrial Surgery - went through c-section scar   CARPAL TUNNEL RELEASE Right 08/07/2021   Procedure: RIGHT CARPAL TUNNEL RELEASE;  Surgeon: Romona Harari, MD;  Location: [facility];  Service: Orthopedics;  Laterality: Right;   CARPAL TUNNEL RELEASE Left 08/30/2021   Procedure: LEFT CARPAL TUNNEL RELEASE;  Surgeon: Romona Harari, MD;  Location: [facility];  Service: Orthopedics;  Laterality: Left;   CESAREAN SECTION  2019   CESAREAN SECTION  2007   CESAREAN SECTION  2014   CESAREAN SECTION  2016   CYSTOSCOPY/URETEROSCOPY/HOLMIUM LASER/STENT PLACEMENT Left 02/11/2023   Procedure: CYSTOSCOPY/URETEROSCOPY/HOLMIUM LASER/STENT  PLACEMENT;  Surgeon: Penne Knee, MD;  Location: [facility];  Service: Urology;  Laterality: Left;   ENDOMETRIAL BIOPSY  2011   EXTRACORPOREAL SHOCK WAVE LITHOTRIPSY Left 12/27/2022   Procedure: EXTRACORPOREAL SHOCK WAVE LITHOTRIPSY (ESWL);  Surgeon: Francisca Redell BROCKS, MD;  Location: [facility];  Service: Urology;  Laterality: Left;   GASTROSCHISIS CLOSURE     LEEP     x 2   VENTRAL HERNIA REPAIR N/A 12/11/2019   Procedure: HERNIA REPAIR VENTRAL ADULT;  Surgeon: Rodolph Romano, MD;  Location: [facility];  Service: General;  Laterality: N/A;   WISDOM TOOTH EXTRACTION     XI ROBOTIC ASSISTED VENTRAL HERNIA N/A 12/11/2019   Procedure: XI ROBOTIC ASSISTED VENTRAL HERNIA Lap vs open;  Surgeon: Rodolph Romano, MD;  Location: [facility];  Service: General;  Laterality: N/A;    The following portions of the patient's history were reviewed and updated as appropriate: allergies, current medications, past family history, past medical history, past social history, past surgical history and problem list.   Health Maintenance:  ASCUS pap and negative HRHPV on 08/17/2022.  Normal mammogram on 02/04/2023.   Review of Systems:  Pertinent items noted in HPI and remainder of comprehensive ROS otherwise negative.  Physical Exam:  BP 113/73   Pulse 82   Wt 188 lb (85.3 kg)   BMI 37.97 kg/m  CONSTITUTIONAL: Well-developed, well-nourished female in no acute distress.  HEENT:  Normocephalic, atraumatic. External right and left ear normal. No scleral icterus.  NECK: Normal range of motion, supple, no masses noted on observation SKIN: No rash noted. Not diaphoretic. No erythema. No pallor. MUSCULOSKELETAL: Normal range of motion. No edema noted. NEUROLOGIC: Alert and oriented to person, place, and time. Normal muscle tone coordination. No cranial nerve deficit noted. PSYCHIATRIC: Normal mood and affect. Normal behavior. Normal judgment and thought content. CARDIOVASCULAR: Normal heart rate  noted RESPIRATORY: Effort and breath sounds normal, no problems with respiration noted ABDOMEN: Minimal pain to palpation in RLQ. No masses noted. No other overt distention noted.   PELVIC: Deferred  Labs and Imaging CT ABDOMEN PELVIS W CONTRAST Result Date: 05/07/2023 CLINICAL DATA:  Flank pain. Stone suspected. RIGHT lower quadrant pain EXAM: CT ABDOMEN AND PELVIS WITH CONTRAST TECHNIQUE: Multidetector CT imaging of the abdomen and pelvis was performed using the standard protocol following bolus administration of intravenous contrast. RADIATION DOSE REDUCTION: This exam was performed according to the departmental dose-optimization program which includes automated exposure control, adjustment of the mA and/or kV according to patient size and/or use of iterative reconstruction technique. CONTRAST:  OMNIPAQUE  IOHEXOL  300 MG/ML  SOLN COMPARISON:  None Available. FINDINGS: Lower chest: Lung bases are clear. Hepatobiliary: No focal hepatic lesion. Normal gallbladder. No biliary duct dilatation. Common bile duct is normal. Pancreas: Pancreas is normal. No ductal dilatation. No pancreatic inflammation. Spleen: Normal spleen Adrenals/urinary tract: Adrenal glands normal. Renal cortical scarring on the RIGHT. LEFT kidney normal. No obstructive uropathy. Mild hydroureter on LEFT obstructing normal bladder. Stomach/Bowel: Stomach, small bowel, appendix, and cecum are normal. The colon and rectosigmoid colon are normal. Vascular/Lymphatic: Abdominal aorta is normal caliber. No periportal or retroperitoneal adenopathy. No pelvic adenopathy. Reproductive: Uterus is normal. IUD in the lower uterine segment. Ovoid cyst of the RIGHT ovary measures 5.0 x 4.0 cm and has uniform intermediate density with HU equal 27. No cysts present on CT 12/14/2022 suggesting a functional ovarian cysts. LEFT ovary is smaller measuring 2.0 1.9 cm (image 56 Other: No free fluid. Musculoskeletal: No aggressive osseous lesion. IMPRESSION:  1. Normal appendix. 2. No obstructive uropathy. 3. Mild hydroureter on the LEFT.  No obstructing lesion. 4. RIGHT ovarian cyst measuring 5.0 cm. Findings most consistent with a functional ovarian cyst. No follow-up recommended. This recommendation does not apply to premenarchal patients and to those with increased risk (genetic, family history, elevated tumor markers or other high-risk factors) of ovarian cancer. Reference: JACR 2020 Feb; 17(2):248-254 5. IUD in the lower uterine segment. Electronically Signed   By: Jackquline Boxer M.D.   On: 05/07/2023 13:26      Assessment and Plan:     1. Ovarian cyst, right (Primary) Patient reassured about benign nature of cyst, also reassured by reduced pain on examination. No intervention or follow up needed for this cyst.    Given her complex endometriosis and surgical history, recommended consideration of going to our minimally invasive surgeon or similar specialist at one of the tertiary care institutions for her surgical consultation.  She will consider this recommendation.   Routine preventative health maintenance measures emphasized. Please refer to After Visit Summary for other counseling recommendations.   Return for any gynecologic concerns.    I spent 25 minutes dedicated to the care of this patient including pre-visit review of records, face to face time with the patient discussing her conditions and treatments, post visit ordering of medications and appropriate tests or procedures, coordinating care and documenting this visit encounter.  GLORIS HUGGER, MD, FACOG Obstetrician & Gynecologist, [facility] for [employer], [facility]

## 2023-06-10 ENCOUNTER — Encounter: Payer: Self-pay | Admitting: Family Medicine

## 2023-06-10 ENCOUNTER — Ambulatory Visit (INDEPENDENT_AMBULATORY_CARE_PROVIDER_SITE_OTHER): Payer: 59 | Admitting: Family Medicine

## 2023-06-10 VITALS — BP 122/62 | HR 83 | Temp 98.0°F | Ht 59.0 in | Wt 187.8 lb

## 2023-06-10 DIAGNOSIS — R5383 Other fatigue: Secondary | ICD-10-CM

## 2023-06-10 DIAGNOSIS — Z0000 Encounter for general adult medical examination without abnormal findings: Secondary | ICD-10-CM

## 2023-06-10 DIAGNOSIS — Z23 Encounter for immunization: Secondary | ICD-10-CM

## 2023-06-10 DIAGNOSIS — F909 Attention-deficit hyperactivity disorder, unspecified type: Secondary | ICD-10-CM

## 2023-06-10 DIAGNOSIS — J45909 Unspecified asthma, uncomplicated: Secondary | ICD-10-CM

## 2023-06-10 DIAGNOSIS — Z1159 Encounter for screening for other viral diseases: Secondary | ICD-10-CM

## 2023-06-10 DIAGNOSIS — Z7189 Other specified counseling: Secondary | ICD-10-CM

## 2023-06-10 MED ORDER — AMPHETAMINE-DEXTROAMPHETAMINE 20 MG PO TABS: 20.0000 mg | ORAL_TABLET | Freq: Two times a day (BID) | ORAL | 0 refills | Status: DC

## 2023-06-10 NOTE — Progress Notes (Unsigned)
 CPE- See plan.  Routine anticipatory guidance given to patient.  See health maintenance.  The possibility exists that previously documented standard health maintenance information may have been brought forward from a previous encounter into this note.  If needed, that same information has been updated to reflect the current situation based on today's encounter.    Tetanus 2019 PNA d/w pt Shingles not due Prev had covid vaccine.  Flu 2025 Pap 2024- f/u due 2027.  Mammogram. 2024 DXA not due.  Living will d/w pt.  Mother designated if patient were incapacitated.    Still has IUD. Abd not sore now.  D/w pt about prev abd pain and eval with CT and urology eval.  She had uro f/u planned for later this year.   Off advair.  Rare use of SABA.  Used with weather changes.    Add been off Adderall- it became clear that adderall helped.  Off buspar and prozac for the last few months.  Emotions were flat on buspar and prozac.  Her father is hospitalized, condolences offered.  He had flu/UTI/sepsis per patient report.  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  Uncle has cancer, multiple aunts have been sick.    Fatigued.  Still using cpap at baseline.    She is safe at home.    PMH and SH reviewed  Meds, vitals, and allergies reviewed.   ROS: Per HPI.  Unless specifically indicated otherwise in HPI, the patient denies:  General: fever. Eyes: acute vision changes ENT: sore throat Cardiovascular: chest pain Respiratory: SOB GI: vomiting GU: dysuria Musculoskeletal: acute back pain Derm: acute rash Neuro: acute motor dysfunction Psych: worsening mood Endocrine: polydipsia Heme: bleeding Allergy: hayfever  GEN: nad, alert and oriented HEENT: mucous membranes moist NECK: supple w/o LA CV: rrr. PULM: ctab, no inc wob ABD: soft, +bs EXT: no edema SKIN: no acute rash

## 2023-06-10 NOTE — Patient Instructions (Addendum)
 Fasting labs when possible.  Please schedule that when possible.  Take care.  Glad to see you. Restart adderall and let me know if that isn't helping.

## 2023-06-12 NOTE — Assessment & Plan Note (Addendum)
 Tetanus 2019 PNA d/w pt Shingles not due Prev had covid vaccine.  Flu 2025 Pap 2024- f/u due 2027.  Mammogram. 2024 DXA not due.  Living will d/w pt.  Mother designated if patient were incapacitated.  Fasting labs when possible.

## 2023-06-12 NOTE — Assessment & Plan Note (Signed)
Living will d/w pt. Mother designated if patient were incapacitated.  

## 2023-06-12 NOTE — Assessment & Plan Note (Signed)
 Discussed options.  Stressors d/w pt.  Would restart adderall and let me know if that isn't helping. We agreed to make one change at a time. Return for fasting labs re: fatigue.

## 2023-06-12 NOTE — Assessment & Plan Note (Signed)
 Doing well off advair. Rare use of SABA.  Used with weather changes.  Continue as is.

## 2023-06-14 ENCOUNTER — Other Ambulatory Visit (INDEPENDENT_AMBULATORY_CARE_PROVIDER_SITE_OTHER): Payer: 59

## 2023-06-14 ENCOUNTER — Encounter: Payer: Self-pay | Admitting: Family Medicine

## 2023-06-14 ENCOUNTER — Ambulatory Visit: Payer: 59 | Admitting: Family Medicine

## 2023-06-14 VITALS — BP 130/74 | HR 92 | Temp 98.1°F | Ht 59.0 in

## 2023-06-14 DIAGNOSIS — R5383 Other fatigue: Secondary | ICD-10-CM

## 2023-06-14 DIAGNOSIS — J069 Acute upper respiratory infection, unspecified: Secondary | ICD-10-CM | POA: Insufficient documentation

## 2023-06-14 DIAGNOSIS — Z1159 Encounter for screening for other viral diseases: Secondary | ICD-10-CM

## 2023-06-14 LAB — COMPREHENSIVE METABOLIC PANEL
ALT: 19 U/L (ref 0–35)
AST: 17 U/L (ref 0–37)
Albumin: 4.1 g/dL (ref 3.5–5.2)
Alkaline Phosphatase: 53 U/L (ref 39–117)
BUN: 8 mg/dL (ref 6–23)
CO2: 29 meq/L (ref 19–32)
Calcium: 9 mg/dL (ref 8.4–10.5)
Chloride: 103 meq/L (ref 96–112)
Creatinine, Ser: 0.72 mg/dL (ref 0.40–1.20)
GFR: 104.14 mL/min (ref >60.00)
Glucose, Bld: 106 mg/dL — ABNORMAL HIGH (ref 70–99)
Potassium: 4.2 meq/L (ref 3.5–5.1)
Sodium: 139 meq/L (ref 135–145)
Total Bilirubin: 0.5 mg/dL (ref 0.2–1.2)
Total Protein: 7.2 g/dL (ref 6.0–8.3)

## 2023-06-14 LAB — CBC WITH DIFFERENTIAL/PLATELET
Basophils Absolute: 0 10*3/uL (ref 0.0–0.1)
Basophils Relative: 0.2 % (ref 0.0–3.0)
Eosinophils Absolute: 0.1 10*3/uL (ref 0.0–0.7)
Eosinophils Relative: 1.3 % (ref 0.0–5.0)
HCT: 36.5 % (ref 36.0–46.0)
Hemoglobin: 12.2 g/dL (ref 12.0–15.0)
Lymphocytes Relative: 29.8 % (ref 12.0–46.0)
Lymphs Abs: 2.3 10*3/uL (ref 0.7–4.0)
MCHC: 33.3 g/dL (ref 30.0–36.0)
MCV: 84.4 fL (ref 78.0–100.0)
Monocytes Absolute: 0.6 10*3/uL (ref 0.1–1.0)
Monocytes Relative: 7.1 % (ref 3.0–12.0)
Neutro Abs: 4.8 10*3/uL (ref 1.4–7.7)
Neutrophils Relative %: 61.6 % (ref 43.0–77.0)
Platelets: 298 10*3/uL (ref 150.0–400.0)
RBC: 4.33 Mil/uL (ref 3.87–5.11)
RDW: 13.8 % (ref 11.5–15.5)
WBC: 7.8 10*3/uL (ref 4.0–10.5)

## 2023-06-14 LAB — VITAMIN B12: Vitamin B-12: 969 pg/mL — ABNORMAL HIGH (ref 211–911)

## 2023-06-14 LAB — TSH: TSH: 2.02 u[IU]/mL (ref 0.35–5.50)

## 2023-06-14 MED ORDER — PROMETHAZINE-DM 6.25-15 MG/5ML PO SYRP: 5.0000 mL | ORAL_SOLUTION | Freq: Three times a day (TID) | ORAL | 0 refills | Status: DC | PRN

## 2023-06-14 MED ORDER — PREDNISONE 20 MG PO TABS: 20.0000 mg | ORAL_TABLET | Freq: Every day | ORAL | 0 refills | Status: DC

## 2023-06-14 NOTE — Patient Instructions (Addendum)
 Drink fluids and rest  Try the promethazine dm for cough / also will help runny nose  Prednisone for chest tightness and congestion   Nasal saline for congestion as needed  Tylenol for fever or pain or headache  Please alert [place] if symptoms worsen (if severe or short of breath please go to the ER)    Update if not starting to improve in a week or if worsening

## 2023-06-14 NOTE — Assessment & Plan Note (Signed)
 In pt with history of reactive airways who was exp to flu B (father)  Coughing/some wheeze and tightness/ temp has been low grade at home  Reassuring exam Possibly mild flu   Disc symptomatic care - see instructions on AVS Prometh dm sent / caution of sedation (per pt tessalon does not work) Low dose prednisone for wheeze 20 mg daily 5 d (does not tolerate well) Continue albuterol mdi prn   Update if not starting to improve in a week or if worsening  Call back and Er precautions noted in detail today

## 2023-06-14 NOTE — Progress Notes (Signed)
 Subjective:    Patient ID: Crystal Hammond, female    DOB: [birth year], 43 y.o.   MRN: [id number]  HPI  Wt Readings from Last 3 Encounters:  06/10/23 187 lb 12.8 oz (85.2 kg)  05/23/23 188 lb (85.3 kg)  05/10/23 189 lb 2 oz (85.8 kg)   37.93 kg/m  Vitals:   06/14/23 1059  BP: 130/74  Pulse: 92  Temp: 98.1 F (36.7 C)  SpO2: 99%   43 yo pt of Dr Para March , has a history of asthmatic bronchitis in past (albuterol)  Pt presents with uri symptoms  Declines covid testing   Had her flu shot Monday   Started feeling bad Monday evening Lost voice  Low grade temp - up to 99.9   Runny nose / clear mucous / some congestion   Cough - dry  Chest feels tight  Not a lot of wheezing   Throat is scratchy Ears are ok   No n/v/d    Has been in hospital visiting her dad with the flu   Got dizzy when she bent forward    Patient Active Problem List   Diagnosis Date Noted   Viral URI with cough 06/14/2023   Ovarian cyst, right 05/09/2023   Right flank pain 05/07/2023   Generalized abdominal pain 05/07/2023   Left arm swelling 01/10/2023   Kidney stone 12/14/2022   Microscopic hematuria 11/08/2022   Abnormal urinalysis 11/08/2022   IUD (intrauterine device) in place 11/05/2022   Loss of height 10/10/2022   Back pain 09/12/2022   Endometriosis 08/24/2022   History of gestational diabetes 08/24/2022   FH: genetic disorder 06/10/2022   Asthmatic bronchitis 01/30/2022   Bilateral carpal tunnel syndrome 07/31/2021   Advance care planning 07/09/2021   OSA (obstructive sleep apnea) 07/09/2021   Routine general medical examination at a health care facility 01/15/2021   Vitamin D deficiency 01/26/2020   Incisional hernia 12/11/2019   Prediabetes 07/26/2019   GAD (generalized anxiety disorder) 06/25/2019   Adult ADHD 06/25/2019   Depressive disorder 06/25/2019   Family history of congenital anomalies 05/27/2017   Herpes 05/27/2017   Hx of cesarean section 05/27/2017    Migraine with aura 05/27/2017   Agoraphobia 05/30/2016   History of abnormal cervical Pap smear 01/31/2016   History of kidney stones 01/31/2016   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)   Heart murmur    OUTGREW   History of febrile seizure    History of kidney stones    History of pneumonia    Kidney stone    Left nephrolithiasis    Migraines    OCD (obsessive compulsive disorder)    Oral herpes    Pneumonia    PONV (postoperative nausea and vomiting)    Postpartum depression    Pre-diabetes    Psoriasis    PTSD (post-traumatic stress disorder)    Recurrent UTI    Rosacea    Sleep apnea    Strep pharyngitis 06/29/2022   Past Surgical History:  Procedure Laterality Date   ABDOMINAL SURGERY  2012   Endometrial Surgery - went through c-section scar   CARPAL TUNNEL RELEASE Right 08/07/2021   Procedure: RIGHT CARPAL TUNNEL RELEASE;  Surgeon: Marlyne Beards, MD;  Location: [facility];  Service:  Orthopedics;  Laterality: Right;   CARPAL TUNNEL RELEASE Left 08/30/2021   Procedure: LEFT CARPAL TUNNEL RELEASE;  Surgeon: Marlyne Beards, MD;  Location: [facility];  Service: Orthopedics;  Laterality: Left;   CESAREAN SECTION  2019   CESAREAN SECTION  2007   CESAREAN SECTION  2014   CESAREAN SECTION  2016   CYSTOSCOPY/URETEROSCOPY/HOLMIUM LASER/STENT PLACEMENT Left 02/11/2023   Procedure: CYSTOSCOPY/URETEROSCOPY/HOLMIUM LASER/STENT PLACEMENT;  Surgeon: Vanna Scotland, MD;  Location: [facility];  Service: Urology;  Laterality: Left;   ENDOMETRIAL BIOPSY  2011   EXTRACORPOREAL SHOCK WAVE LITHOTRIPSY Left 12/27/2022   Procedure: EXTRACORPOREAL SHOCK WAVE LITHOTRIPSY (ESWL);  Surgeon: Sondra Come, MD;  Location: [facility];  Service: Urology;  Laterality: Left;    GASTROSCHISIS CLOSURE     LEEP     x 2   VENTRAL HERNIA REPAIR N/A 12/11/2019   Procedure: HERNIA REPAIR VENTRAL ADULT;  Surgeon: Carolan Shiver, MD;  Location: [facility];  Service: General;  Laterality: N/A;   WISDOM TOOTH EXTRACTION     XI ROBOTIC ASSISTED VENTRAL HERNIA N/A 12/11/2019   Procedure: XI ROBOTIC ASSISTED VENTRAL HERNIA Lap vs open;  Surgeon: Carolan Shiver, MD;  Location: [facility];  Service: General;  Laterality: N/A;   Social History   Tobacco Use   Smoking status: Former    Current packs/day: 0.00    Average packs/day: 0.3 packs/day for 3.0 years (0.9 ttl pk-yrs)    Types: Cigarettes    Start date: 2010    Quit date: 2013    Years since quitting: 12.1    Passive exposure: Past   Smokeless tobacco: Never  Vaping Use   Vaping status: Never Used  Substance Use Topics   Alcohol use: Not Currently    Alcohol/week: 2.0 standard drinks of alcohol    Types: 2 Cans of beer per week   Drug use: Never   Family History  Problem Relation Age of Onset   Depression Mother    Alcohol abuse Mother    Hernia Mother    Rashes / Skin problems Mother    Thyroid disease Mother    Anxiety disorder Mother    Depression Father    Alcohol abuse Father    Gout Father    Diabetes Father    Asthma Brother    Bipolar disorder Brother    Drug abuse Brother    ADD / ADHD Brother    Colon cancer Paternal Grandmother    Allergic rhinitis Son    Asthma Son    ADD / ADHD Son    Allergic rhinitis Son    ADD / ADHD Son    Breast cancer Maternal Aunt    Lung cancer Maternal Aunt    Ovarian cancer Neg Hx    Allergies  Allergen Reactions   Other Swelling    SHELLFISH    Phenazopyridine Hives and Rash   Benadryl [Diphenhydramine]    Prednisone     Intolerant- skin feels hot   Zomig [Zolmitriptan]     Diffuse aches.     Current Outpatient Medications on File Prior to Visit  Medication Sig Dispense Refill   albuterol (PROVENTIL) (2.5 MG/3ML) 0.083% nebulizer  solution Inhale 3 mLs (2.5 mg total) by nebulization every 6 (six) hours as needed for wheezing or shortness of breath. 150 mL 1   albuterol (VENTOLIN HFA) 108 (90 Base) MCG/ACT inhaler Inhale 2 puffs into the lungs every 6 (six) hours as needed for wheezing or shortness of breath. 18 g 0  amphetamine-dextroamphetamine (ADDERALL) 20 MG tablet Take 1 tablet (20 mg total) by mouth 2 (two) times daily. 60 tablet 0   levonorgestrel (MIRENA) 20 MCG/DAY IUD 1 each by Intrauterine route once.     No current facility-administered medications on file prior to visit.    Review of Systems  Constitutional:  Positive for appetite change and fatigue. Negative for fever.  HENT:  Positive for congestion, postnasal drip, rhinorrhea, sinus pressure, sneezing and sore throat. Negative for ear pain.   Eyes:  Negative for pain and discharge.  Respiratory:  Positive for cough, chest tightness and wheezing. Negative for shortness of breath and stridor.   Cardiovascular:  Negative for chest pain.  Gastrointestinal:  Negative for diarrhea, nausea and vomiting.  Genitourinary:  Negative for frequency, hematuria and urgency.  Musculoskeletal:  Negative for arthralgias and myalgias.  Skin:  Negative for rash.  Neurological:  Positive for headaches. Negative for dizziness, weakness and light-headedness.  Psychiatric/Behavioral:  Negative for confusion and dysphoric mood.        Objective:   Physical Exam Constitutional:      General: She is not in acute distress.    Appearance: Normal appearance. She is well-developed. She is obese. She is not ill-appearing, toxic-appearing or diaphoretic.  HENT:     Head: Normocephalic and atraumatic.     Comments: Nares are injected and congested    Some frontal sinus tenderness    Right Ear: Tympanic membrane, ear canal and external ear normal.     Left Ear: Tympanic membrane, ear canal and external ear normal.     Nose: Congestion and rhinorrhea present.      Mouth/Throat:     Mouth: Mucous membranes are moist.     Pharynx: Oropharynx is clear. No oropharyngeal exudate or posterior oropharyngeal erythema.     Comments: Clear pnd  Eyes:     General:        Right eye: No discharge.        Left eye: No discharge.     Conjunctiva/sclera: Conjunctivae normal.     Pupils: Pupils are equal, round, and reactive to light.  Cardiovascular:     Rate and Rhythm: Normal rate.     Heart sounds: Normal heart sounds.  Pulmonary:     Effort: Pulmonary effort is normal. No respiratory distress.     Breath sounds: No stridor. Wheezing present. No rhonchi or rales.     Comments: Some end exp wheezes  No prolonged exp phase Few scattered rhonchi  No rales  Chest:     Chest wall: No tenderness.  Musculoskeletal:     Cervical back: Normal range of motion and neck supple.  Lymphadenopathy:     Cervical: No cervical adenopathy.  Skin:    General: Skin is warm and dry.     Capillary Refill: Capillary refill takes less than 2 seconds.     Findings: No rash.  Neurological:     Mental Status: She is alert.     Cranial Nerves: No cranial nerve deficit.  Psychiatric:        Mood and Affect: Mood normal.           Assessment & Plan:   Problem List Items Addressed This Visit       Respiratory   Viral URI with cough - Primary   In pt with history of reactive airways who was exp to flu B (father)  Coughing/some wheeze and tightness/ temp has been low grade at home  Reassuring exam Possibly  mild flu   Disc symptomatic care - see instructions on AVS Prometh dm sent / caution of sedation (per pt tessalon does not work) Low dose prednisone for wheeze 20 mg daily 5 d (does not tolerate well) Continue albuterol mdi prn   Update if not starting to improve in a week or if worsening  Call back and Er precautions noted in detail today

## 2023-06-15 DIAGNOSIS — Z419 Encounter for procedure for purposes other than remedying health state, unspecified: Secondary | ICD-10-CM | POA: Diagnosis not present

## 2023-06-15 LAB — HEPATITIS C ANTIBODY: Hepatitis C Ab: NONREACTIVE

## 2023-06-16 ENCOUNTER — Encounter: Payer: Self-pay | Admitting: Family Medicine

## 2023-06-26 ENCOUNTER — Other Ambulatory Visit: Payer: Self-pay | Admitting: Family Medicine

## 2023-06-26 MED ORDER — WEGOVY 0.25 MG/0.5ML ~~LOC~~ SOAJ
SUBCUTANEOUS | 2 refills | Status: DC
Start: 2023-06-26 — End: 2023-08-08

## 2023-07-18 ENCOUNTER — Encounter: Payer: Self-pay | Admitting: Family Medicine

## 2023-07-18 ENCOUNTER — Other Ambulatory Visit: Payer: Self-pay | Admitting: Family Medicine

## 2023-07-18 DIAGNOSIS — G4733 Obstructive sleep apnea (adult) (pediatric): Secondary | ICD-10-CM

## 2023-07-19 MED ORDER — AMPHETAMINE-DEXTROAMPHETAMINE 20 MG PO TABS: 20.0000 mg | ORAL_TABLET | Freq: Two times a day (BID) | ORAL | 0 refills | Status: DC

## 2023-07-27 DIAGNOSIS — Z419 Encounter for procedure for purposes other than remedying health state, unspecified: Secondary | ICD-10-CM | POA: Diagnosis not present

## 2023-08-04 ENCOUNTER — Other Ambulatory Visit: Payer: Self-pay | Admitting: Family Medicine

## 2023-08-07 ENCOUNTER — Telehealth: Payer: Self-pay

## 2023-08-07 NOTE — Progress Notes (Signed)
 Care Guide Pharmacy Note  08/07/2023 Name: [name] MRN: [id number] DOB: 1981-02-26  Referred By: Donnie Galea, MD Reason for referral: Complex Care Management (Outreach to schedule with Pharm d )   Crystal Hammond is a 43 y.o. year old female who is a primary care patient of Donnie Galea, MD.  Ilda L Bastone was referred to the pharmacist for assistance related to:  sleep apnea  Successful contact was made with the patient to discuss pharmacy services including being ready for the pharmacist to call at least 5 minutes before the scheduled appointment time and to have medication bottles and any blood pressure readings ready for review. The patient agreed to meet with the pharmacist via telephone visit on (date/time).08/08/2023  Lenton Rail , RMA     [place]  [facility], [facility] Guide  Direct Dial: [phone]  Website: [email or web address]

## 2023-08-08 ENCOUNTER — Other Ambulatory Visit (INDEPENDENT_AMBULATORY_CARE_PROVIDER_SITE_OTHER): Admitting: Pharmacist

## 2023-08-08 ENCOUNTER — Encounter: Payer: Self-pay | Admitting: Pharmacist

## 2023-08-08 DIAGNOSIS — G4733 Obstructive sleep apnea (adult) (pediatric): Secondary | ICD-10-CM

## 2023-08-08 NOTE — Progress Notes (Signed)
   08/08/2023 Name: Crystal Hammond MRN: [id number] DOB: October 30, 1980  Subjective  Chief Complaint  Patient presents with   Medication Access   Sleep Apnea    Care Team: Primary Care Provider: Donnie Galea, MD  Reason for visit: ?  ANALYAH Hammond is a 43 y.o. female who presents today for a telephone visit with the pharmacist due to medication access concerns regarding Zepbound.   Prescription drug coverage: YES Payor: [employer] / Plan: [facility] / Product Type: *No Product type* / .   HPI: Diagnosed with sleep apnea per home sleep study results 2023. Reports good adherence to current CPAP though remains fatigued during the day. Notes prior [employer]  Rx was not covered by her insurance plan with a copay >$1000. Is unsure of her Medicaid status. Believes she has only Medicaid Family Planning benefit, not full Medicaid.   Assessment and Plan:   1. OSA Would benefit from GLP1RA in the setting of OSA and morbid obesity.   Per [facility] Plan, Zepbound is not covered for OSA unfortunately. Within [facility], Medicaid defaults based on patient's name/DOB. Will try for PA through this plan to see what outcomes is/understand if this is just [facility] or something more.  Zepbound Prior authorization: [employer] (Medicaid) - CMM Key: [id number]  -  DENIED, must try preferred agent  [place]  Prior authorization: [employer] (Medicaid) - CMM Key: [id number] - Pending  Follow Up: Will update patient with result of prior authorization  No future appointments.   Daron Ellen, PharmD Clinical Pharmacist [facility] [phone]

## 2023-08-08 NOTE — Progress Notes (Addendum)
 Prior Authorization:   Medication: Zepbound Prior Authorization Submitted: 08/08/23 DENIED: Must try and fail preferred formulary medication   Will proceed with prior authorization for Wegovy  in place of Zepbound per Medicaid denial.    Baseline Weight:  Weight: 187 lb (85.2 kg) - Patient confirms weight today on home scale is 187 lb, unchanged from previous.  Height: 4'11" BMI: 37.8 kg/m2  Wt Readings from Home Monitoring:  08/08/23 187 lb 12.8 oz (85.2 kg)   Wt Readings from Last 5 Encounters:  06/10/23 187 lb 12.8 oz (85.2 kg)  05/23/23 188 lb (85.3 kg)  05/10/23 189 lb 2 oz (85.8 kg)  05/07/23 190 lb 9.6 oz (86.5 kg)  03/07/23 176 lb 12.8 oz (80.2 kg)   Temp Readings from Last 3 Encounters:  06/14/23 98.1 F (36.7 C) (Oral)  06/10/23 98 F (36.7 C)  05/07/23 98.2 F (36.8 C) (Oral)   BP Readings from Last 3 Encounters:  06/14/23 130/74  06/10/23 122/62  05/23/23 113/73   Pulse Readings from Last 3 Encounters:  06/14/23 92  06/10/23 83  05/23/23 82    Patient continues with dietary and exercise goals. Weight loss has been steady though slow (1-2 lb per month). She is indicated for GLP1RA as an adjunct to her diet and exercise plan given BMI >30 kg/m2 indicating obesity. Additional cardiometabolic risk factors include OSA on sleep study  and prediabetes. Zepbound non-preferred per [place] denial. Reasonable to start Wegovy  0.25 mg sq once weekly.   Prior Authorization:   Medication: Wegovy  2.5 mg Prior Authorization Submitted: 08/08/2023 CMM Code: [id number]

## 2023-08-09 ENCOUNTER — Other Ambulatory Visit: Payer: Self-pay | Admitting: Pharmacist

## 2023-08-09 NOTE — Progress Notes (Signed)
 Documentation: Prior authorization for Wegovy  APPROVED thorough [employer] [place] Medicaid  Called [facility] and provided [employer] information per [employer].     [facility] attempted to bill insurance and confirm Wegovy  went through for $4/month.   PLAN: Called patient and discussed: Reviewed GLP1 with paitent including mechanism of action, possible side effects, missed dose instructions, and titration schedule.  Reviewed with the patient that while on a GLP1RA, eating smaller meals, avoiding high-fat foods, and remaining upright after eating may reduce nausea. Also discussed that overeating is a major trigger of nausea with GLP1RAs, as often times patients will start to feel full sooner and may need to decrease portion sizes from what they were previously accustomed to.  Provided Wegovy  pen instructional information via [employer]  Reminded her not to refill the 0.25 mg dose, she will be increased to 0.5 mg after 4 weeks.    Start Wegovy  0.25 mg sq weekly x 4 weeks Then increase to Wegovy  0.5 mg sq weekly x 4 weeks  Phone check-in with PharmD in 6-7 weeks for titration  No future appointments.

## 2023-08-11 MED ORDER — WEGOVY 0.5 MG/0.5ML ~~LOC~~ SOAJ: SUBCUTANEOUS | 0 refills | Status: DC

## 2023-08-11 NOTE — Progress Notes (Signed)
 Agree. Thanks

## 2023-08-26 DIAGNOSIS — Z419 Encounter for procedure for purposes other than remedying health state, unspecified: Secondary | ICD-10-CM | POA: Diagnosis not present

## 2023-09-05 ENCOUNTER — Encounter: Payer: Self-pay | Admitting: Family Medicine

## 2023-09-09 ENCOUNTER — Other Ambulatory Visit: Payer: Self-pay | Admitting: Family Medicine

## 2023-09-09 MED ORDER — WEGOVY 1 MG/0.5ML ~~LOC~~ SOAJ: 1.0000 mg | SUBCUTANEOUS | 3 refills | Status: DC

## 2023-09-24 ENCOUNTER — Ambulatory Visit (INDEPENDENT_AMBULATORY_CARE_PROVIDER_SITE_OTHER): Admitting: Primary Care

## 2023-09-24 ENCOUNTER — Encounter: Payer: Self-pay | Admitting: Primary Care

## 2023-09-24 ENCOUNTER — Ambulatory Visit: Payer: Self-pay | Admitting: Primary Care

## 2023-09-24 VITALS — BP 102/58 | HR 84 | Temp 98.1°F | Ht 59.0 in | Wt 187.0 lb

## 2023-09-24 DIAGNOSIS — J0100 Acute maxillary sinusitis, unspecified: Secondary | ICD-10-CM | POA: Diagnosis not present

## 2023-09-24 LAB — POC COVID19 BINAXNOW: SARS Coronavirus 2 Ag: NEGATIVE

## 2023-09-24 MED ORDER — PREDNISONE 20 MG PO TABS: 20.0000 mg | ORAL_TABLET | Freq: Every day | ORAL | 0 refills | Status: AC

## 2023-09-24 NOTE — Assessment & Plan Note (Addendum)
 Symptoms representative of viral etiology at this point.  Negative COVID-19 test today. We discussed that if symptoms progress or do not improve in 3 days then to notify us .  Start prednisone  20 mg daily x 5 days.  She can tolerate low doses. We also discussed nasal sprays, rest.     Work note provided.

## 2023-09-24 NOTE — Progress Notes (Signed)
 Subjective:    Patient ID: Crystal Hammond, female    DOB: 07-31-1980, 43 y.o.   MRN: [id number]  Cough Associated symptoms include chills, headaches and rhinorrhea. Pertinent negatives include no fever.    Crystal Hammond is a very pleasant 43 y.o. female patient of Dr. Vallarie Gauze with a history of migraines, asthmatic bronchitis, microscopic hematuria, kidney stone, prediabetes who presents today to discuss sinus pressure.   Symptom onset 4 days ago with maxillary sinus pressure with headaches. She then noticed right lower back pain with radiation up. Three days ago she began noticing increased sinus pressure, vomited three times. Since then she's noticed continued headaches and sinus pressure, dental pain, rhinorrhea.   She's been taking Dayquil, Vicks, Neti Pot rinses without improvement. She denies fevers. She has not tested for Covid-19 infection.  Prior to symptom onset she was at an outdoor baseball game. She has an allergy  to prednisone  but can tolerate at lower doses. The allergy  is facial flushing.    Review of Systems  Constitutional:  Positive for chills and fatigue. Negative for fever.  HENT:  Positive for congestion, rhinorrhea, sinus pressure and sinus pain.   Respiratory:  Negative for cough.   Neurological:  Positive for headaches.         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)   Heart murmur    OUTGREW   History of febrile seizure    History of kidney stones    History of pneumonia    Kidney stone    Left nephrolithiasis    Migraines    OCD (obsessive compulsive disorder)    Oral herpes    Pneumonia    PONV (postoperative nausea and vomiting)    Postpartum depression    Pre-diabetes    Psoriasis    PTSD (post-traumatic stress disorder)    Recurrent UTI    Rosacea     Sleep apnea    Strep pharyngitis 06/29/2022    Social History   Socioeconomic History   Marital status: Married    Spouse name: Dustin   Number of children: 4   Years of education: Not on file   Highest education level: Associate degree: occupational, Scientist, product/process development, or vocational program  Occupational History   Occupation: Curator: [employer]  Tobacco Use   Smoking status: Former    Current packs/day: 0.00    Average packs/day: 0.3 packs/day for 3.0 years (0.9 ttl pk-yrs)    Types: Cigarettes    Start date: 2010    Quit date: 2013    Years since quitting: 12.4    Passive exposure: Past   Smokeless tobacco: Never  Vaping Use   Vaping status: Never Used  Substance and Sexual Activity   Alcohol use: Not Currently    Alcohol/week: 2.0 standard drinks of alcohol    Types: 2 Cans of beer per week   Drug use: Never   Sexual activity: Yes    Birth control/protection: None  Other Topics Concern   Not on file  Social History Narrative   Three sons and one daughter. Second oldest has autism spectrum disorder and two oldest ADHD.   Right handed   Caffeine- 1 cup per day    Social Drivers of Health   Financial  Resource Strain: Low Risk  (10/08/2022)   Overall Financial Resource Strain (CARDIA)    Difficulty of Paying Living Expenses: Not very hard  Food Insecurity: No Food Insecurity (10/08/2022)   Hunger Vital Sign    Worried About Running Out of Food in the Last Year: Never true    Ran Out of Food in the Last Year: Never true  Transportation Needs: No Transportation Needs (10/08/2022)   PRAPARE - Administrator, Civil Service (Medical): No    Lack of Transportation (Non-Medical): No  Physical Activity: Sufficiently Active (10/08/2022)   Exercise Vital Sign    Days of Exercise per Week: 3 days    Minutes of Exercise per Session: 60 min  Stress: Stress Concern Present (10/08/2022)   [employer] of Occupational Health - Occupational Stress  Questionnaire    Feeling of Stress : Very much  Social Connections: Moderately Isolated (10/08/2022)   Social Connection and Isolation Panel [NHANES]    Frequency of Communication with Friends and Family: More than three times a week    Frequency of Social Gatherings with Friends and Family: Twice a week    Attends Religious Services: Never    Database administrator or Organizations: No    Attends Engineer, structural: Not on file    Marital Status: Married  Catering manager Violence: Not on file    Past Surgical History:  Procedure Laterality Date   ABDOMINAL SURGERY  2012   Endometrial Surgery - went through c-section scar   CARPAL TUNNEL RELEASE Right 08/07/2021   Procedure: RIGHT CARPAL TUNNEL RELEASE;  Surgeon: Marilyn Shropshire, MD;  Location: [facility];  Service: Orthopedics;  Laterality: Right;   CARPAL TUNNEL RELEASE Left 08/30/2021   Procedure: LEFT CARPAL TUNNEL RELEASE;  Surgeon: Marilyn Shropshire, MD;  Location: [facility];  Service: Orthopedics;  Laterality: Left;   CESAREAN SECTION  2019   CESAREAN SECTION  2007   CESAREAN SECTION  2014   CESAREAN SECTION  2016   CYSTOSCOPY/URETEROSCOPY/HOLMIUM LASER/STENT PLACEMENT Left 02/11/2023   Procedure: CYSTOSCOPY/URETEROSCOPY/HOLMIUM LASER/STENT PLACEMENT;  Surgeon: Dustin Gimenez, MD;  Location: [facility];  Service: Urology;  Laterality: Left;   ENDOMETRIAL BIOPSY  2011   EXTRACORPOREAL SHOCK WAVE LITHOTRIPSY Left 12/27/2022   Procedure: EXTRACORPOREAL SHOCK WAVE LITHOTRIPSY (ESWL);  Surgeon: Lawerence Pressman, MD;  Location: [facility];  Service: Urology;  Laterality: Left;   GASTROSCHISIS CLOSURE     LEEP     x 2   VENTRAL HERNIA REPAIR N/A 12/11/2019   Procedure: HERNIA REPAIR VENTRAL ADULT;  Surgeon: Eldred Grego, MD;  Location: [facility];  Service: General;  Laterality: N/A;   WISDOM TOOTH EXTRACTION     XI ROBOTIC ASSISTED VENTRAL HERNIA N/A 12/11/2019   Procedure: XI ROBOTIC  ASSISTED VENTRAL HERNIA Lap vs open;  Surgeon: Eldred Grego, MD;  Location: [facility];  Service: General;  Laterality: N/A;    Family History  Problem Relation Age of Onset   Depression Mother    Alcohol abuse Mother    Hernia Mother    Rashes / Skin problems Mother    Thyroid  disease Mother    Anxiety disorder Mother    Depression Father    Alcohol abuse Father    Gout Father    Diabetes Father    Asthma Brother    Bipolar disorder Brother    Drug abuse Brother    ADD / ADHD Brother    Colon cancer Paternal Grandmother  Allergic rhinitis Son    Asthma Son    ADD / ADHD Son    Allergic rhinitis Son    ADD / ADHD Son    Breast cancer Maternal Aunt    Lung cancer Maternal Aunt    Ovarian cancer Neg Hx     Allergies  Allergen Reactions   Other Swelling    SHELLFISH    Phenazopyridine Hives and Rash   Benadryl  [Diphenhydramine ]    Prednisone      Intolerant- skin feels hot   Zomig  [Zolmitriptan ]     Diffuse aches.      Current Outpatient Medications on File Prior to Visit  Medication Sig Dispense Refill   albuterol  (PROVENTIL ) (2.5 MG/3ML) 0.083% nebulizer solution Inhale 3 mLs (2.5 mg total) by nebulization every 6 (six) hours as needed for wheezing or shortness of breath. 150 mL 1   albuterol  (VENTOLIN  HFA) 108 (90 Base) MCG/ACT inhaler Inhale 2 puffs into the lungs every 6 (six) hours as needed for wheezing or shortness of breath. 18 g 0   levonorgestrel  (MIRENA ) 20 MCG/DAY IUD 1 each by Intrauterine route once.     Semaglutide -Weight Management (WEGOVY ) 1 MG/0.5ML SOAJ Inject 1 mg into the skin once a week. 2 mL 3   amphetamine -dextroamphetamine  (ADDERALL ) 20 MG tablet Take 1 tablet (20 mg total) by mouth 2 (two) times daily. (Patient not taking: Reported on 09/24/2023) 60 tablet 0   No current facility-administered medications on file prior to visit.    BP (!) 102/58   Pulse 84   Temp 98.1 F (36.7 C) (Temporal)   Ht 4\' 11"  (1.499 m)   Wt 187 lb  (84.8 kg)   SpO2 99%   BMI 37.77 kg/m  Objective:   Physical Exam Constitutional:      Appearance: She is not ill-appearing.  HENT:     Right Ear: Tympanic membrane and ear canal normal.     Left Ear: Tympanic membrane and ear canal normal.     Nose: No mucosal edema.     Right Sinus: No maxillary sinus tenderness or frontal sinus tenderness.     Left Sinus: No maxillary sinus tenderness or frontal sinus tenderness.     Mouth/Throat:     Mouth: Mucous membranes are moist.  Eyes:     Conjunctiva/sclera: Conjunctivae normal.  Cardiovascular:     Rate and Rhythm: Normal rate and regular rhythm.  Pulmonary:     Effort: Pulmonary effort is normal.     Breath sounds: Normal breath sounds. No wheezing or rhonchi.  Musculoskeletal:     Cervical back: Neck supple.  Skin:    General: Skin is warm and dry.           Assessment & Plan:  Acute non-recurrent maxillary sinusitis Assessment & Plan: Symptoms representative of viral etiology at this point.  Negative COVID-19 test today. We discussed that if symptoms progress or do not improve in 3 days then to notify us .  Start prednisone  20 mg daily x 5 days.  She can tolerate low doses. We also discussed nasal sprays, rest.     Work note provided.  Orders: -     predniSONE ; Take 1 tablet (20 mg total) by mouth daily with breakfast for 5 days.  Dispense: 5 tablet; Refill: 0        Gabriel John, NP

## 2023-09-24 NOTE — Patient Instructions (Signed)
 Start prednisone  20 mg once daily x 5 days.  Please notify us  Friday this week if you are worse or not improving.  It was a pleasure meeting you!

## 2023-09-24 NOTE — Addendum Note (Signed)
 Addended by: Kyle Pho on: 09/24/2023 03:24 PM   Modules accepted: Orders

## 2023-09-26 DIAGNOSIS — Z419 Encounter for procedure for purposes other than remedying health state, unspecified: Secondary | ICD-10-CM | POA: Diagnosis not present

## 2023-10-01 ENCOUNTER — Encounter: Payer: Self-pay | Admitting: Family Medicine

## 2023-10-01 NOTE — Telephone Encounter (Signed)
 I checked [facility] against what we had for her immunization and they match. She may have to reach out to [employer] to get those records. That's all that I can think of unless you have any other suggestions.   Do you want her to increase her dose of the wegovy 

## 2023-10-02 ENCOUNTER — Other Ambulatory Visit: Payer: Self-pay | Admitting: Family Medicine

## 2023-10-02 MED ORDER — WEGOVY 1.7 MG/0.75ML ~~LOC~~ SOAJ: 1.7000 mg | SUBCUTANEOUS | 2 refills | Status: DC

## 2023-10-26 DIAGNOSIS — Z419 Encounter for procedure for purposes other than remedying health state, unspecified: Secondary | ICD-10-CM | POA: Diagnosis not present

## 2023-11-04 ENCOUNTER — Ambulatory Visit

## 2023-11-11 ENCOUNTER — Encounter: Payer: Self-pay | Admitting: Family Medicine

## 2023-11-13 ENCOUNTER — Ambulatory Visit
Admission: RE | Admit: 2023-11-13 | Discharge: 2023-11-13 | Disposition: A | Discharge status: Home / Self Care | Source: Ambulatory Visit | Attending: Urology | Admitting: Urology

## 2023-11-13 DIAGNOSIS — N200 Calculus of kidney: Secondary | ICD-10-CM | POA: Diagnosis present

## 2023-11-13 DIAGNOSIS — N134 Hydroureter: Secondary | ICD-10-CM | POA: Diagnosis present

## 2023-11-14 ENCOUNTER — Other Ambulatory Visit: Payer: Self-pay | Admitting: Family Medicine

## 2023-11-14 MED ORDER — WEGOVY 2.4 MG/0.75ML ~~LOC~~ SOAJ: 2.4000 mg | SUBCUTANEOUS | 3 refills | Status: DC

## 2023-11-25 ENCOUNTER — Encounter: Payer: Self-pay | Admitting: Family Medicine

## 2023-11-25 ENCOUNTER — Ambulatory Visit: Payer: Self-pay

## 2023-11-25 ENCOUNTER — Ambulatory Visit: Admitting: Family Medicine

## 2023-11-25 ENCOUNTER — Ambulatory Visit
Admission: RE | Admit: 2023-11-25 | Discharge: 2023-11-25 | Disposition: A | Discharge status: Home / Self Care | Source: Ambulatory Visit | Attending: Family Medicine | Admitting: Family Medicine

## 2023-11-25 VITALS — BP 128/78 | HR 82 | Temp 99.0°F | Ht 59.0 in | Wt 185.4 lb

## 2023-11-25 DIAGNOSIS — N200 Calculus of kidney: Secondary | ICD-10-CM

## 2023-11-25 DIAGNOSIS — R300 Dysuria: Secondary | ICD-10-CM

## 2023-11-25 DIAGNOSIS — N390 Urinary tract infection, site not specified: Secondary | ICD-10-CM | POA: Diagnosis not present

## 2023-11-25 LAB — BASIC METABOLIC PANEL WITH GFR
BUN: 8 mg/dL (ref 6–23)
CO2: 29 meq/L (ref 19–32)
Calcium: 9.4 mg/dL (ref 8.4–10.5)
Chloride: 103 meq/L (ref 96–112)
Creatinine, Ser: 0.64 mg/dL (ref 0.40–1.20)
GFR: 109.72 mL/min (ref >60.00)
Glucose, Bld: 81 mg/dL (ref 70–99)
Potassium: 4 meq/L (ref 3.5–5.1)
Sodium: 139 meq/L (ref 135–145)

## 2023-11-25 LAB — POC URINALSYSI DIPSTICK (AUTOMATED)
Bilirubin, UA: NEGATIVE
Blood, UA: NEGATIVE
Glucose, UA: NEGATIVE
Ketones, UA: NEGATIVE
Leukocytes, UA: NEGATIVE
Nitrite, UA: NEGATIVE
Protein, UA: NEGATIVE
Spec Grav, UA: 1.01 (ref 1.010–1.025)
Urobilinogen, UA: NEGATIVE U/dL — AB
pH, UA: 6 (ref 5.0–8.0)

## 2023-11-25 MED ORDER — FLUOXETINE HCL 10 MG PO CAPS: ORAL_CAPSULE | ORAL | 0 refills | Status: DC

## 2023-11-25 MED ORDER — ONDANSETRON HCL 4 MG PO TABS: 4.0000 mg | ORAL_TABLET | Freq: Three times a day (TID) | ORAL | 1 refills | Status: DC | PRN

## 2023-11-25 MED ORDER — AMPHETAMINE-DEXTROAMPHETAMINE 20 MG PO TABS: 20.0000 mg | ORAL_TABLET | Freq: Every day | ORAL | 0 refills | Status: DC

## 2023-11-25 MED ORDER — HYDROCODONE-ACETAMINOPHEN 5-325 MG PO TABS: 0.5000 | ORAL_TABLET | Freq: Three times a day (TID) | ORAL | 0 refills | Status: DC | PRN

## 2023-11-25 NOTE — Telephone Encounter (Signed)
 FYI Only or Action Required?: FYI only for provider.  Patient was last seen in primary care on 09/24/2023 by Gretta Comer POUR, NP.  Called Nurse Triage reporting Flank Pain.  Symptoms began 2 days ago.  Interventions attempted: Rest, hydration, or home remedies.  Symptoms are: unchanged.  Triage Disposition: See HCP Within 4 Hours (Or PCP Triage)  Patient/caregiver understands and will follow disposition?: Yes, will follow disposition  Copied from CRM #[id number]. Topic: Clinical - Red Word Triage >> Nov 25, 2023  7:35 AM Essie A wrote: Red Word that prompted transfer to Nurse Triage: Patient says she has a UTI.   Symptoms are back pain, nausea, chills, no fever.  Had an ultra sound on her kidneys 11/13/23.  Didn't show any stones but has passed 2 stones since then.  This has been going on for 3 days now. Reason for Disposition  Pain or burning with passing urine (urination)  Answer Assessment - Initial Assessment Questions 1. LOCATION: Where does it hurt? (e.g., left, right)     R flank 2. ONSET: When did the pain start?     2 days ago 3. SEVERITY: How bad is the pain? (e.g., Scale 1-10; mild, moderate, or severe)     7 4. PATTERN: Does the pain come and go, or is it constant?      Intermittent, worse with voiding 5. CAUSE: What do you think is causing the pain?     Kidney stones 6. OTHER SYMPTOMS:  Do you have any other symptoms? (e.g., fever, abdomen pain, vomiting, leg weakness, burning with urination, blood in urine)     Nausea, chills,  7. PREGNANCY:  Is there any chance you are pregnant? When was your last menstrual period?     Denies  States she feels she passed 2 stones since pain started. Denies blood in urine. Pt states urine is dark yellow.  Protocols used: Flank Pain-A-AH

## 2023-11-25 NOTE — Telephone Encounter (Signed)
Noted. Thanks. See OV note.  

## 2023-11-25 NOTE — Patient Instructions (Signed)
 Hydrocodone  for pain, sedation caution.  Zofran  for nausea.  Labs and KUB on the way out.  Take care.  Glad to see you.

## 2023-11-25 NOTE — Progress Notes (Signed)
 Pain started 2 days ago.  Recently passed 2 small renal stones.  She had recent u/s done, prior to passing stones.  U/a wnl at OV.  She had darker urine recently.  No fever but had a sweat this AM along with nausea.  Still with flank pain.  R lower back pain.  This doesn't feel likely prev UTI sx.  Tylenol  and ibuprofen didn't help.    No ADE on wegovy .    Off adderall , mood is lower.  Off prozac , d/w pt. No SI/HI.    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   Meds, vitals, and allergies reviewed.   ROS: Per HPI unless specifically indicated in ROS section   Nad Ncat Neck supple, no LA Rrr Ctab R flank sore to palpation.   Abd soft, not ttp.  Skin well perfused.

## 2023-11-26 DIAGNOSIS — Z419 Encounter for procedure for purposes other than remedying health state, unspecified: Secondary | ICD-10-CM | POA: Diagnosis not present

## 2023-11-26 LAB — URINE CULTURE
MICRO NUMBER:: 16813559
Result:: NO GROWTH
SPECIMEN QUALITY:: ADEQUATE

## 2023-11-27 ENCOUNTER — Ambulatory Visit: Payer: Self-pay | Admitting: Family Medicine

## 2023-11-27 NOTE — Assessment & Plan Note (Signed)
 Recently passed 2 stones.  Discussed options. Hydrocodone  for pain, sedation caution.  Zofran  for nausea.  Labs and KUB on the way out, eval for stones and UTI respectively.  At this point still okay for outpatient follow-up.  She agrees to plan.

## 2023-12-12 ENCOUNTER — Ambulatory Visit: Payer: Self-pay

## 2023-12-12 NOTE — Telephone Encounter (Signed)
 FYI Only or Action Required?: FYI only for provider.  Patient was last seen in primary care on 11/25/2023 by Cleatus Arlyss RAMAN, MD.  Called Nurse Triage reporting Melena.  Symptoms began several days ago.  Interventions attempted: Rest, hydration, or home remedies.  Symptoms are: unchanged.  Triage Disposition: Go to ED Now (Notify PCP)  Patient/caregiver understands and will follow disposition?:   Copied from CRM [id number]. Topic: Clinical - Red Word Triage >> Dec 12, 2023  3:15 PM Deaijah H wrote: Red Word that prompted transfer to Nurse Triage: Black bowel movements Reason for Disposition  Black or tarry bowel movements  (Exception: Chronic-unchanged black-grey BMs AND is taking iron pills or Pepto-Bismol.)  Answer Assessment - Initial Assessment Questions Felt like a decrease of appetite, felt uneasy, slightly nauseous on Monday. Diarrhea started overnight into Tuesday. or 4 BM today, all black. Abdominal pain (by hip on right side) Mon, Tues, Wednesday.   1. COLOR: What color is your stool? Is that color in part or all of the stool? (e.g., black, clay-colored, green, red)      Black,  2. ONSET: When did you first notice this color change?     Today   3. CAUSE: Have you eaten any food or taken any medicine of this color? Note: See listing in Background Information section.      No  4. OTHER SYMPTOMS: Do you have any other symptoms? (e.g., abdomen pain, diarrhea, fever, yellow eyes or skin). Diarrhea, abdominal pain  Answer Assessment - Initial Assessment Questions Felt like a decrease of appetite, felt uneasy, slightly nauseous with abdominal pain by right hip on Monday. Diarrhea started overnight into Tuesday. Had 3 or 4 black bowel movements today.   1. APPEARANCE of BLOOD: What color is it? Is it passed separately, on the surface of the stool, or mixed in with the stool?      Denies appearance of blood in stool, in toilet water and on toilet paper.  3.  FREQUENCY: How many times has blood been passed with the stools?      3-4 black bowel movements today  5. DIARRHEA: Is there also some diarrhea? If Yes, ask: How many diarrhea stools in the past 24 hours?      Diarrhea starting Monday night into Tuesday   7. RECURRENT SYMPTOMS: Have you had blood in your stools before? If Yes, ask: When was the last time? and What happened that time?      No  9. OTHER SYMPTOMS: Do you have any other symptoms?  (e.g., abdomen pain, vomiting, dizziness, fever)     Abdominal pain, fatigue.  Protocols used: Stools - Unusual Color-A-AH, Rectal Bleeding-A-AH

## 2023-12-13 NOTE — Telephone Encounter (Signed)
 Called patient and reviewed all information. Patient verbalized understanding.  Patient was able to be seen by [facility] ED yesterday. They gave her some meds and advised she see GI. Advised patient if she is not able to be seen with GI soon or if symptoms worsen in the meantime  she can schedule f/u visit with Dr. Cleatus. She will call back once she hears from GI clinic about being seen.

## 2023-12-13 NOTE — Telephone Encounter (Addendum)
 Agree with ER rec.  Thanks.   Please get update on patient.

## 2023-12-13 NOTE — Telephone Encounter (Signed)
 Noted. Thanks.

## 2023-12-17 ENCOUNTER — Other Ambulatory Visit: Payer: Self-pay | Admitting: Family Medicine

## 2023-12-17 NOTE — Telephone Encounter (Signed)
 LAST APPOINTMENT DATE: 11/25/2023   NEXT APPOINTMENT DATE: Visit date not found    LAST REFILL:11/25/23  QTY: #53 w/ 0 refills

## 2023-12-25 ENCOUNTER — Ambulatory Visit: Payer: Self-pay

## 2023-12-25 ENCOUNTER — Encounter: Payer: Self-pay | Admitting: Nurse Practitioner

## 2023-12-25 ENCOUNTER — Ambulatory Visit (INDEPENDENT_AMBULATORY_CARE_PROVIDER_SITE_OTHER): Admitting: Nurse Practitioner

## 2023-12-25 VITALS — BP 96/65 | HR 72 | Temp 98.9°F | Ht 59.0 in | Wt 184.8 lb

## 2023-12-25 DIAGNOSIS — J45901 Unspecified asthma with (acute) exacerbation: Secondary | ICD-10-CM | POA: Diagnosis not present

## 2023-12-25 MED ORDER — AZITHROMYCIN 250 MG PO TABS: ORAL_TABLET | ORAL | 0 refills | Status: AC

## 2023-12-25 MED ORDER — PSEUDOEPH-BROMPHEN-DM 30-2-10 MG/5ML PO SYRP: 5.0000 mL | ORAL_SOLUTION | Freq: Four times a day (QID) | ORAL | 0 refills | Status: AC | PRN

## 2023-12-25 NOTE — Telephone Encounter (Signed)
 No appointments available in PCP office Appointment made for [facility] with Leron Glance NP at 10:40AM today 12/25/2023  FYI Only or Action Required?: FYI only for provider.  Patient was last seen in primary care on 11/25/2023 by Cleatus Arlyss RAMAN, MD.  Called Nurse Triage reporting Cough.  Symptoms began a week ago.  Interventions attempted: Prescription medications: inhaler/nebulizer and Rest, hydration, or home remedies.  Symptoms are: gradually worsening.  Triage Disposition: See HCP Within 4 Hours (Or PCP Triage)  Patient/caregiver understands and will follow disposition?: Yes     Copied from CRM [id number]. Topic: Clinical - Red Word Triage >> Dec 25, 2023  8:11 AM Robinson H wrote: [facility] that prompted transfer to Nurse Triage: Cough, wheezing, tightness in chest, coughing up white and yellow mucus. Reason for Disposition  Wheezing is present  Answer Assessment - Initial Assessment Questions Last week started having sinus issues---mucous--couldn't taste anything Multiple negative covid tests at home Cough--persistent during triage Used Neti Pot---still having congestion/a lot of mucous Patient advised to call us  if anything changes Patient is advised that if anything worsens to go to the Emergency Room. Patient verbalized understanding.    1. ONSET: When did the cough begin?      Last week 2. SEVERITY: How bad is the cough today?      ---- 3. SPUTUM: Describe the color of your sputum (e.g., none, dry cough; clear, white, yellow, green)     White and yellow 4. HEMOPTYSIS: Are you coughing up any blood? If Yes, ask: How much? (e.g., flecks, streaks, tablespoons, etc.)     no 5. DIFFICULTY BREATHING: Are you having difficulty breathing? If Yes, ask: How bad is it? (e.g., mild, moderate, severe)      no 6. FEVER: Do you have a fever? If Yes, ask: What is your temperature, how was it measured, and when did it start?     no 7. CARDIAC  HISTORY: Do you have any history of heart disease? (e.g., heart attack, congestive heart failure)      no 8. LUNG HISTORY: Do you have any history of lung disease?  (e.g., pulmonary embolus, asthma, emphysema)     Asthma 9. PE RISK FACTORS: Do you have a history of blood clots? (or: recent major surgery, recent prolonged travel, bedridden)     no 10. OTHER SYMPTOMS: Do you have any other symptoms? (e.g., runny nose, wheezing, chest pain)       Unable to taste 11. PREGNANCY: Is there any chance you are pregnant? When was your last menstrual period?       no  Protocols used: Cough - Acute Productive-A-AH

## 2023-12-25 NOTE — Telephone Encounter (Signed)
 Noted. Thanks.  I am not in clinic today.

## 2023-12-25 NOTE — Progress Notes (Signed)
 Leron Glance, NP-C Phone: [phone]  Crystal Hammond is a 43 y.o. female who presents today for cough.   Discussed the use of AI scribe software for clinical note transcription with the patient, who gave verbal consent to proceed.  History of Present Illness   Crystal Hammond is a 43 year old female with asthma who presents with an 8-day history of cough.  She has been experiencing a persistent cough for 8 days, which worsens at night when lying down. Despite using over-the-counter medications such as DayQuil, NyQuil, Mucinex, Nydamax, and Tylenol , as well as home remedies like tea and honey, the cough persists. She also experiences chest burning and tightness.  She reports a loss of appetite, anosmia, and ageusia. She has taken three COVID tests, all of which were negative. She experiences shortness of breath and fatigue with exertion, such as walking up stairs, and describes the mucus as white and yellow. Sinus congestion, particularly in the frontal region, is present, and she has been using a neti pot to manage drainage.  Her past medical history includes asthma, for which she uses an albuterol  inhaler and a nebulizer. She is allergic to prednisone , which causes her skin to become 'super hot'. She has a history of pneumonia. No fever, body aches, headaches, nausea, vomiting, or ear pain. She has been avoiding dairy products to help with her symptoms.  She maintains fluid intake with tea and water and has tried eating marshmallows as suggested by a pediatrician. Symptoms, particularly the cough, worsen when lying down at night.      Social History   Tobacco Use  Smoking Status Former   Current packs/day: 0.00   Average packs/day: 0.3 packs/day for 3.0 years (0.9 ttl pk-yrs)   Types: Cigarettes   Start date: 2010   Quit date: 2013   Years since quitting: 12.7   Passive exposure: Past  Smokeless Tobacco Never    Current Outpatient Medications on File Prior to Visit   Medication Sig Dispense Refill   albuterol  (PROVENTIL ) (2.5 MG/3ML) 0.083% nebulizer solution Inhale 3 mLs (2.5 mg total) by nebulization every 6 (six) hours as needed for wheezing or shortness of breath. 150 mL 1   albuterol  (VENTOLIN  HFA) 108 (90 Base) MCG/ACT inhaler Inhale 2 puffs into the lungs every 6 (six) hours as needed for wheezing or shortness of breath. 18 g 0   amphetamine -dextroamphetamine  (ADDERALL ) 20 MG tablet Take 1 tablet (20 mg total) by mouth daily. 30 tablet 0   FLUoxetine  (PROZAC ) 10 MG capsule Take 2 capsules (20 mg total) by mouth daily. 180 capsule 1   HYDROcodone -acetaminophen  (NORCO/VICODIN) 5-325 MG tablet Take 0.5-1 tablets by mouth 3 (three) times daily as needed for moderate pain (pain score 4-6) (sedation caution.). 15 tablet 0   levonorgestrel  (MIRENA ) 20 MCG/DAY IUD 1 each by Intrauterine route once.     ondansetron  (ZOFRAN ) 4 MG tablet Take 1 tablet (4 mg total) by mouth every 8 (eight) hours as needed for nausea or vomiting. 20 tablet 1   Semaglutide -Weight Management (WEGOVY ) 2.4 MG/0.75ML SOAJ Inject 2.4 mg into the skin once a week. 3 mL 3   No current facility-administered medications on file prior to visit.     ROS see history of present illness  Objective  Physical Exam Vitals:   12/25/23 1023  BP: 96/65  Pulse: 72  Temp: 98.9 F (37.2 C)  SpO2: 99%    BP Readings from Last 3 Encounters:  12/25/23 96/65  11/25/23 128/78  09/24/23 ROLLEN)  102/58   Wt Readings from Last 3 Encounters:  12/25/23 184 lb 12.8 oz (83.8 kg)  11/25/23 185 lb 6.4 oz (84.1 kg)  09/24/23 187 lb (84.8 kg)    Physical Exam Constitutional:      General: She is not in acute distress.    Appearance: Normal appearance.  HENT:     Head: Normocephalic.     Right Ear: Tympanic membrane normal.     Left Ear: Tympanic membrane normal.     Mouth/Throat:     Mouth: Mucous membranes are moist.     Pharynx: Oropharynx is clear.  Eyes:     Conjunctiva/sclera:  Conjunctivae normal.     Pupils: Pupils are equal, round, and reactive to light.  Cardiovascular:     Rate and Rhythm: Normal rate and regular rhythm.     Heart sounds: Normal heart sounds.  Pulmonary:     Effort: Pulmonary effort is normal.     Breath sounds: Normal breath sounds. No wheezing or rhonchi.  Lymphadenopathy:     Cervical: No cervical adenopathy.  Skin:    General: Skin is warm and dry.  Neurological:     General: No focal deficit present.     Mental Status: She is alert.  Psychiatric:        Mood and Affect: Mood normal.        Behavior: Behavior normal.      Assessment/Plan: Please see individual problem list.  Asthmatic bronchitis with acute exacerbation, unspecified asthma severity, unspecified whether persistent Assessment & Plan: She presents with acute bronchitis characterized by nocturnal cough, mucus production, and asthma exacerbation. COVID tests are negative. While a sinus infection is considered. Start azithromycin  and advised to use an albuterol  inhaler every 4-6 hours for 2-3 days. Her asthma exacerbation is contributing to chest tightness and shortness of breath. She is allergic to prednisone . Recommended increased fluid intake, soothing teas, and honey. Prescribed Bromfed 5-10 mL, with 10 mL at bedtime. Instructed to report if there is no improvement or if high fever, chest pain, or extreme shortness of breath occur she will seek immediate medical attention.   Orders: -     Azithromycin ; Take 2 tablets on day 1, then 1 tablet daily on days 2 through 5  Dispense: 6 tablet; Refill: 0 -     Pseudoeph-Bromphen-DM; Take 5-10 mLs by mouth every 6 (six) hours as needed.  Dispense: 120 mL; Refill: 0      Return if symptoms worsen or fail to improve.   Leron Glance, NP-C [facility]

## 2023-12-27 DIAGNOSIS — Z419 Encounter for procedure for purposes other than remedying health state, unspecified: Secondary | ICD-10-CM | POA: Diagnosis not present

## 2024-01-08 ENCOUNTER — Encounter: Payer: Self-pay | Admitting: Nurse Practitioner

## 2024-01-08 NOTE — Assessment & Plan Note (Signed)
 She presents with acute bronchitis characterized by nocturnal cough, mucus production, and asthma exacerbation. COVID tests are negative. While a sinus infection is considered. Start azithromycin  and advised to use an albuterol  inhaler every 4-6 hours for 2-3 days. Her asthma exacerbation is contributing to chest tightness and shortness of breath. She is allergic to prednisone . Recommended increased fluid intake, soothing teas, and honey. Prescribed Bromfed 5-10 mL, with 10 mL at bedtime. Instructed to report if there is no improvement or if high fever, chest pain, or extreme shortness of breath occur she will seek immediate medical attention.

## 2024-01-30 ENCOUNTER — Other Ambulatory Visit (HOSPITAL_COMMUNITY): Payer: Self-pay

## 2024-01-30 ENCOUNTER — Telehealth: Payer: Self-pay

## 2024-01-30 NOTE — Telephone Encounter (Signed)
 Pharmacy Patient Advocate Encounter   Received notification from Onbase that prior authorization for Wegovy  2.4 is required/requested.   Insurance verification completed.   The patient is insured through [facility] [employer]/ [employer] [facility] [place] Medicaid.   Per test claim: The current 28 day co-pay is, $1/277.35.  No PA needed at this time. This test claim was processed through [facility]- copay amounts may vary at other pharmacies due to pharmacy/plan contracts, or as the patient moves through the different stages of their insurance plan.   Patient has a coordination of benefits, however, due to new [place] Medicaid benefit rules, secondary insurance does not cover GLP1s for weight loss.

## 2024-01-31 ENCOUNTER — Encounter: Payer: Self-pay | Admitting: Family Medicine

## 2024-01-31 ENCOUNTER — Ambulatory Visit: Payer: Self-pay

## 2024-01-31 ENCOUNTER — Ambulatory Visit: Admitting: Family Medicine

## 2024-01-31 VITALS — BP 107/67 | HR 80 | Temp 98.8°F | Ht 59.0 in | Wt 188.7 lb

## 2024-01-31 DIAGNOSIS — L0291 Cutaneous abscess, unspecified: Secondary | ICD-10-CM | POA: Diagnosis not present

## 2024-01-31 DIAGNOSIS — R232 Flushing: Secondary | ICD-10-CM

## 2024-01-31 DIAGNOSIS — N644 Mastodynia: Secondary | ICD-10-CM | POA: Diagnosis not present

## 2024-01-31 NOTE — Progress Notes (Signed)
 Established patient visit   Patient: Crystal Hammond   DOB: 1980-11-14   43 y.o. Female  MRN: [id number] Visit Date: 01/31/2024  Today's healthcare provider: LAURAINE LOISE BUOY, DO   Chief Complaint  Patient presents with   Acute Visit    Patient reports she is here for breast pain and lump on the right side.  Just felt the lump this morning and has been feeling pain for about a week.  Reports her menstrual cycle started Sept 25, 2025.   Subjective    HPI Crystal Hammond is a 43 year old female who presents with breast pain and a lump.  She discovered a lump in her right breast this morning, accompanied by soreness that has persisted for the past week. The pain is described as 'really weird' and 'heavy', and it is tender to touch. No skin changes or nipple discharge. Her last menstrual cycle began on September 25th and was early. She reports that her last mammogram was performed last year and that she was told she has dense breast tissue. She regularly performs self-breast exams and initially thought the soreness might be related to her menstrual cycle.  She has not taken any specific medication for the breast pain, as she has been focusing on managing her headaches, for which she takes Tylenol . She reports an increased frequency of headaches, which she associates with her history of migraines. Additionally, she mentions having hot flashes and feeling hot all the time.  Her family history is notable for an aunt who had a breast lump that was a cyst. There is no family history of breast cancer. She has four children, three of whom were conceived with the aid of Clomid, and she has a Mirena  IUD for contraception. Her husband has had a vasectomy.  No skin changes, nipple discharge, fever, chills, or recent illness. Reports hot flashes and increased frequency of headaches.      Medications: Outpatient Medications Prior to Visit  Medication Sig   albuterol  (PROVENTIL ) (2.5 MG/3ML)  0.083% nebulizer solution Inhale 3 mLs (2.5 mg total) by nebulization every 6 (six) hours as needed for wheezing or shortness of breath.   albuterol  (VENTOLIN  HFA) 108 (90 Base) MCG/ACT inhaler Inhale 2 puffs into the lungs every 6 (six) hours as needed for wheezing or shortness of breath.   brompheniramine-pseudoephedrine-DM 30-2-10 MG/5ML syrup Take 5-10 mLs by mouth every 6 (six) hours as needed.   levonorgestrel  (MIRENA ) 20 MCG/DAY IUD 1 each by Intrauterine route once.   amphetamine -dextroamphetamine  (ADDERALL ) 20 MG tablet Take 1 tablet (20 mg total) by mouth daily. (Patient not taking: Reported on 01/31/2024)   [DISCONTINUED] FLUoxetine  (PROZAC ) 10 MG capsule Take 2 capsules (20 mg total) by mouth daily.   [DISCONTINUED] HYDROcodone -acetaminophen  (NORCO/VICODIN) 5-325 MG tablet Take 0.5-1 tablets by mouth 3 (three) times daily as needed for moderate pain (pain score 4-6) (sedation caution.).   [DISCONTINUED] ondansetron  (ZOFRAN ) 4 MG tablet Take 1 tablet (4 mg total) by mouth every 8 (eight) hours as needed for nausea or vomiting.   [DISCONTINUED] Semaglutide -Weight Management (WEGOVY ) 2.4 MG/0.75ML SOAJ Inject 2.4 mg into the skin once a week.   No facility-administered medications prior to visit.        Objective    BP 107/67 (BP Location: Right Arm, Patient Position: Sitting, Cuff Size: Normal)   Pulse 80   Temp 98.8 F (37.1 C) (Oral)   Ht 4' 11 (1.499 m)   Wt 188 lb 11.2 oz (85.6  kg)   SpO2 97%   BMI 38.11 kg/m     Physical Exam Vitals and nursing note reviewed.  Constitutional:      General: She is not in acute distress.    Appearance: Normal appearance.  HENT:     Head: Normocephalic and atraumatic.  Eyes:     General: No scleral icterus.    Conjunctiva/sclera: Conjunctivae normal.  Cardiovascular:     Rate and Rhythm: Normal rate.  Pulmonary:     Effort: Pulmonary effort is normal.  Chest:       Comments: Extremely tender on palpation in location noted;  unable to appreciate lump noted by patient on exam due to this.  Tenderness in palpation in right axilla as well. Neurological:     Mental Status: She is alert and oriented to person, place, and time. Mental status is at baseline.  Psychiatric:        Mood and Affect: Mood normal.        Behavior: Behavior normal.      No results found for any visits on 01/31/24.  Assessment & Plan    Breast pain, right -     MM 3D DIAGNOSTIC MAMMOGRAM BILATERAL BREAST; Future -     US  LIMITED ULTRASOUND INCLUDING AXILLA RIGHT BREAST; Future -     CBC with Differential/Platelet -     Prolactin  Abscess -     CBC with Differential/Platelet  Vasomotor flushing     Breast pain, right; abscess (possible) Acute right breast pain with tenderness and heaviness. Differential includes infection, abscess, or other pathology.  Mammogram last year showed dense tissue. No family history of breast cancer, but benign cysts present. - Order diagnostic mammogram. - Order prolactin level. - Order complete blood count to rule out infection.  Headaches; vasomotor flushing Recent onset of hot flashes, along with increased frequency of headaches on top of usual migraines, possibly related to menopausal symptoms.  Depending on etiology, may also be related to breast pain due to hormone fluctuation. - Further evaluation if symptoms persist or worsen.    Return in about 4 weeks (around 02/28/2024), or if symptoms worsen or fail to improve, for Anx/Dep w/PCP.      I discussed the assessment and treatment plan with the patient  The patient was provided an opportunity to ask questions and all were answered. The patient agreed with the plan and demonstrated an understanding of the instructions.   The patient was advised to call back or seek an in-person evaluation if the symptoms worsen or if the condition fails to improve as anticipated.    LAURAINE LOISE BUOY, DO  [facility] [phone]  (phone) [phone] (fax)  [facility]

## 2024-01-31 NOTE — Telephone Encounter (Signed)
 FYI Only or Action Required?: FYI only for provider.  Patient was last seen in primary care on 12/25/2023 by Gretel App, NP.  Called Nurse Triage reporting mass is new,Breast Mass. Tender /pain 1 week.  Symptoms began a week ago.  Interventions attempted: Nothing.  Symptoms are: gradually worsening.  Triage Disposition: See PCP When Office is Open (Within 3 Days)  Patient/caregiver understands and will follow disposition?: Yes - appt today at [facility]              Copied from CRM #[id number]. Topic: Clinical - Red Word Triage >> Jan 31, 2024  8:39 AM [place] A wrote: [facility] that prompted transfer to Nurse Triage: Patient has pain and discomfort on her right breast. Felt lump this morning but pain has been for a week. Reason for Disposition  Breast lump  Answer Assessment - Initial Assessment Questions 1. SYMPTOM: What's the main symptom you're concerned about?  (e.g., lump, nipple discharge, pain, rash)     Pain  and lump 2. LOCATION: Where is the pain and lump located?     Right breast 3. ONSET: When did discomfort  start?     1 week 4. PRIOR HISTORY: Do you have any history of prior problems with your breasts? (e.g., breast cancer, breast implant, fibrocystic breast disease)     No - mastitis when breast feeding 5. CAUSE: What do you think is causing this symptom?     no 6. OTHER SYMPTOMS: Do you have any other symptoms? (e.g., breast pain, fever, nipple discharge, redness or rash)     Breast pain 7. PREGNANCY-BREASTFEEDING: Is there any chance you are pregnant? When was your last menstrual period? Are you breastfeeding?     no  Protocols used: Breast Symptoms-A-AH

## 2024-01-31 NOTE — Telephone Encounter (Signed)
 Noted. Thanks.

## 2024-01-31 NOTE — Telephone Encounter (Signed)
Please notify pt.  Thanks.   

## 2024-02-03 ENCOUNTER — Other Ambulatory Visit: Payer: Self-pay | Admitting: Family Medicine

## 2024-02-04 LAB — CBC WITH DIFFERENTIAL/PLATELET
Basophils Absolute: 0 x10E3/uL (ref 0.0–0.2)
Basos: 1 %
EOS (ABSOLUTE): 0.1 x10E3/uL (ref 0.0–0.4)
Eos: 1 %
Hematocrit: 35.2 % (ref 34.0–46.6)
Hemoglobin: 11.4 g/dL (ref 11.1–15.9)
Immature Grans (Abs): 0 x10E3/uL (ref 0.0–0.1)
Immature Granulocytes: 0 %
Lymphocytes Absolute: 3.1 x10E3/uL (ref 0.7–3.1)
Lymphs: 36 %
MCH: 27.7 pg (ref 26.6–33.0)
MCHC: 32.4 g/dL (ref 31.5–35.7)
MCV: 86 fL (ref 79–97)
Monocytes Absolute: 0.5 x10E3/uL (ref 0.1–0.9)
Monocytes: 6 %
Neutrophils Absolute: 5 x10E3/uL (ref 1.4–7.0)
Neutrophils: 55 %
Platelets: 291 x10E3/uL (ref 150–450)
RBC: 4.11 x10E6/uL (ref 3.77–5.28)
RDW: 12.6 % (ref 11.7–15.4)
WBC: 8.7 x10E3/uL (ref 3.4–10.8)

## 2024-02-04 LAB — PROLACTIN: Prolactin: 15.9 ng/mL (ref 4.8–33.4)

## 2024-02-07 ENCOUNTER — Ambulatory Visit
Admission: RE | Admit: 2024-02-07 | Discharge: 2024-02-07 | Disposition: A | Discharge status: Home / Self Care | Source: Ambulatory Visit | Attending: Family Medicine | Admitting: Family Medicine

## 2024-02-07 ENCOUNTER — Ambulatory Visit: Payer: Self-pay | Admitting: Family Medicine

## 2024-02-07 DIAGNOSIS — N644 Mastodynia: Secondary | ICD-10-CM

## 2024-02-21 ENCOUNTER — Ambulatory Visit: Payer: Self-pay | Admitting: Family Medicine

## 2024-04-03 NOTE — Telephone Encounter (Signed)
 FYI Only or Action Required?: FYI only for provider: appointment scheduled on 04/06/24.  Patient was last seen in primary care on 01/31/2024 by Donzella Lauraine SAILOR, DO.  Called Nurse Triage reporting Tics and Headache.  Symptoms began about a month ago.  Interventions attempted: OTC medications: Tylenol , ibuprofen.  Symptoms are: gradually worsening.  Triage Disposition: See Within 3 Days in Office (overriding See Physician Within 24 Hours)  Patient/caregiver understands and will follow disposition?: Yes             Copied from CRM #[id number]. Topic: Clinical - Red Word Triage >> Apr 03, 2024 12:59 PM Franky GRADE wrote: Red Word that prompted transfer to Nurse Triage: Developed a tourettes like tick that started in November, patient believed it was stressed inducded and will improve over time; however, they have gotten worse and affecting her day to day, she has also started to develope severe headaches. Reason for Disposition  [1] MODERATE headache (e.g., interferes with normal activities) AND [2] present > 24 hours AND [3] unexplained  (Exceptions: Pain medicines not tried, typical migraine, or headache part of viral illness.)  Answer Assessment - Initial Assessment Questions 1. LOCATION: Where does it hurt?      Front, right. Just past forehead.  2. ONSET: When did the headache start? (e.g., minutes, hours, days)      Daily; x 1 month.  3. PATTERN: Does the pain come and go, or has it been constant since it started?     She states they will go away but come back each day.  4. SEVERITY: How bad is the pain? and What does it keep you from doing?  (e.g., Scale 1-10; mild, moderate, or severe)     5-6/10, took 600mg  ibuprofen about an hour ago.  5. RECURRENT SYMPTOM: Have you ever had headaches before? If Yes, ask: When was the last time? and What happened that time?      Yes, last migraine was about 6 months ago.  6. CAUSE: What do you think is causing the  headache?     Unsure. She states she thought it was caused by stress.  7. MIGRAINE: Have you been diagnosed with migraine headaches? If Yes, ask: Is this headache similar?      Yes. She states this doesn't feel like her typical migraine.  8. HEAD INJURY: Has there been any recent injury to your head?      No.  9. OTHER SYMPTOMS: Do you have any other symptoms? (e.g., fever, stiff neck, eye pain, sore throat, cold symptoms)     Tics x 1 month describes it as a muffled sneeze sound. Denies any fever, stiff neck, unilateral numbness or weakness, changes in speech or vision, SOB, chest pain, nausea, vomiting, diarrhea.  10. PREGNANCY: Is there any chance you are pregnant? When was your last menstrual period?       LMP 10 days ago.  Protocols used: [facility]

## 2024-04-03 NOTE — Telephone Encounter (Signed)
 I spoke with pt; pt said for at least 1 month  on and off dull h/A at forehead pain level 5   No CP,SOB, or dizziness. Pt already has appt with Dr Cleatus on 04/06/24 and I gave pt UC & ED precautions which pt voiced understanding and pt said she is not in any distress at this time. Sending note to Dr Cleatus.

## 2024-04-06 ENCOUNTER — Ambulatory Visit: Admitting: Family Medicine

## 2024-04-06 NOTE — Telephone Encounter (Signed)
 Thank you for getting patient rescheduled.

## 2024-04-07 ENCOUNTER — Ambulatory Visit: Admitting: Family Medicine

## 2024-04-13 ENCOUNTER — Encounter: Payer: Self-pay | Admitting: Family Medicine

## 2024-04-13 ENCOUNTER — Ambulatory Visit: Admitting: Family Medicine

## 2024-04-13 VITALS — BP 126/78 | HR 75 | Temp 98.4°F | Ht 59.0 in | Wt 188.1 lb

## 2024-04-13 DIAGNOSIS — G43109 Migraine with aura, not intractable, without status migrainosus: Secondary | ICD-10-CM | POA: Diagnosis not present

## 2024-04-13 DIAGNOSIS — F959 Tic disorder, unspecified: Secondary | ICD-10-CM | POA: Insufficient documentation

## 2024-04-13 DIAGNOSIS — G4733 Obstructive sleep apnea (adult) (pediatric): Secondary | ICD-10-CM | POA: Diagnosis not present

## 2024-04-13 DIAGNOSIS — F411 Generalized anxiety disorder: Secondary | ICD-10-CM

## 2024-04-13 DIAGNOSIS — R519 Headache, unspecified: Secondary | ICD-10-CM | POA: Insufficient documentation

## 2024-04-13 NOTE — Assessment & Plan Note (Signed)
 Using cpap reliably

## 2024-04-13 NOTE — Assessment & Plan Note (Signed)
 With migraine features New No aura  See a/p for migraine

## 2024-04-13 NOTE — Assessment & Plan Note (Addendum)
 Complex patient with history of migraine with aura-now with new daily headache without aura (with migraine features-unilateral and nausea and photophobia) but not her usual/standard migraine  With osa treated with cpap, also history of mood disorder and ADHD but not currently on medication  ? If worse due to stress In conjunction with new verbal tic as well  Responding briefly to tylenol /ibuprofen and caffeine  Intol of zomig  and imitrex  in past  Note pt took gabapentin  in 2021 but pt is unsure what for or if it worked  Tried propranolol  and per pt caused increase blood pressure instead of decrease  Reviewed records today  Exam is non focal but verbal tic present throughout interview   In light of change in headache and new symptoms-would like to get brain imaging (mri if approved) Order in  Will discuss further management with Dr Cleatus Capra pt may need neurology referral in future   I personally spent a total of 33 minutes in the care of the patient today including preparing to see the patient, getting/reviewing separately obtained history, performing a medically appropriate exam/evaluation, counseling and educating, placing orders, referring and communicating with other health care professionals, and documenting clinical information in the EHR.

## 2024-04-13 NOTE — Progress Notes (Signed)
 "  Subjective:    Patient ID: Crystal Hammond, female    DOB: 07/13/1980, 43 y.o.   MRN: [id number]  HPI  Wt Readings from Last 3 Encounters:  04/13/24 188 lb 2 oz (85.3 kg)  01/31/24 188 lb 11.2 oz (85.6 kg)  12/25/23 184 lb 12.8 oz (83.8 kg)   38.00 kg/m  Vitals:   04/13/24 1357  BP: 126/78  Pulse: 75  Temp: 98.4 F (36.9 C)  SpO2: 100%     43 yo pt of Dr Crystal presents with c/o Headache Tic   Pmx notable for  Migraine with aura OSA GAD and depressive disorder (buspar , prozac  in past)  Agoraphobia- past  ADHD  At least one month  Intermittent dull headache -forehead  Does not feel like her typical migraine   Daily headache for at least a mo  Some days wake her up from sleep  Some days wake up with  Can also happen in afternoon   Right sided  Above brow going to top of head  Constant-not throbbing  Some light sensitivity  Not sound sensitive  Had n/v - xmas morning-once   ? How long   Over the counter  Tylenol  Ibupfrofen  Helps temporarily   Had a few bouts of vertigo before xmas   Zomig  caused aches   Osa-uses cpap nightly and for naps    At tourettes like tic -started in November  Happens all day long (affecting her life)  Can interfere with conversations   ? Stress induced  Gets worse around certain people (more stressful interactions- over holiday)  Is a muffled sneeze sound   Not currently taking her adderall  -was not helping   Propranolol  in past -did not help headache and made blood pressure high instead of lowering it    2 deaths in family around holidays unfortunately  Never had a tic in the past  Son had a tic in past-due to medication - adhd med     Father has parkinson's     High stress level all the time for past 3 y  Brother died 3 y ago (mva)   She did some therapy- did not like her therapist  She reads books instead   Self care is decent  Exercise was better before holidays      01/31/2024    1:44  PM 06/10/2023    4:09 PM 03/07/2023   11:23 AM 12/14/2022   12:03 PM 12/04/2022   11:59 AM  Depression screen PHQ 2/9  Decreased Interest 3 1 3 2 1   Down, Depressed, Hopeless 3 1 3 3 1   PHQ - 2 Score 6 2 6 5 2   Altered sleeping 3 0 2 3 2   Tired, decreased energy 3 3 3 3 3   Change in appetite 2 1 3 2 1   Feeling bad or failure about yourself  3 2 3 3 3   Trouble concentrating 3 2 3 3 2   Moving slowly or fidgety/restless 0 0 0 0 0  Suicidal thoughts 0 0 0 0 0  PHQ-9 Score 20  10  20  19  13    Difficult doing work/chores Very difficult Somewhat difficult Extremely dIfficult Very difficult Very difficult     Data saved with a previous flowsheet row definition      01/31/2024    1:44 PM 06/10/2023    4:09 PM 03/07/2023   11:24 AM 12/14/2022   12:03 PM  GAD 7 : Generalized Anxiety Score  Nervous, Anxious, on  Edge 3 3 3 3   Control/stop worrying 3 3 3 3   Worry too much - different things 3 3 3 3   Trouble relaxing 3 3 3 3   Restless 1 2 0 2  Easily annoyed or irritable 3 2 3 3   Afraid - awful might happen 3 3 3 3   Total GAD 7 Score 19 19 18 20   Anxiety Difficulty Very difficult Very difficult Extremely difficult Extremely difficult    Lab Results  Component Value Date   NA 139 11/25/2023   K 4.0 11/25/2023   CO2 29 11/25/2023   GLUCOSE 81 11/25/2023   BUN 8 11/25/2023   CREATININE 0.64 11/25/2023   CALCIUM 9.4 11/25/2023   GFR 109.72 11/25/2023   GFRNONAA >60 02/04/2023    Cannot have topamax due to history of renal stones  Took gabapentin  in 2021=not sure for     Patient Active Problem List   Diagnosis Date Noted   Tic disorder 04/13/2024   Ovarian cyst, right 05/09/2023   Right flank pain 05/07/2023   Generalized abdominal pain 05/07/2023   Left arm swelling 01/10/2023   Renal stone 12/14/2022   Microscopic hematuria 11/08/2022   Abnormal urinalysis 11/08/2022   IUD (intrauterine device) in place 11/05/2022   Loss of height 10/10/2022   Back pain 09/12/2022    Endometriosis 08/24/2022   History of gestational diabetes 08/24/2022   FH: genetic disorder 06/10/2022   Asthmatic bronchitis 01/30/2022   Bilateral carpal tunnel syndrome 07/31/2021   Advance care planning 07/09/2021   OSA (obstructive sleep apnea) 07/09/2021   Routine general medical examination at a health care facility 01/15/2021   Vitamin D  deficiency 01/26/2020   Incisional hernia 12/11/2019   Prediabetes 07/26/2019   GAD (generalized anxiety disorder) 06/25/2019   Adult ADHD 06/25/2019   Depressive disorder 06/25/2019   Family history of congenital anomalies 05/27/2017   Herpes 05/27/2017   Hx of cesarean section 05/27/2017   Migraine with aura 05/27/2017   Agoraphobia 05/30/2016   History of abnormal cervical Pap smear 01/31/2016   History of kidney stones 01/31/2016   Past Medical History:  Diagnosis Date   Allergy     Anxiety    Cervical dysplasia    Complication of anesthesia    LOW BLOOD PRESSURE   Depression    Endometriosis    FH: genetic disorder 06/10/2022   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)   Heart murmur    OUTGREW   History of febrile seizure    History of kidney stones    History of pneumonia    Kidney stone    Left nephrolithiasis    Migraines    OCD (obsessive compulsive disorder)    Oral herpes    Pneumonia    PONV (postoperative nausea and vomiting)    Postpartum depression    Pre-diabetes    Psoriasis    PTSD (post-traumatic stress disorder)    Recurrent UTI    Rosacea    Sleep apnea    Strep pharyngitis 06/29/2022   Past Surgical History:  Procedure Laterality Date   ABDOMINAL SURGERY  2012   Endometrial Surgery - went through c-section scar   CARPAL TUNNEL RELEASE Right 08/07/2021   Procedure: RIGHT CARPAL TUNNEL RELEASE;  Surgeon: Romona Harari, MD;  Location: [facility];  Service: Orthopedics;  Laterality: Right;   CARPAL TUNNEL RELEASE Left 08/30/2021   Procedure: LEFT CARPAL TUNNEL RELEASE;  Surgeon: Romona Harari, MD;  Location: [facility];  Service: Orthopedics;  Laterality: Left;  CESAREAN SECTION  2019   CESAREAN SECTION  2007   CESAREAN SECTION  2014   CESAREAN SECTION  2016   CYSTOSCOPY/URETEROSCOPY/HOLMIUM LASER/STENT PLACEMENT Left 02/11/2023   Procedure: CYSTOSCOPY/URETEROSCOPY/HOLMIUM LASER/STENT PLACEMENT;  Surgeon: Penne Knee, MD;  Location: [facility];  Service: Urology;  Laterality: Left;   ENDOMETRIAL BIOPSY  2011   EXTRACORPOREAL SHOCK WAVE LITHOTRIPSY Left 12/27/2022   Procedure: EXTRACORPOREAL SHOCK WAVE LITHOTRIPSY (ESWL);  Surgeon: Francisca Redell BROCKS, MD;  Location: [facility];  Service: Urology;  Laterality: Left;   GASTROSCHISIS CLOSURE     LEEP     x 2   VENTRAL HERNIA REPAIR N/A 12/11/2019   Procedure: HERNIA REPAIR VENTRAL ADULT;  Surgeon: Rodolph Romano, MD;  Location: [facility];  Service: General;  Laterality: N/A;   WISDOM TOOTH EXTRACTION     XI ROBOTIC ASSISTED VENTRAL HERNIA N/A 12/11/2019   Procedure: XI ROBOTIC ASSISTED VENTRAL HERNIA Lap vs open;  Surgeon: Rodolph Romano, MD;  Location: [facility];  Service: General;  Laterality: N/A;   Social History[1] Family History  Problem Relation Age of Onset   Depression Mother    Alcohol abuse Mother    Hernia Mother    Rashes / Skin problems Mother    Thyroid  disease Mother    Anxiety disorder Mother    Depression Father    Alcohol abuse Father    Gout Father    Diabetes Father    Asthma Brother    Bipolar disorder Brother    Drug abuse Brother    ADD / ADHD Brother    Birth defects Brother    Colon cancer Paternal Grandmother    Diabetes Paternal Grandmother    Allergic rhinitis Son    Asthma Son    ADD / ADHD Son    Allergic rhinitis Son    ADD / ADHD Son    Breast cancer Maternal Aunt    Lung cancer Maternal Aunt    Diabetes Maternal Grandmother    Diabetes Paternal Grandfather    Obesity Paternal Aunt    Ovarian cancer Neg Hx    Allergies[2] Medications Ordered  Prior to Encounter[3]  Review of Systems  Constitutional:  Positive for fatigue. Negative for activity change, appetite change, fever and unexpected weight change.  HENT:  Negative for congestion, ear pain, rhinorrhea, sinus pressure and sore throat.   Eyes:  Negative for pain, redness and visual disturbance.  Respiratory:  Negative for cough, shortness of breath and wheezing.   Cardiovascular:  Negative for chest pain and palpitations.  Gastrointestinal:  Negative for abdominal pain, blood in stool, constipation and diarrhea.  Endocrine: Negative for polydipsia and polyuria.  Genitourinary:  Negative for dysuria, frequency and urgency.  Musculoskeletal:  Negative for arthralgias, back pain and myalgias.  Skin:  Negative for pallor and rash.  Allergic/Immunologic: Negative for environmental allergies.  Neurological:  Positive for headaches. Negative for dizziness, tremors, seizures, syncope, facial asymmetry, speech difficulty, weakness, light-headedness and numbness.       Not dizzy today but has had vertigo on and off   Hematological:  Negative for adenopathy. Does not bruise/bleed easily.  Psychiatric/Behavioral:  Positive for decreased concentration and dysphoric mood. Negative for behavioral problems, confusion and self-injury. The patient is nervous/anxious.        Objective:   Physical Exam Constitutional:      General: She is not in acute distress.    Appearance: She is well-developed. She is obese. She is not ill-appearing or diaphoretic.  HENT:  Head: Normocephalic and atraumatic.     Right Ear: External ear normal.     Left Ear: External ear normal.     Nose: Nose normal.     Mouth/Throat:     Pharynx: No oropharyngeal exudate.  Eyes:     General: No scleral icterus.       Right eye: No discharge.        Left eye: No discharge.     Conjunctiva/sclera: Conjunctivae normal.     Pupils: Pupils are equal, round, and reactive to light.     Comments: No nystagmus   Neck:     Thyroid : No thyromegaly.     Vascular: No carotid bruit or JVD.     Trachea: No tracheal deviation.  Cardiovascular:     Rate and Rhythm: Normal rate and regular rhythm.     Heart sounds: Normal heart sounds. No murmur Hammond. Pulmonary:     Effort: Pulmonary effort is normal. No respiratory distress.     Breath sounds: Normal breath sounds. No wheezing or rales.  Abdominal:     General: Bowel sounds are normal. There is no distension.     Palpations: Abdomen is soft. There is no mass.     Tenderness: There is no abdominal tenderness.  Musculoskeletal:        General: No tenderness.     Cervical back: Full passive range of motion without pain, normal range of motion and neck supple.  Lymphadenopathy:     Cervical: No cervical adenopathy.  Skin:    General: Skin is warm and dry.     Coloration: Skin is not pale.     Findings: No rash.  Neurological:     General: No focal deficit present.     Mental Status: She is alert and oriented to person, place, and time.     Cranial Nerves: No cranial nerve deficit, dysarthria or facial asymmetry.     Sensory: Sensation is intact. No sensory deficit.     Motor: No weakness, tremor, atrophy, abnormal muscle tone, seizure activity or pronator drift.     Coordination: Romberg sign negative. Coordination normal. Finger-Nose-Finger Test and Heel to [facility] Test normal.     Gait: Gait normal.     Deep Tendon Reflexes: Reflexes are normal and symmetric. Reflexes normal.     Comments: No focal cerebellar signs     Psychiatric:        Attention and Perception: Attention normal.        Mood and Affect: Mood is anxious.        Speech: Speech normal. Speech is not slurred.        Behavior: Behavior normal.        Thought Content: Thought content normal.        Cognition and Memory: Cognition and memory normal.     Comments: Verbal tic noted frequently  Sounds like a sneeze without sneezing  Pt is able to talk through it   Candidly  discusses symptoms and stressors             Assessment & Plan:   Problem List Items Addressed This Visit       Cardiovascular and Mediastinum   Migraine with aura - Primary   Complex patient with history of migraine with aura-now with new daily headache without aura (with migraine features-unilateral and nausea and photophobia) but not her usual/standard migraine  With osa treated with cpap, also history of mood disorder and ADHD but not currently on medication  ?  If worse due to stress In conjunction with new verbal tic as well  Responding briefly to tylenol /ibuprofen and caffeine  Intol of zomig  and imitrex  in past  Note pt took gabapentin  in 2021 but pt is unsure what for or if it worked  Tried propranolol  and per pt caused increase blood pressure instead of decrease  Reviewed records today  Exam is non focal but verbal tic present throughout interview   In light of change in headache and new symptoms-would like to get brain imaging (mri if approved) Order in  Will discuss further management with Dr Crystal Hammond pt may need neurology referral in future         Respiratory   OSA (obstructive sleep apnea)   Using cpap reliably         Nervous and Auditory   Tic disorder   New  In setting of untreated ADHD, depression and anxiety Also headache disorder with recent change and worsening  Also high stress level (recent losses in family and also lost brother 91 y ago)   ? If mental health related vs neurologic Of interest-son had tic on adhd med that resolved off of it   Pt notes she disliked her last counselor and is open to a new one   Reassuring exam  Plan to obtain imaging for headache and discuss case with pcp         Other   GAD (generalized anxiety disorder)   No current medicines  Also having some verbal tics   Stress is high but per pt not as high as in the past          [1]  Social History Tobacco Use   Smoking status: Former     Current packs/day: 0.00    Average packs/day: 0.3 packs/day for 3.0 years (0.9 ttl pk-yrs)    Types: Cigarettes    Start date: 2010    Quit date: 2013    Years since quitting: 13.0    Passive exposure: Past   Smokeless tobacco: Never  Vaping Use   Vaping status: Never Used  Substance Use Topics   Alcohol use: Not Currently    Alcohol/week: 2.0 standard drinks of alcohol    Types: 2 Cans of beer per week   Drug use: Never  [2]  Allergies Allergen Reactions   Other Swelling    SHELLFISH    Phenazopyridine Hives and Rash   Benadryl  [Diphenhydramine ]    Prednisone      Intolerant- skin feels hot   Zomig  [Zolmitriptan ]     Diffuse aches.    [3]  Current Outpatient Medications on File Prior to Visit  Medication Sig Dispense Refill   albuterol  (PROVENTIL ) (2.5 MG/3ML) 0.083% nebulizer solution Inhale 3 mLs (2.5 mg total) by nebulization every 6 (six) hours as needed for wheezing or shortness of breath. 150 mL 1   albuterol  (VENTOLIN  HFA) 108 (90 Base) MCG/ACT inhaler Inhale 2 puffs into the lungs every 6 (six) hours as needed for wheezing or shortness of breath. 18 g 0   brompheniramine-pseudoephedrine-DM 30-2-10 MG/5ML syrup Take 5-10 mLs by mouth every 6 (six) hours as needed. 120 mL 0   levonorgestrel  (MIRENA ) 20 MCG/DAY IUD 1 each by Intrauterine route once.     No current facility-administered medications on file prior to visit.   "

## 2024-04-13 NOTE — Assessment & Plan Note (Addendum)
 New  In setting of untreated ADHD, depression and anxiety Also headache disorder with recent change and worsening  Also high stress level (recent losses in family and also lost brother 16 y ago)   ? If mental health related vs neurologic Of interest-son had tic on adhd med that resolved off of it   Pt notes she disliked her last counselor and is open to a new one   Reassuring exam  Plan to obtain imaging for headache and discuss case with pcp

## 2024-04-13 NOTE — Assessment & Plan Note (Addendum)
 No current medicines  Also having some verbal tics   Stress is high but per pt not as high as in the past

## 2024-04-13 NOTE — Patient Instructions (Signed)
 I want to order some imaging of your head/brain   I put the referral in for MRI  Please let us  know if you don't hear in 1-2 weeks to set that up (mychart message or call or letter)    Ibuprofen and caffeine are ok for headache  Avoid caffeine unless you have a headache    Continue your cpap   I will discuss case with Dr Cleatus

## 2024-04-22 ENCOUNTER — Encounter: Payer: Self-pay | Admitting: Family Medicine

## 2024-04-27 ENCOUNTER — Telehealth: Payer: Self-pay

## 2024-04-27 ENCOUNTER — Other Ambulatory Visit

## 2024-04-27 NOTE — Telephone Encounter (Signed)
 Copied from CRM #[id number]. Topic: Clinical - Medical Advice >> Apr 24, 2024  1:23 PM Victoria A wrote: Reason for CRM: Patient would like to know if she will be prescribed medication for her MRI on Monday @ 9:00AM or will she be sedated-please call patient [phone]

## 2024-04-27 NOTE — Telephone Encounter (Signed)
 Unable to reach patient by phone and left v/m requesting call back at [phone].per appt notes appt for MRI cancelled on 04/27/24 due to pt being sick and rescheduled for 05/07/24 at 2:40.Sending note to Dr Cleatus and Cleatus pool. E2C2 can triage pt and get answers to Dr Cleatus questions below.

## 2024-04-27 NOTE — Telephone Encounter (Signed)
 Does she require med for claustrophobia?  Does she have a driver lined up?

## 2024-04-27 NOTE — Telephone Encounter (Signed)
 Noted. Thanks. Please see about getting the info below if patient doesn't call back soon.

## 2024-04-28 NOTE — Telephone Encounter (Signed)
 Left message to return call to office. Ok to [employer] below.

## 2024-04-29 NOTE — Telephone Encounter (Signed)
 Mychart message sent to patient.

## 2024-05-07 ENCOUNTER — Ambulatory Visit: Payer: Self-pay | Admitting: Family Medicine

## 2024-05-07 ENCOUNTER — Ambulatory Visit
Admission: RE | Admit: 2024-05-07 | Discharge: 2024-05-07 | Disposition: A | Discharge status: Home / Self Care | Source: Ambulatory Visit | Attending: Family Medicine | Admitting: Family Medicine

## 2024-05-07 DIAGNOSIS — R519 Headache, unspecified: Secondary | ICD-10-CM

## 2024-05-07 DIAGNOSIS — F959 Tic disorder, unspecified: Secondary | ICD-10-CM

## 2024-05-07 NOTE — Telephone Encounter (Signed)
 FYI Only or Action Required?: FYI only for provider: ED advised.  Patient was last seen in primary care on 04/13/2024 by Crystal Hammond LABOR, MD.  Called Nurse Triage reporting Headache.  Symptoms began today.  Interventions attempted: OTC medications: tylenol , advil.  Symptoms are: unchanged.  Triage Disposition: See HCP Within 4 Hours (Or PCP Triage)  Patient/caregiver understands and will follow disposition?: No, refuses disposition   Summary: Head pain   Reason for Triage: Worsening headache.          Reason for Disposition  [1] SEVERE headache (e.g., excruciating) AND [2] not improved after 2 hours of pain medicine  Answer Assessment - Initial Assessment Questions [facility] in [place]  [phone]- husband Crystal Hammond  Requesting call back from provider and medication for pain/migraine management  MRI cancelled for today as facility does not perform MRI under sedation, refuse ED or UC    1. LOCATION: Where does it hurt?      Front of head 2. ONSET: When did the headache start? (e.g., minutes, hours, days)      Daily headaches/migraines since November, 2025 Today worse than most 3. PATTERN: Does the pain come and go, or has it been constant since it started?     constant 4. SEVERITY: How bad is the pain? and What does it keep you from doing?  (e.g., Scale 1-10; mild, moderate, or severe)     7/10 5. RECURRENT SYMPTOM: Have you ever had headaches before? If Yes, ask: When was the last time? and What happened that time?      Yes.  Current treatment for headaches 6. CAUSE: What do you think is causing the headache?     unsure 7. MIGRAINE: Have you been diagnosed with migraine headaches? If Yes, ask: Is this headache similar?      Yes, diagnosed with migraines in the past 8. HEAD INJURY: Has there been any recent injury to your head?      Denies head injury 9. OTHER SYMPTOMS: Do you have any other symptoms? (e.g., fever, stiff neck, eye pain,  sore throat, cold symptoms)     denies 10. PREGNANCY: Is there any chance you are pregnant? When was your last menstrual period?       Denies, has IUD.  Husband has also had a procedure  Protocols used: Headache-A-AH

## 2024-05-08 NOTE — Telephone Encounter (Signed)
 Are most of the MRIs in the area at least semi open?  Is there one location more open than another ?   We could try valium  if she has a driver   Let me know   Please also get follow up with pcp on the books -thanks

## 2024-05-08 NOTE — Telephone Encounter (Signed)
 So confused- MRI with sedation? Does she require that ?  Not aware of that   I ordered the MRI at end of December   Glad her headache is a bit better   Thanks for pcp input    I cannot order general sedation for mri  Would she do ok with xanax ?  Something to relax her

## 2024-05-08 NOTE — Telephone Encounter (Signed)
 I spoke with pt; Pt notified as instructed by dr Cleatus and pt voiced understanding.lpt still has H/A this morning but it is not as bad as yesterday. Now pain level is 3. Pt vomited x 1 last night but pt is able to drink so does not feel dehydrated. Pt said she is not dizzy but is very light sensitive. No available appts at [facility] or [facility] I offered pt appt at [facility] but pt declined and said if she worsened she would go to UC.pt has not checked BP yet. UC & ED precautions given and pt voiced understanding. Sending note to Dr Cleatus and Cleatus pool,.

## 2024-05-08 NOTE — Telephone Encounter (Signed)
 This message was not routed to me yesterday.  It was routed to me this AM, shortly before I responded here.   She was intolerant of zomig  and imitrex  in past.  If she is still having severe HA, then the best option would be ER.    Please check on patient.

## 2024-05-08 NOTE — Telephone Encounter (Signed)
 Called pt and she said that she did go to appt but she was so claustrophobic that no matter what they did to try to get her on the table and the mask over her head it didn't work. Pt said she doesn't think xanax will touch her anxiety. Pt said [place] and they basically had to give her meds to knock her out on the table. Pt asked if there is any Open MRI's around that she could try that instead because if not she isn't sure if there is a very strong med that she could even try to take to get this MRI done  FYI to PCP and Dr. Randeen

## 2024-05-08 NOTE — Telephone Encounter (Addendum)
 Noted. Thanks.   I routed this to referrals to see about rescheduling her MRI.

## 2024-05-13 NOTE — Telephone Encounter (Signed)
 Please let me know if she can't get the open MRI done at [facility].  Thanks for your help.
